# Patient Record
Sex: Female | Born: 1965 | Race: White | Hispanic: No | Marital: Married | Smoking: Never smoker
Health system: Southern US, Community
[De-identification: ages and names within clinical notes are randomized; demographics above are authoritative.]

## PROBLEM LIST (undated history)

## (undated) DIAGNOSIS — Z8632 Personal history of gestational diabetes: Secondary | ICD-10-CM

## (undated) DIAGNOSIS — R002 Palpitations: Secondary | ICD-10-CM

## (undated) DIAGNOSIS — K810 Acute cholecystitis: Secondary | ICD-10-CM

## (undated) DIAGNOSIS — Z87410 Personal history of cervical dysplasia: Secondary | ICD-10-CM

## (undated) DIAGNOSIS — R0602 Shortness of breath: Secondary | ICD-10-CM

## (undated) DIAGNOSIS — M94269 Chondromalacia, unspecified knee: Secondary | ICD-10-CM

## (undated) DIAGNOSIS — A071 Giardiasis [lambliasis]: Secondary | ICD-10-CM

## (undated) DIAGNOSIS — I10 Essential (primary) hypertension: Secondary | ICD-10-CM

## (undated) DIAGNOSIS — Z86018 Personal history of other benign neoplasm: Secondary | ICD-10-CM

## (undated) HISTORY — PX: GYNECOLOGIC CRYOSURGERY: SHX857

## (undated) HISTORY — DX: Acute cholecystitis: K81.0

## (undated) HISTORY — DX: Giardiasis (lambliasis): A07.1

## (undated) HISTORY — DX: Palpitations: R00.2

## (undated) HISTORY — PX: COLPOSCOPY: SHX161

## (undated) HISTORY — DX: Shortness of breath: R06.02

## (undated) HISTORY — DX: Essential (primary) hypertension: I10

---

## 2000-04-02 ENCOUNTER — Inpatient Hospital Stay (HOSPITAL_COMMUNITY): Admission: AD | Admit: 2000-04-02 | Discharge: 2000-04-02 | Payer: Self-pay | Admitting: Obstetrics & Gynecology

## 2000-04-02 ENCOUNTER — Encounter: Payer: Self-pay | Admitting: Obstetrics & Gynecology

## 2002-11-15 ENCOUNTER — Other Ambulatory Visit: Admission: RE | Admit: 2002-11-15 | Discharge: 2002-11-15 | Payer: Self-pay | Admitting: Obstetrics and Gynecology

## 2002-11-24 ENCOUNTER — Ambulatory Visit (HOSPITAL_COMMUNITY): Admission: RE | Admit: 2002-11-24 | Discharge: 2002-11-24 | Payer: Self-pay | Admitting: Obstetrics and Gynecology

## 2002-11-24 ENCOUNTER — Encounter: Payer: Self-pay | Admitting: Obstetrics and Gynecology

## 2003-11-17 ENCOUNTER — Other Ambulatory Visit: Admission: RE | Admit: 2003-11-17 | Discharge: 2003-11-17 | Payer: Self-pay | Admitting: Obstetrics and Gynecology

## 2003-12-29 ENCOUNTER — Observation Stay (HOSPITAL_COMMUNITY): Admission: RE | Admit: 2003-12-29 | Discharge: 2003-12-30 | Payer: Self-pay | Admitting: Obstetrics and Gynecology

## 2003-12-29 ENCOUNTER — Encounter (INDEPENDENT_AMBULATORY_CARE_PROVIDER_SITE_OTHER): Payer: Self-pay | Admitting: Specialist

## 2003-12-29 HISTORY — PX: MYOMECTOMY ABDOMINAL APPROACH: SUR870

## 2004-02-13 ENCOUNTER — Ambulatory Visit (HOSPITAL_COMMUNITY): Admission: RE | Admit: 2004-02-13 | Discharge: 2004-02-13 | Payer: Self-pay | Admitting: Obstetrics and Gynecology

## 2004-05-20 ENCOUNTER — Ambulatory Visit (HOSPITAL_COMMUNITY): Admission: RE | Admit: 2004-05-20 | Discharge: 2004-05-20 | Payer: Self-pay | Admitting: Neurology

## 2005-05-17 ENCOUNTER — Other Ambulatory Visit: Admission: RE | Admit: 2005-05-17 | Discharge: 2005-05-17 | Payer: Self-pay | Admitting: Gynecology

## 2005-05-22 ENCOUNTER — Inpatient Hospital Stay (HOSPITAL_COMMUNITY): Admission: AD | Admit: 2005-05-22 | Discharge: 2005-05-22 | Payer: Self-pay | Admitting: Gynecology

## 2005-09-13 ENCOUNTER — Encounter: Admission: RE | Admit: 2005-09-13 | Discharge: 2005-09-13 | Payer: Self-pay | Admitting: Gynecology

## 2005-11-12 ENCOUNTER — Inpatient Hospital Stay (HOSPITAL_COMMUNITY): Admission: AD | Admit: 2005-11-12 | Discharge: 2005-11-12 | Payer: Self-pay | Admitting: Gynecology

## 2005-11-16 ENCOUNTER — Inpatient Hospital Stay (HOSPITAL_COMMUNITY): Admission: RE | Admit: 2005-11-16 | Discharge: 2005-11-19 | Payer: Self-pay | Admitting: Gynecology

## 2005-11-16 ENCOUNTER — Encounter (INDEPENDENT_AMBULATORY_CARE_PROVIDER_SITE_OTHER): Payer: Self-pay | Admitting: Specialist

## 2005-11-20 ENCOUNTER — Encounter: Admission: RE | Admit: 2005-11-20 | Discharge: 2005-12-19 | Payer: Self-pay | Admitting: Gynecology

## 2005-12-20 ENCOUNTER — Encounter: Admission: RE | Admit: 2005-12-20 | Discharge: 2006-01-19 | Payer: Self-pay | Admitting: Gynecology

## 2005-12-28 ENCOUNTER — Other Ambulatory Visit: Admission: RE | Admit: 2005-12-28 | Discharge: 2005-12-28 | Payer: Self-pay | Admitting: Gynecology

## 2006-01-20 ENCOUNTER — Encounter: Admission: RE | Admit: 2006-01-20 | Discharge: 2006-02-14 | Payer: Self-pay | Admitting: Gynecology

## 2006-03-17 ENCOUNTER — Emergency Department (HOSPITAL_COMMUNITY): Admission: EM | Admit: 2006-03-17 | Discharge: 2006-03-17 | Payer: Self-pay | Admitting: Emergency Medicine

## 2006-05-17 ENCOUNTER — Ambulatory Visit (HOSPITAL_COMMUNITY): Admission: RE | Admit: 2006-05-17 | Discharge: 2006-05-17 | Payer: Self-pay | Admitting: Neurology

## 2007-02-06 ENCOUNTER — Other Ambulatory Visit: Admission: RE | Admit: 2007-02-06 | Discharge: 2007-02-06 | Payer: Self-pay | Admitting: Gynecology

## 2007-03-29 ENCOUNTER — Encounter: Admission: RE | Admit: 2007-03-29 | Discharge: 2007-03-29 | Payer: Self-pay | Admitting: Internal Medicine

## 2008-06-10 ENCOUNTER — Encounter: Admission: RE | Admit: 2008-06-10 | Discharge: 2008-06-10 | Payer: Self-pay | Admitting: Internal Medicine

## 2008-06-12 ENCOUNTER — Encounter: Payer: Self-pay | Admitting: Obstetrics and Gynecology

## 2008-06-12 ENCOUNTER — Ambulatory Visit: Payer: Self-pay | Admitting: Obstetrics and Gynecology

## 2008-06-12 ENCOUNTER — Other Ambulatory Visit: Admission: RE | Admit: 2008-06-12 | Discharge: 2008-06-12 | Payer: Self-pay | Admitting: Obstetrics and Gynecology

## 2008-08-12 ENCOUNTER — Ambulatory Visit: Payer: Self-pay | Admitting: Gynecology

## 2009-06-23 ENCOUNTER — Encounter: Admission: RE | Admit: 2009-06-23 | Discharge: 2009-06-23 | Payer: Self-pay | Admitting: Internal Medicine

## 2009-07-06 DIAGNOSIS — J309 Allergic rhinitis, unspecified: Secondary | ICD-10-CM | POA: Insufficient documentation

## 2009-07-06 DIAGNOSIS — I059 Rheumatic mitral valve disease, unspecified: Secondary | ICD-10-CM | POA: Insufficient documentation

## 2009-07-06 DIAGNOSIS — R002 Palpitations: Secondary | ICD-10-CM | POA: Insufficient documentation

## 2009-07-06 DIAGNOSIS — G2581 Restless legs syndrome: Secondary | ICD-10-CM | POA: Insufficient documentation

## 2009-07-06 DIAGNOSIS — G43909 Migraine, unspecified, not intractable, without status migrainosus: Secondary | ICD-10-CM | POA: Insufficient documentation

## 2009-07-06 DIAGNOSIS — R32 Unspecified urinary incontinence: Secondary | ICD-10-CM | POA: Insufficient documentation

## 2009-07-06 DIAGNOSIS — E559 Vitamin D deficiency, unspecified: Secondary | ICD-10-CM | POA: Insufficient documentation

## 2009-11-17 ENCOUNTER — Ambulatory Visit: Payer: Self-pay | Admitting: Obstetrics and Gynecology

## 2009-11-17 ENCOUNTER — Other Ambulatory Visit: Admission: RE | Admit: 2009-11-17 | Discharge: 2009-11-17 | Payer: Self-pay | Admitting: Obstetrics and Gynecology

## 2009-12-08 ENCOUNTER — Ambulatory Visit: Payer: Self-pay | Admitting: Obstetrics and Gynecology

## 2010-01-19 ENCOUNTER — Ambulatory Visit: Payer: Self-pay | Admitting: Obstetrics and Gynecology

## 2010-04-02 ENCOUNTER — Ambulatory Visit
Admission: RE | Admit: 2010-04-02 | Discharge: 2010-04-02 | Payer: Self-pay | Source: Home / Self Care | Attending: Gynecology | Admitting: Gynecology

## 2010-08-06 NOTE — H&P (Signed)
NAME:  Martha Vincent, Martha Vincent             ACCOUNT NO.:  [id number]   MEDICAL RECORD NO.:  [id number]          PATIENT TYPE:  INP   LOCATION:                                [facility]   PHYSICIAN:  Ivor Costa. Farrel Gobble, M.D.      DATE OF BIRTH:   DATE OF ADMISSION:  11/16/2005  DATE OF DISCHARGE:                                HISTORY & PHYSICAL   CHIEF COMPLAINT:  A 39-week pregnancy with a prior history of myomectomy in  2005, for elective cesarean section.   HISTORY OF PRESENT ILLNESS:  The patient is a 45 year old, G3 P0-0-2-0, with  a LMP of May 06, 2004, estimated date of confinement of November 25, 2005, estimated gestational age of [redacted] weeks, with a history of prior  myomectomy, for an elective primary cesarean section.  Her pregnancy was  complicated by advanced maternal age for which the patient had first-  trimester screening which was normal.  Also complicated by gestational  diabetes for which the patient is taking glyburide.  Her fingersticks have  been normal.  She has been followed with NSTs and AFIs, all of which have  been assuring, and presents now for an elective cesarean section.   PRENATAL LABORATORIES:  She is O positive.  Antibody negative.  RPR  nonreactive. Rubella immune.  Hepatitis B surface antigen nonreactive.  HIV  nonreactive.  GBS not available.  Refer to the [facility].   PHYSICAL EXAMINATION:  She is a well-appearing gravida in no acute distress.  HEART:  Regular rate.  LUNGS:  Clear to auscultation.  ABDOMEN:  Gravid, soft and nontender.  Fetal heart tones were auscultated.  VAGINAL EXAM:  Deferred.  EXTREMITIES:  Negative.   ASSESSMENT:  A 39-week pregnancy with history of prior myomectomy with a  septal resection in 2006 for a primary cesarean section.  Risks and benefits  of the procedure were reviewed.  The risks of bleeding, infection, damage to  underlying bowel, bladder or ureters were similarly reviewed.  The patient  was given a  prescription for Tylox 1-2 q.6 h p.r.n. pain for postoperative  pain management and will present in the morning of November 16, 2005 for  surgery.      Ivor Costa. Farrel Gobble, M.D.  Electronically Signed     THL/MEDQ  D:  11/15/2005  T:  11/15/2005  Job:  [id number]

## 2010-08-06 NOTE — Op Note (Signed)
NAME:  Martha Vincent, Martha Vincent          ACCOUNT NO.:  [id number]   MEDICAL RECORD NO.:  [id number]          PATIENT TYPE:  INP   LOCATION:  9148                          [facility]ILITY:  [facility]   PHYSICIAN:  Ivor Costa. Farrel Gobble, M.D. DATE OF BIRTH:  Nov 26, 1965   DATE OF PROCEDURE:  11/16/2005  DATE OF DISCHARGE:  11/12/2005                                 OPERATIVE REPORT   PREOPERATIVE DIAGNOSIS:  1. 39 weeks intrauterine pregnancy.  2. Prior myomectomy.  3. Gestational diabetes.   POSTOPERATIVE DIAGNOSIS:  1. 39 weeks intrauterine pregnancy.  2. Prior myomectomy.  3. Gestational diabetes.  4. Breech presentation.   PROCEDURE:  Primary cesarean section, low flap, transverse.   SURGEON:  Ivor Costa. Farrel Gobble, M.D.   ASSISTANTMarcial Pacas P. Fontaine, M.D.   ANESTHESIA:  Spinal.   IV FLUIDS:  3500 mL lactated Ringer's.   ESTIMATED BLOOD LOSS:  400 mL.   URINE OUTPUT:  100 mL of clear urine.   FINDINGS:  A viable female delivered double footling breech, clear amniotic  fluid, Apgars 9/9, birth weight 5 pounds 13 ounces, normal uterus, tubes and  ovaries.   PATHOLOGY:  Placenta.   COMPLICATIONS:  None.   PROCEDURE:  The patient was taken to the operating room.  Spinal anesthesia  was induced.  The patient was placed in the supine position with left  lateral displacement, prepped and draped in the usual sterile fashion.  A  Pfannenstiel skin incision was made with the scalpel and carried through the  underlying layer of fascia with the Bovie.  The fascia was scored in the  midline and extended laterally with the Bovie.  The inferior aspect of the  fascial incision was grasped with Kochers, the underlying rectus muscles  were dissected off by blunt and sharp dissection.  In a similar fashion, the  superior aspect of the incision was grasped with Kochers and the underlying  rectus muscles were dissected off. The rectus muscles were separated in the  midline.  The peritoneum was identified  and entered bluntly.  The peritoneal  incision was then extended superiorly and inferiorly with good visualization  of the underlying bowel and bladder.  Of note, the bladder was markedly  scarred to the peritoneum and we were able to tease it off bluntly and  sharply in order to extend the incision inferiorly.  The bladder blade was  then inserted.  The vesicouterine peritoneum was identified, tented up and  entered sharply with Metzenbaum scissors.  The bladder flap was created  digitally.  The bladder blade was then reinserted. The lower uterine segment  was incised in a transverse fashion with the scalpel.  The cavity was  entered bluntly and extended bluntly.  The infant was then delivered from  double footling breech presentation using the usual maneuvers.  The cord was  cut and clamped and the infant handed off to waiting pediatricians.  Cord  bloods were obtained.  The uterus was massaged, the placenta was allowed to  separate naturally. The uterus was then cleared of all clots and debris.  The uterine incision was repaired with a running locked layer  of 0 chromic  and a second suture was used for imbrication.  The pelvis was then irrigated  with copious amounts of warm saline.  The adnexa were inspected to be  unremarkable.  The incision was felt to be hemostatic as was the bladder,  peritoneum, and muscle.  The fascia was then closed with 0 Vicryl in a  running fashion.  The subcu was irrigated.  The skin was closed with 4-0  Vicryl on a Mellody Dance.  Because of the adhesive allergy, Dermabond was placed  for a dressing.  The patient tolerated the procedure well.  Sponge, lap, and  needle counts were correct x2.  She was transferred to the PACU in stable  condition.      Ivor Costa. Farrel Gobble, M.D.  Electronically Signed     THL/MEDQ  D:  11/16/2005  T:  11/16/2005  Job:  [id number]

## 2010-08-06 NOTE — Discharge Summary (Signed)
NAME:  Martha Vincent, Martha Vincent          ACCOUNT NO.:  [id number]   MEDICAL RECORD NO.:  [id number]          PATIENT TYPE:  INP   LOCATION:  9148                          [facility]   PHYSICIAN:  Ivor Costa. Farrel Gobble, M.D. DATE OF BIRTH:  April 14, 1965   DATE OF ADMISSION:  11/16/2005  DATE OF DISCHARGE:  11/19/2005                                 DISCHARGE SUMMARY   PRINCIPAL DIAGNOSIS:  Term pregnancy.   ADDITIONAL DIAGNOSES:  1. Prior myomectomy.  2. Gestational diabetes.   PRINCIPAL PROCEDURE:  Primary cesarean section low flap transverse.  Refer  to the dictated H&P.   The patient presented the morning of November 16, 2005 underwent a primary  cesarean section low flap transverse under spinal anesthesia for delivery of  a viable female delivered via the double footling breech presentation with  Apgar's 9 and 9, birth weight 513, normal uterus, tubes and ovaries.  The  patient was transferred from the OR to the recovery room in the postpartum  floor in due fashion.  Her postpartum course was unremarkable.  The patient  remained afebrile and vitals stable throughout.  She was breast-feeding.  Pain was well controlled and tolerating regular diet at the time of  discharge.  The patient was without any concerns.   On examination, she was well appearing.  Her abdomen was soft, nontender.  Incision was clean, dry, intact.  With suture, Dermabond was placed over  which because of the allergy to tape.  There was minimal erythema from the  OR drape.  Extremities were nontender.   POSTOPERATIVE LABS:  Her hemoglobin was 10.8.  Her hematocrit was 31.  Her  platelets were 258,000.  White count of 14.2.   The patient was discharged home in stable condition.  She had been given a  prescription for Tylox at her preop and was instructed that she may also use  over-the-counter Motrin.      Ivor Costa. Farrel Gobble, M.D.  Electronically Signed     THL/MEDQ  D:  11/19/2005  T:  11/21/2005  Job:   [id number]

## 2010-08-06 NOTE — Op Note (Signed)
NAME:  Martha Vincent, Martha Vincent             ACCOUNT NO.:  [id number]   MEDICAL RECORD NO.:  [id number]          PATIENT TYPE:  INP   LOCATION:  9399                          [facility]ILITY[facility]   PHYSICIAN:  Daniel L. Gottsegen, M.D.DATE OF BIRTH:  1965-08-09   DATE OF PROCEDURE:  12/29/2003  DATE OF DISCHARGE:                                 OPERATIVE REPORT   PREOPERATIVE DIAGNOSES:  Enlarging fibroid with menorrhagia.   POSTOPERATIVE DIAGNOSES:  Enlarging fibroid with menorrhagia.   OPERATION:  Multiple myomectomy.   SURGEON:  Daniel L. Eda Paschal, M.D.   FIRST ASSISTANT:  Timothy P. Fontaine, M.D.   ANESTHESIA:  General endotracheal.   FINDINGS:  At the time of surgery, the patient had a very large fibroid at  the top of the fundus of about 6 cm. It went into the myometrium although  nowhere near the endometrial cavity itself. Both fallopian tubes were normal  with luxuriant fimbria, both tubes were patent to dye after the myomectomy  had been performed. There was one small are of endometriosis near the  vesicouterine fold to peritoneum on the left of just several millimeters. It  was pigmented. This was the only area of endometriosis. There were no pelvic  adhesions. The ovaries appeared normal as did the cul-de-sac.   DESCRIPTION OF PROCEDURE:  After adequate general endotracheal anesthesia,  the patient was placed in the supine position, prepped and draped in the  usual sterile manner. A Foley catheter was inserted into the patient's  bladder, a pediatric Foley was inserted into the uterus. A Pfannenstiel  incision was made, the fascia was opened transversely, the peritoneum was  entered vertically.  Subcutaneous bleeders were clamped and bovied as  encountered. When the peritoneal cavity was opened, the above findings were  noted. A dilute solution of Pitressin was injected at the top of the fundus  and then a vertical incision was made full range of motion the top of the  fundus  towards the lower uterine segment and through this the myoma was  removed. Although it was not deep in the myometrium, it did enter the  myometrium and Dr. Audie Box who was the assistant felt that he would do a  cesarean section when she conceived. After the myoma had been removed, the  myometrium is brought together with a running locking #0 Vicryl, only one  layer was necessary to close this and then the serosa was closed with 2-0  Prolene bearing the knots. The second myoma was removed, it was on the  posterior serosal wall of the fundus, it was about 1 cm, it was easily  excised. It really did not enter the myometrium to any extent and the defect  was closed with two interrupted's of 2-0 Prolene once again bearing the  knots. Systematic evaluation of the pelvis was made to be sure that there  was no other disease other than then one area of endometriosis described  above. Indigo carmine was introduced through the pediatric Foley and spilled  freely and promptly from both fallopian tubes.  The one area of  endometriosis was cauterized until it was  completely gone. Copious  irrigation was done with sterile saline. All of this was removed. Two  sponge, needle and instrument counts were correct. The peritoneum was closed  with a  running #0 Vicryl. The fascia was closed with two running #0 Vicryl and the  skin was closed with a 3-0 plain subcuticular suture. Estimated blood loss  for the entire procedure was less than 100 mL with none replaced. The  patient tolerated the procedure well and left the operating room in  satisfactory condition.      DLG/MEDQ  D:  12/29/2003  T:  12/29/2003  Job:  [id number]

## 2010-08-06 NOTE — H&P (Signed)
NAME:  Martha Vincent, Martha Vincent             ACCOUNT NO.:  [id number]   MEDICAL RECORD NO.:  [id number]          PATIENT TYPE:  INP   LOCATION:  NA                            [facility]   PHYSICIAN:  Daniel L. Gottsegen, M.D.DATE OF BIRTH:  15-Oct-1965   DATE OF ADMISSION:  DATE OF DISCHARGE:                                HISTORY & PHYSICAL   DATE OF SCHEDULED SURGERY:  Monday, December 29, 2003 at 7:30 a.m.   CHIEF COMPLAINT:  Enlarging fibroid.   HISTORY OF PRESENT ILLNESS:  The patient is a 45 year old gravida 2 para 0  AB 2 who had presented to the office with severe menorrhagia associated with  anemia.  On examination, she had an abnormal pelvic examination and she  underwent an ultrasound.  She had had a previous fibroid and on ultrasound  on our office it had increased in size to 5.1 x 3.7 x 3.5 cm.  As a result  of this enlarged fibroid with menorrhagia, as well as her desire to  conceive, and the thought that with the fibroid being as large as it is now  this would be an issue with pregnancy, she enters the hospital for abdominal  myomectomy.  She is aware of the small but real risk of requiring abdominal  hysterectomy.   PAST MEDICAL HISTORY:  No serious illnesses.   MEDICATIONS:  Prozac on a p.r.n. basis.   ALLERGIES:  CODEINE which causes itching.   FAMILY HISTORY:  Basically negative.   REVIEW OF SYSTEMS:  HEENT:  Negative.  CARDIAC:  Negative.  RESPIRATORY:  Negative.  GI:  Negative.  MUSCULOSKELETAL:  Reveals a history of right heel  spur.  GU:  Negative.  NEUROLOGIC, PSYCHIATRIC: Negative.  ALLERGIC,  IMMUNOLOGIC, LYMPHATIC, AND ENDOCRINE:  Negative.   SOCIAL HISTORY:  The patient drinks alcohol.  She is not a smoker.  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   PHYSICAL EXAMINATION:  GENERAL:  The patient is a well-developed and well-  nourished female in no acute distress.  VITAL SIGNS:  Her blood pressure is 122/76, her pulse is 80 and regular,  respirations are 16 and nonlabored, she is afebrile.  HEENT:  All within normal limits.  NECK:  Supple, trachea in the midline.  Thyroid is not enlarged.  LUNGS:  Clear to P&A.  HEART:  No thrills, heaves, or murmurs.  BREASTS:  No masses.  ABDOMEN:  Soft without guarding, rebound, or masses.  PELVIC:  External and vaginal is normal, cervix is clean.  Pap smear shows  no atypia.  Uterus is enlarged by a small myoma that on ultrasound is about  5 cm.  Adnexa are palpably normal.  Rectal is negative.  EXTREMITIES:  Within normal limits.   ADMISSION IMPRESSION:  Leiomyoma, enlarging, with menorrhagia.   PLAN:  Myomectomy.      DLG/MEDQ  D:  12/25/2003  T:  12/25/2003  Job:  [id number]

## 2011-04-27 ENCOUNTER — Other Ambulatory Visit: Payer: Self-pay | Admitting: Obstetrics and Gynecology

## 2011-04-27 ENCOUNTER — Telehealth: Payer: Self-pay | Admitting: *Deleted

## 2011-04-27 DIAGNOSIS — N644 Mastodynia: Secondary | ICD-10-CM

## 2011-04-27 NOTE — Telephone Encounter (Signed)
Pt called stating she has some breast tenderness on rt side. Pt called [facility] to schedule diag. Mammogram and was told to call here and get order from you and place in epic system. Okay to place order? Please advise

## 2011-04-27 NOTE — Telephone Encounter (Signed)
Okay to place order? °

## 2011-04-28 NOTE — Telephone Encounter (Signed)
Pt order in computer, left message in pt vm with date Feb 14 th at 8:50 am.

## 2011-05-05 ENCOUNTER — Ambulatory Visit
Admission: RE | Admit: 2011-05-05 | Discharge: 2011-05-05 | Disposition: A | Discharge status: Home / Self Care | Payer: PRIVATE HEALTH INSURANCE | Source: Ambulatory Visit | Attending: Obstetrics and Gynecology | Admitting: Obstetrics and Gynecology

## 2011-05-05 DIAGNOSIS — N644 Mastodynia: Secondary | ICD-10-CM

## 2011-08-07 ENCOUNTER — Emergency Department (HOSPITAL_COMMUNITY): Payer: PRIVATE HEALTH INSURANCE

## 2011-08-07 ENCOUNTER — Emergency Department (HOSPITAL_COMMUNITY)
Admission: EM | Admit: 2011-08-07 | Discharge: 2011-08-07 | Disposition: A | Discharge status: Home / Self Care | Payer: PRIVATE HEALTH INSURANCE | Attending: Emergency Medicine | Admitting: Emergency Medicine

## 2011-08-07 ENCOUNTER — Encounter (HOSPITAL_COMMUNITY): Payer: Self-pay

## 2011-08-07 DIAGNOSIS — R42 Dizziness and giddiness: Secondary | ICD-10-CM | POA: Insufficient documentation

## 2011-08-07 DIAGNOSIS — N390 Urinary tract infection, site not specified: Secondary | ICD-10-CM | POA: Insufficient documentation

## 2011-08-07 DIAGNOSIS — R51 Headache: Secondary | ICD-10-CM

## 2011-08-07 DIAGNOSIS — J40 Bronchitis, not specified as acute or chronic: Secondary | ICD-10-CM

## 2011-08-07 LAB — BASIC METABOLIC PANEL
CO2: 23 mEq/L (ref 19–32)
Creatinine, Ser: 0.78 mg/dL (ref 0.50–1.10)
GFR calc Af Amer: >90 mL/min (ref >90)
Glucose, Bld: 121 mg/dL — ABNORMAL HIGH (ref 70–99)

## 2011-08-07 LAB — URINE MICROSCOPIC-ADD ON

## 2011-08-07 LAB — DIFFERENTIAL
Basophils Absolute: 0 10*3/uL (ref 0.0–0.1)
Basophils Relative: 0 % (ref 0–1)
Eosinophils Absolute: 0.1 10*3/uL (ref 0.0–0.7)
Lymphocytes Relative: 18 % (ref 12–46)
Monocytes Absolute: 0.5 10*3/uL (ref 0.1–1.0)
Monocytes Relative: 5 % (ref 3–12)
Neutrophils Relative %: 76 % (ref 43–77)

## 2011-08-07 LAB — URINALYSIS, ROUTINE W REFLEX MICROSCOPIC
Bilirubin Urine: NEGATIVE
Ketones, ur: NEGATIVE mg/dL
Nitrite: NEGATIVE
Protein, ur: 30 mg/dL — AB
Specific Gravity, Urine: 1.033 — ABNORMAL HIGH (ref 1.005–1.030)
Urobilinogen, UA: 0.2 mg/dL (ref 0.0–1.0)

## 2011-08-07 LAB — CBC
Hemoglobin: 13.9 g/dL (ref 12.0–15.0)
MCH: 31.1 pg (ref 26.0–34.0)
RBC: 4.47 MIL/uL (ref 3.87–5.11)
WBC: 10.3 10*3/uL (ref 4.0–10.5)

## 2011-08-07 MED ORDER — MORPHINE SULFATE 4 MG/ML IJ SOLN
4.0000 mg | Freq: Once | INTRAMUSCULAR | Status: AC
  Administered 2011-08-07: 4 mg via INTRAVENOUS
  Filled 2011-08-07: qty 1

## 2011-08-07 MED ORDER — HYOSCYAMINE SULFATE 0.125 MG PO TABS
0.2500 mg | ORAL_TABLET | Freq: Once | ORAL | Status: AC
  Administered 2011-08-07: 0.25 mg via ORAL
  Filled 2011-08-07: qty 2

## 2011-08-07 MED ORDER — ALBUTEROL SULFATE HFA 108 (90 BASE) MCG/ACT IN AERS
2.0000 | INHALATION_SPRAY | RESPIRATORY_TRACT | Status: DC | PRN
  Administered 2011-08-07: 2 via RESPIRATORY_TRACT
  Filled 2011-08-07: qty 6.7

## 2011-08-07 MED ORDER — SULFAMETHOXAZOLE-TRIMETHOPRIM 800-160 MG PO TABS: 1.0000 | ORAL_TABLET | Freq: Two times a day (BID) | ORAL | Status: AC

## 2011-08-07 MED ORDER — KETOROLAC TROMETHAMINE 30 MG/ML IJ SOLN
30.0000 mg | Freq: Once | INTRAMUSCULAR | Status: AC
  Administered 2011-08-07: 30 mg via INTRAVENOUS
  Filled 2011-08-07: qty 1

## 2011-08-07 MED ORDER — SODIUM CHLORIDE 0.9 % IV BOLUS (SEPSIS)
1000.0000 mL | Freq: Once | INTRAVENOUS | Status: AC
  Administered 2011-08-07: 1000 mL via INTRAVENOUS

## 2011-08-07 MED ORDER — SODIUM CHLORIDE 0.9 % IV SOLN
Freq: Once | INTRAVENOUS | Status: AC
  Administered 2011-08-07: 20 mL/h via INTRAVENOUS

## 2011-08-07 MED ORDER — ONDANSETRON HCL 4 MG PO TABS: 4.0000 mg | ORAL_TABLET | Freq: Four times a day (QID) | ORAL | Status: AC

## 2011-08-07 MED ORDER — DEXTROSE 5 % IV SOLN
1.0000 g | Freq: Once | INTRAVENOUS | Status: AC
  Administered 2011-08-07: 1 g via INTRAVENOUS
  Filled 2011-08-07: qty 10

## 2011-08-07 MED ORDER — ONDANSETRON HCL 4 MG/2ML IJ SOLN
4.0000 mg | Freq: Once | INTRAMUSCULAR | Status: AC
  Administered 2011-08-07: 4 mg via INTRAVENOUS
  Filled 2011-08-07: qty 2

## 2011-08-07 NOTE — ED Notes (Signed)
Discharge instructions reviewed w/ pt., verbalizes understanding. Two prescriptions provided at discharge. 

## 2011-08-07 NOTE — ED Notes (Signed)
Pt in from home with n/v/d states onset yesterday states worsening today pt states recent travel to [place] where she was treated with cipro for severe cough  Pt also c/o headache

## 2011-08-07 NOTE — ED Notes (Signed)
Pt also c/o headache and neck stiffness

## 2011-08-07 NOTE — ED Provider Notes (Signed)
History     CSN: [id number]  Arrival date & time 08/07/11  0747   First MD Initiated Contact with Patient 08/07/11 0801      Chief Complaint  Patient presents with  . Nausea  . Emesis  . Diarrhea    (Consider location/radiation/quality/duration/timing/severity/associated sxs/prior treatment) Patient is a 46 y.o. female presenting with vomiting and diarrhea. The history is provided by the patient and the spouse.  Emesis  Associated symptoms include abdominal pain, chills, cough, diarrhea and headaches. Pertinent negatives include no fever.  Diarrhea The primary symptoms include abdominal pain, nausea, vomiting and diarrhea. Primary symptoms do not include fever or rash.  The illness is also significant for chills.  Pt returned from [place] 4 d ago.  She was only in the city.  She developed bronchitis there from the air pollution.   She took cipro and robutussin.   After she returned, she developed n/v, liquid diarrhea.  She had chills but no fever.  She says there was blood streaks in her vomit.  She denies blood in her stool.  Her husband and daughter, both traveled as well, are not sick.    History reviewed. No pertinent past medical history.  History reviewed. No pertinent past surgical history.  No family history on file.  History  Substance Use Topics  . Smoking status: Never Smoker   . Smokeless tobacco: Not on file  . Alcohol Use: Yes    OB History    Grav Para Term Preterm Abortions TAB SAB Ect Mult Living                  Review of Systems  Constitutional: Positive for chills. Negative for fever.  Respiratory: Positive for cough. Negative for chest tightness and shortness of breath.   Cardiovascular: Negative for chest pain.  Gastrointestinal: Positive for nausea, vomiting, abdominal pain and diarrhea. Negative for blood in stool.  Skin: Negative for rash.  Neurological: Positive for dizziness, light-headedness and headaches.  Psychiatric/Behavioral: Negative  for confusion.  All other systems reviewed and are negative.    Allergies  Coconut flavor  Home Medications   Current Outpatient Rx  Name Route Sig Dispense Refill  . IBUPROFEN 200 MG PO TABS Oral Take 600 mg by mouth every 6 (six) hours as needed.      BP 187/94  Temp(Src) 98 F (36.7 C) (Oral)  Resp 18  SpO2 100%  Physical Exam  Nursing note and vitals reviewed. Constitutional: She is oriented to person, place, and time. She appears well-developed and well-nourished. No distress.  HENT:  Head: Normocephalic and atraumatic.  Eyes: Conjunctivae are normal.  Neck: Normal range of motion. Neck supple.  Cardiovascular: Normal rate.   No murmur heard. Pulmonary/Chest: Effort normal. No respiratory distress. She has no wheezes. She has no rales.  Abdominal: Soft. She exhibits no distension. There is tenderness. There is no rebound and no guarding.       Diffuse ttp  Musculoskeletal: Normal range of motion.  Neurological: She is alert and oriented to person, place, and time.  Skin: Skin is warm and dry.  Psychiatric: She has a normal mood and affect. Thought content normal.    ED Course  Procedures (including critical care time)  Labs Reviewed  DIFFERENTIAL - Abnormal; Notable for the following:    Neutro Abs 7.9 (*)    All other components within normal limits  BASIC METABOLIC PANEL - Abnormal; Notable for the following:    Glucose, Bld 121 (*)  All other components within normal limits  CBC  URINALYSIS, ROUTINE W REFLEX MICROSCOPIC   Dg Chest 2 View  08/07/2011  *RADIOLOGY REPORT*  Clinical Data: Cough and fever.  Recent history of a trip to [place].  CHEST - 2 VIEW  Comparison: No priors.  Findings: Lung volumes are normal.  No consolidative airspace disease.  No pleural effusions.  Pulmonary vasculature is normal. There is very mild thickening of the central bronchi throughout the lungs bilaterally.  Pulmonary vasculature and the cardiomediastinal silhouette are  within normal limits.  IMPRESSION: 1.  Mild thickening of the central bronchi throughout the lungs bilaterally.  This could suggest a mild bronchitis.  Original Report Authenticated By: Florencia Reasons, M.D.     No diagnosis found.  9:47 AM Says ha improved, but not gone.  Nausea resolved but itches now.   Explained findings.    10:54 AM sxs resolved  MDM  uti Headache bronchitis        Cheri Guppy, MD 08/07/11 1054

## 2011-08-07 NOTE — Discharge Instructions (Signed)
Chest x-ray does not show signs of pneumonia.  You have signs of urinary tract infection.  Use Bactrim for infection.  Use Zofran for nausea and vomiting.  Take Tylenol or Motrin for fever or pain.  Follow up with your Dr. if your symptoms.  Last more than 3-4 days.  Return for worse or uncontrolled symptoms

## 2011-09-19 ENCOUNTER — Telehealth: Payer: Self-pay

## 2011-09-19 NOTE — Telephone Encounter (Signed)
C-O SEVERE HOT FLASHES AND SWEATING AND NOT SLEEPING WELL DUE TO THESE SYMPTOMS. STATES SHE DID WELL ON HRT PATCHES IN THE PAST EXCEPT THEY CAUSED HER TO BLEED. CALL IN RX? OR DOES SHE NEED LABS OR AN APPT WITH YOU FIRST?

## 2011-09-21 NOTE — Telephone Encounter (Signed)
DR. Reece Agar HAD ME CALL HER OFFICE TO TRY TO TALK TO DR. Hosking DIRECTLY.

## 2011-09-22 ENCOUNTER — Telehealth: Payer: Self-pay | Admitting: Obstetrics and Gynecology

## 2011-09-22 ENCOUNTER — Other Ambulatory Visit: Payer: Self-pay | Admitting: Obstetrics and Gynecology

## 2011-09-22 DIAGNOSIS — Z780 Asymptomatic menopausal state: Secondary | ICD-10-CM

## 2011-09-22 MED ORDER — ESTRADIOL 0.025 MG/24HR TD PTTW: 1.0000 | MEDICATED_PATCH | TRANSDERMAL | Status: DC

## 2011-09-22 NOTE — Telephone Encounter (Signed)
Patient is having night sweats which wake her up nightly. We started her on vivelle  dot patch .025mg  two times a week. She has a mirena iud so she does not need  Progesterone. She will get [facility] drawn before she starts patches.

## 2011-09-23 ENCOUNTER — Other Ambulatory Visit: Payer: PRIVATE HEALTH INSURANCE

## 2011-09-23 DIAGNOSIS — Z780 Asymptomatic menopausal state: Secondary | ICD-10-CM

## 2011-12-01 ENCOUNTER — Telehealth: Payer: Self-pay | Admitting: *Deleted

## 2011-12-01 NOTE — Telephone Encounter (Signed)
rx called in. Left on voicemail to make annual appointment.

## 2011-12-01 NOTE — Telephone Encounter (Signed)
Testosterone cream 2%-15 g tube. Applied to the clitoris. She can get at either [facility] or [facility]. Remind her time for annual exam.

## 2011-12-01 NOTE — Telephone Encounter (Signed)
Pt calling requesting rx for testosterone cream 2% . Pt overdue to annual. Will remind her to schedule. Okay to fill?

## 2011-12-12 ENCOUNTER — Encounter: Payer: Self-pay | Admitting: Gynecology

## 2011-12-12 DIAGNOSIS — D219 Benign neoplasm of connective and other soft tissue, unspecified: Secondary | ICD-10-CM | POA: Insufficient documentation

## 2011-12-13 ENCOUNTER — Encounter: Payer: Self-pay | Admitting: Obstetrics and Gynecology

## 2011-12-13 ENCOUNTER — Ambulatory Visit (INDEPENDENT_AMBULATORY_CARE_PROVIDER_SITE_OTHER): Payer: PRIVATE HEALTH INSURANCE | Admitting: Obstetrics and Gynecology

## 2011-12-13 ENCOUNTER — Other Ambulatory Visit (HOSPITAL_COMMUNITY)
Admission: RE | Admit: 2011-12-13 | Discharge: 2011-12-13 | Disposition: A | Discharge status: Home / Self Care | Payer: PRIVATE HEALTH INSURANCE | Source: Ambulatory Visit | Attending: Obstetrics and Gynecology | Admitting: Obstetrics and Gynecology

## 2011-12-13 VITALS — BP 130/84 | Ht 69.75 in | Wt 236.0 lb

## 2011-12-13 DIAGNOSIS — Z01419 Encounter for gynecological examination (general) (routine) without abnormal findings: Secondary | ICD-10-CM

## 2011-12-13 DIAGNOSIS — N841 Polyp of cervix uteri: Secondary | ICD-10-CM

## 2011-12-13 DIAGNOSIS — N879 Dysplasia of cervix uteri, unspecified: Secondary | ICD-10-CM | POA: Insufficient documentation

## 2011-12-13 DIAGNOSIS — Z780 Asymptomatic menopausal state: Secondary | ICD-10-CM

## 2011-12-13 MED ORDER — ESTRADIOL 0.05 MG/24HR TD PTTW: 1.0000 | MEDICATED_PATCH | TRANSDERMAL | Status: DC

## 2011-12-13 NOTE — Addendum Note (Signed)
Addended by: Dayna Barker on: 12/13/2011 11:53 AM   Modules accepted: Orders

## 2011-12-13 NOTE — Patient Instructions (Signed)
Schedule  bone density. Continue yearly mammograms. 

## 2011-12-13 NOTE — Progress Notes (Signed)
Patient came to see me today for her annual GYN exam. We have had her on a Vivelle dot patch for menopausal symptoms. This summer her FSH was elevated at 30. She has done well with it but feels she is still symptomatic and would like [place] to increase the strength. She has a Mirena IUD which has been in for 2 years and therefore we do  not give her progesterone. She had a little bleeding in March but has been amenorrheic since then. She had her yearly mammogram this year. Her mother has a history of osteoporosis and she has not had a bone density. When she was in her 76s she was treated for cervical dysplasia with cryosurgery and has had normal Pap smears since then. Her last Pap smear was 2011. She uses testosterone cream with excellent results that we also give her.  Physical examination:Martha Vincent present. HEENT within normal limits. Neck: Thyroid not large. No masses. Supraclavicular nodes: not enlarged. Breasts: Examined in both sitting and lying  position. No skin changes and no masses. Abdomen: Soft no guarding rebound or masses or hernia. Pelvic: External: Within normal limits. BUS: Within normal limits. Vaginal:within normal limits. Good estrogen effect. No evidence of cystocele rectocele or enterocele. Cervix: IUD string visible. Polypoid growth coming out of cervix. Uterus: Normal size and shape. Adnexa: No masses. Rectovaginal exam: Confirmatory and negative. Extremities: Within normal limits.  Assessment: #1. Menopausal symptoms #2. Endocervical polyp  Plan: Continue yearly mammograms. Vivelle dot patch increased to 0.05 mg twice a week. Testosterone cream 2%-30 g tube with 8 refills given. Bone density ordered. Lab through PCP. The new Pap smear guidelines were discussed with the patient. Pap done.

## 2011-12-14 LAB — URINALYSIS W MICROSCOPIC + REFLEX CULTURE
Bacteria, UA: NONE SEEN
Bilirubin Urine: NEGATIVE
Casts: NONE SEEN
Crystals: NONE SEEN
Glucose, UA: NEGATIVE mg/dL
Ketones, ur: NEGATIVE mg/dL
Specific Gravity, Urine: 1.022 (ref 1.005–1.030)
pH: 5.5 (ref 5.0–8.0)

## 2011-12-16 DIAGNOSIS — R7301 Impaired fasting glucose: Secondary | ICD-10-CM | POA: Insufficient documentation

## 2011-12-16 DIAGNOSIS — E785 Hyperlipidemia, unspecified: Secondary | ICD-10-CM | POA: Insufficient documentation

## 2011-12-16 DIAGNOSIS — E669 Obesity, unspecified: Secondary | ICD-10-CM | POA: Insufficient documentation

## 2013-01-02 ENCOUNTER — Encounter (HOSPITAL_COMMUNITY): Payer: Self-pay | Admitting: Emergency Medicine

## 2013-01-02 ENCOUNTER — Emergency Department (HOSPITAL_COMMUNITY)
Admission: EM | Admit: 2013-01-02 | Discharge: 2013-01-02 | Disposition: A | Discharge status: Home / Self Care | Payer: PRIVATE HEALTH INSURANCE | Attending: Emergency Medicine | Admitting: Emergency Medicine

## 2013-01-02 ENCOUNTER — Ambulatory Visit (HOSPITAL_COMMUNITY): Payer: PRIVATE HEALTH INSURANCE | Attending: Emergency Medicine

## 2013-01-02 DIAGNOSIS — M7989 Other specified soft tissue disorders: Secondary | ICD-10-CM | POA: Insufficient documentation

## 2013-01-02 DIAGNOSIS — M25569 Pain in unspecified knee: Secondary | ICD-10-CM | POA: Insufficient documentation

## 2013-01-02 DIAGNOSIS — G8911 Acute pain due to trauma: Secondary | ICD-10-CM | POA: Insufficient documentation

## 2013-01-02 DIAGNOSIS — Z8742 Personal history of other diseases of the female genital tract: Secondary | ICD-10-CM | POA: Insufficient documentation

## 2013-01-02 DIAGNOSIS — Z79899 Other long term (current) drug therapy: Secondary | ICD-10-CM | POA: Insufficient documentation

## 2013-01-02 LAB — CBC WITH DIFFERENTIAL/PLATELET
Basophils Absolute: 0 10*3/uL (ref 0.0–0.1)
Basophils Relative: 0 % (ref 0–1)
Eosinophils Absolute: 0.2 10*3/uL (ref 0.0–0.7)
Hemoglobin: 13 g/dL (ref 12.0–15.0)
MCH: 31.6 pg (ref 26.0–34.0)
MCHC: 34.6 g/dL (ref 30.0–36.0)
Monocytes Relative: 7 % (ref 3–12)
Neutro Abs: 6.9 10*3/uL (ref 1.7–7.7)
Neutrophils Relative %: 60 % (ref 43–77)
RDW: 13.1 % (ref 11.5–15.5)

## 2013-01-02 LAB — BASIC METABOLIC PANEL
BUN: 18 mg/dL (ref 6–23)
Chloride: 98 mEq/L (ref 96–112)
Creatinine, Ser: 0.86 mg/dL (ref 0.50–1.10)
GFR calc Af Amer: >90 mL/min (ref >90)
GFR calc non Af Amer: 79 mL/min — ABNORMAL LOW (ref >90)
Potassium: 3.7 mEq/L (ref 3.5–5.1)

## 2013-01-02 LAB — PROTIME-INR: Prothrombin Time: 12.8 seconds (ref 11.6–15.2)

## 2013-01-02 MED ORDER — ENOXAPARIN SODIUM 100 MG/ML ~~LOC~~ SOLN: 100.0000 mg | Freq: Once | SUBCUTANEOUS | Status: DC

## 2013-01-02 MED ORDER — ENOXAPARIN SODIUM 100 MG/ML ~~LOC~~ SOLN
1.0000 mg/kg | Freq: Once | SUBCUTANEOUS | Status: AC
  Administered 2013-01-02: 100 mg via SUBCUTANEOUS
  Filled 2013-01-02: qty 1

## 2013-01-02 NOTE — ED Notes (Signed)
Pt had an mri on her right knee last week, this weekend she wore an ACE bandage and when she took it off she noticed her leg and foot more swollen warm and tight

## 2013-01-02 NOTE — ED Provider Notes (Signed)
CSN: [id number]     Arrival date & time 01/02/13  0006 History   First MD Initiated Contact with Patient 01/02/13 0022     Chief Complaint  Patient presents with  . Leg Pain   (Consider location/radiation/quality/duration/timing/severity/associated sxs/prior Treatment) HPI 47 year old female presents to emergency room with complaint of right lower leg pain and swelling starting today.  Pt had fall about 2 weeks ago, had MRI last week, showing medial meniscus tear.  She has been wearing a knee brace and today noted swelling and pain to foot, calf.  No fever, sob.  Some warmth to the leg.  Pt is local neurologist.  She was concerned about DVT.  No prior h/o same.  She has tried elevation without improvement in symptoms.  Past Medical History  Diagnosis Date  . Fibroid   . Cervical dysplasia    Past Surgical History  Procedure Laterality Date  . Myomectomy    . Cesarean section    . Gynecologic cryosurgery    . Colposcopy     Family History  Problem Relation Age of Onset  . Cleft lip Brother    History  Substance Use Topics  . Smoking status: Never Smoker   . Smokeless tobacco: Not on file  . Alcohol Use: 3.0 oz/week    6 drink(s) per week   OB History   Grav Para Term Preterm Abortions TAB SAB Ect Mult Living   4 1 1  3  3   1      Review of Systems  All other systems reviewed and are negative.    Allergies  Coconut flavor  Home Medications   Current Outpatient Rx  Name  Route  Sig  Dispense  Refill  . amphetamine-dextroamphetamine (ADDERALL XR) 30 MG 24 hr capsule   Oral   Take 30 mg by mouth daily.         Marland Kitchen aspirin EC 81 MG tablet   Oral   Take 324 mg by mouth once.          Marland Kitchen levonorgestrel (MIRENA) 20 MCG/24HR IUD   Intrauterine   1 each by Intrauterine route once.         . naproxen (NAPROSYN) 500 MG tablet   Oral   Take 500 mg by mouth 2 (two) times daily as needed.         . zaleplon (SONATA) 5 MG capsule   Oral   Take 5 mg by mouth  at bedtime as needed.         . enoxaparin (LOVENOX) 100 MG/ML injection   Subcutaneous   Inject 1 mL (100 mg total) into the skin once.   1 Syringe   5    BP 146/75  Pulse 85  Temp(Src) 98.4 F (36.9 C) (Oral)  Resp 20  Ht 5\' 10"  (1.778 m)  Wt 218 lb (98.884 kg)  BMI 31.28 kg/m2  SpO2 96% Physical Exam  Nursing note and vitals reviewed. Constitutional: She is oriented to person, place, and time. She appears well-developed and well-nourished.  HENT:  Head: Normocephalic and atraumatic.  Right Ear: External ear normal.  Left Ear: External ear normal.  Nose: Nose normal.  Mouth/Throat: Oropharynx is clear and moist.  Eyes: Conjunctivae and EOM are normal. Pupils are equal, round, and reactive to light.  Neck: Normal range of motion. Neck supple. No JVD present. No tracheal deviation present. No thyromegaly present.  Cardiovascular: Normal rate, regular rhythm, normal heart sounds and intact distal pulses.  Exam reveals  no gallop and no friction rub.   No murmur heard. Pulmonary/Chest: Effort normal and breath sounds normal. No stridor. No respiratory distress. She has no wheezes. She has no rales. She exhibits no tenderness.  Abdominal: Soft. Bowel sounds are normal. She exhibits no distension and no mass. There is no tenderness. There is no rebound and no guarding.  Musculoskeletal: Normal range of motion. She exhibits edema and tenderness.  TTP over right medial posterior calf with some nodularity.  No specific cord noted.  Swelling and redness to lower leg  Lymphadenopathy:    She has no cervical adenopathy.  Neurological: She is alert and oriented to person, place, and time. She exhibits normal muscle tone. Coordination normal.  Skin: Skin is warm and dry. No rash noted. No erythema. No pallor.  Psychiatric: She has a normal mood and affect. Her behavior is normal. Judgment and thought content normal.    ED Course  Procedures (including critical care time) Labs  Review Labs Reviewed  CBC WITH DIFFERENTIAL - Abnormal; Notable for the following:    WBC 11.4 (*)    All other components within normal limits  BASIC METABOLIC PANEL - Abnormal; Notable for the following:    Sodium 134 (*)    Glucose, Bld 103 (*)    GFR calc non Af Amer 79 (*)    All other components within normal limits  PROTIME-INR   Imaging Review No results found.  EKG Interpretation   None       MDM   1. Lower leg pain, right   2. Right leg swelling    47 yo female with possible DVT.  Labs unremarkable.  She has received lovenox.  She reprots she is unable to have duplex scan done Wednesday am due to a full clinic day, but can have it done on Thursday.  She has been given rx for second lovenox shot tomorrow.  Her husband is also a physician and can administer the shot.     Olivia Mackie, MD 01/02/13 [phone]

## 2013-01-03 ENCOUNTER — Ambulatory Visit (HOSPITAL_COMMUNITY)
Admission: RE | Admit: 2013-01-03 | Discharge: 2013-01-03 | Disposition: A | Discharge status: Home / Self Care | Payer: PRIVATE HEALTH INSURANCE | Source: Ambulatory Visit | Attending: Emergency Medicine | Admitting: Emergency Medicine

## 2013-01-03 DIAGNOSIS — M79609 Pain in unspecified limb: Secondary | ICD-10-CM

## 2013-01-03 DIAGNOSIS — R609 Edema, unspecified: Secondary | ICD-10-CM | POA: Insufficient documentation

## 2013-01-03 NOTE — Progress Notes (Signed)
VASCULAR LAB PRELIMINARY  PRELIMINARY  PRELIMINARY  PRELIMINARY  Right lower extremity venous duplex completed.    Preliminary report:  Right:  No evidence of DVT, superficial thrombosis, or Baker's cyst.  Nakkia Mackiewicz, RVS 01/03/2013, 12:03 PM

## 2013-02-28 ENCOUNTER — Ambulatory Visit (INDEPENDENT_AMBULATORY_CARE_PROVIDER_SITE_OTHER): Payer: PRIVATE HEALTH INSURANCE | Admitting: Gynecology

## 2013-02-28 ENCOUNTER — Encounter: Payer: Self-pay | Admitting: Gynecology

## 2013-02-28 VITALS — BP 140/90 | Ht 70.0 in | Wt 232.0 lb

## 2013-02-28 DIAGNOSIS — Z01419 Encounter for gynecological examination (general) (routine) without abnormal findings: Secondary | ICD-10-CM

## 2013-02-28 DIAGNOSIS — N949 Unspecified condition associated with female genital organs and menstrual cycle: Secondary | ICD-10-CM

## 2013-02-28 DIAGNOSIS — T8389XA Other specified complication of genitourinary prosthetic devices, implants and grafts, initial encounter: Secondary | ICD-10-CM

## 2013-02-28 DIAGNOSIS — N938 Other specified abnormal uterine and vaginal bleeding: Secondary | ICD-10-CM

## 2013-02-28 LAB — FOLLICLE STIMULATING HORMONE: FSH: 73.4 m[IU]/mL

## 2013-02-28 NOTE — Patient Instructions (Signed)
Follow up for ultrasound as scheduled 

## 2013-02-28 NOTE — Progress Notes (Signed)
Martha Vincent 02-09-1966 [id number]        47 y.o.  [id number] for annual exam.  Former patient of Dr. Eda Paschal. Several issues noted below.  Past medical history,surgical history, problem list, medications, allergies, family history and social history were all reviewed and documented in the EPIC chart.  ROS:  Performed and pertinent positives and negatives are included in the history, assessment and plan .  Exam: Kim assistant Filed Vitals:   02/28/13 1021  BP: 140/90  Height: 5\' 10"  (1.778 m)  Weight: 232 lb (105.235 kg)   General appearance  Normal Skin grossly normal Head/Neck normal with no cervical or supraclavicular adenopathy thyroid normal Lungs  clear Cardiac RR, without RMG Abdominal  soft, nontender, without masses, organomegaly or hernia Breasts  examined lying and sitting without masses, retractions, discharge or axillary adenopathy. Pelvic  Ext/BUS/vagina  Normal   Cervix  Normal IUD string not visualized  Uterus  axial to anteverted, normal size, shape and contour, midline and mobile nontender   Adnexa  Without masses or tenderness    Anus and perineum  Normal   Rectovaginal  Normal sphincter tone without palpated masses or tenderness.    Assessment/Plan:  47 y.o. [name] female for annual exam, amenorrheic, Mirena IUD 11/2009.   1. Irregular bleeding. Patient notes over the past month spotting on and off. Also over the past year feeling bloated in her abdomen/pelvis. Having regular bowel movements. Without urinary symptoms. Will start with ultrasound for pelvic surveillance and IUD location. Had marginal FSH in July at 30. Recheck FSH now. Discussed limitations of endometrial evaluation with IUD in place. Various scenarios and possibilities to include removing the IUD and keeping a menstrual calendar or removing the IUD and starting on low-dose oral contraceptives discussed. Will readdress after [facility] and ultrasound results. 2. Mirena IUD 11/2009. As per  above. 3. Abdominal bloating. Without significant other symptoms. As per above with ultrasound. If continues recommend GI evaluation. 4. Pap smear 2013. No Pap smear done today. History of cryosurgery in her early 43s. Normal Pap smears since then. Discussed current screening guidelines and will plan less frequent screening interval. 5. Mammography overdue and patient reminded to schedule. SBE monthly reviewed. 6. Health maintenance. No routine blood work done. Patient sees Dr. Waynard Edwards routinely. Blood pressure is mildly elevated at 140/90 and this was discussed with her. She will followup and monitor have remains elevated see Dr. Waynard Edwards. Followup for ultrasound and blood work.   Note: This document was prepared with digital dictation and possible smart phrase technology. Any transcriptional errors that result from this process are unintentional.   Dara Lords MD, 10:55 AM 02/28/2013

## 2013-03-01 LAB — URINALYSIS W MICROSCOPIC + REFLEX CULTURE
Casts: NONE SEEN
Glucose, UA: NEGATIVE mg/dL
Ketones, ur: NEGATIVE mg/dL
Protein, ur: NEGATIVE mg/dL
pH: 5.5 (ref 5.0–8.0)

## 2013-03-02 LAB — URINE CULTURE
Colony Count: NO GROWTH
Organism ID, Bacteria: NO GROWTH

## 2013-03-04 ENCOUNTER — Ambulatory Visit (INDEPENDENT_AMBULATORY_CARE_PROVIDER_SITE_OTHER): Payer: PRIVATE HEALTH INSURANCE

## 2013-03-04 ENCOUNTER — Ambulatory Visit (INDEPENDENT_AMBULATORY_CARE_PROVIDER_SITE_OTHER): Payer: PRIVATE HEALTH INSURANCE | Admitting: Gynecology

## 2013-03-04 ENCOUNTER — Encounter: Payer: Self-pay | Admitting: Gynecology

## 2013-03-04 ENCOUNTER — Telehealth: Payer: Self-pay | Admitting: *Deleted

## 2013-03-04 ENCOUNTER — Other Ambulatory Visit: Payer: Self-pay | Admitting: Gynecology

## 2013-03-04 DIAGNOSIS — N921 Excessive and frequent menstruation with irregular cycle: Secondary | ICD-10-CM

## 2013-03-04 DIAGNOSIS — N949 Unspecified condition associated with female genital organs and menstrual cycle: Secondary | ICD-10-CM

## 2013-03-04 DIAGNOSIS — N8300 Follicular cyst of ovary, unspecified side: Secondary | ICD-10-CM

## 2013-03-04 DIAGNOSIS — N938 Other specified abnormal uterine and vaginal bleeding: Secondary | ICD-10-CM

## 2013-03-04 DIAGNOSIS — N926 Irregular menstruation, unspecified: Secondary | ICD-10-CM

## 2013-03-04 DIAGNOSIS — T8332XA Displacement of intrauterine contraceptive device, initial encounter: Secondary | ICD-10-CM

## 2013-03-04 DIAGNOSIS — Z975 Presence of (intrauterine) contraceptive device: Secondary | ICD-10-CM

## 2013-03-04 MED ORDER — NONFORMULARY OR COMPOUNDED ITEM: Status: DC

## 2013-03-04 NOTE — Patient Instructions (Signed)
Reinitiate testosterone cream as we discussed. Call me if you have any issues with this.

## 2013-03-04 NOTE — Telephone Encounter (Signed)
rx called in

## 2013-03-04 NOTE — Telephone Encounter (Signed)
Message copied by Aura Camps on Mon Mar 04, 2013  4:24 PM ------      Message from: Dara Lords      Created: Mon Mar 04, 2013 12:30 PM       Call into [place] [facility] testosterone 2% cream 30 g/60 g at patient or pharmacy preference sig: fingertip application daily refill x6 ------

## 2013-03-04 NOTE — Progress Notes (Signed)
The patient presents for ultrasound. Her FSH returned 75 and her urinalysis did show microscopic hematuria with 3-6 RBCs per high-powered field. Patient notes that she's had microscopic hematuria in the past has been evaluated with a negative workup. She is not having significant menopausal symptoms such as hot flushes or night sweats. She is having decreased libido for which she used 2% testosterone cream in the past and it seemed to help and she would like to reinitiate this. She is applying it daily a fingertip application to the inner aspects of her thigh. I did review the issues with absorption to include adverse lipid profile, acne and hair growth. At the extreme liver disease.  Ultrasound shows uterus normal size and echotexture. Endometrial echo 2.8 mm. Right and left ovaries visualized and postmenopausal in appearance. Cul-de-sac negative. IUD noted in the appropriate location.  Assessment and plan: Breakthrough bleeding with Mirena IUD in perimenopausal patient. Not having significant menopausal symptoms to warrant addition of HRT at this time. Thin endometrial echo reassuring at this point we'll plan on observation she is in agreement with this. Will reinitiate testosterone cream 2% for libido she did go to sleep to help in the past.

## 2013-03-05 ENCOUNTER — Encounter: Payer: Self-pay | Admitting: Gynecology

## 2013-03-21 HISTORY — PX: OTHER SURGICAL HISTORY: SHX169

## 2013-09-23 DIAGNOSIS — J45909 Unspecified asthma, uncomplicated: Secondary | ICD-10-CM | POA: Insufficient documentation

## 2014-01-16 ENCOUNTER — Telehealth: Payer: Self-pay | Admitting: *Deleted

## 2014-01-16 MED ORDER — ESTRADIOL 0.025 MG/24HR TD PTTW: 1.0000 | MEDICATED_PATCH | TRANSDERMAL | Status: DC

## 2014-01-16 NOTE — Telephone Encounter (Signed)
Pt called requesting new Rx for vivelle dot patch 0.025mg  as it has expired. Menopausal symptoms (hot flashes)  have returned.  Okay to send Rx?

## 2014-01-16 NOTE — Telephone Encounter (Signed)
Rx sent, left on pt voicemail this has been done 

## 2014-01-16 NOTE — Telephone Encounter (Signed)
Okay for Vivelle 0.025 mg patch #8 refill 3. She is due for her annual in December

## 2014-01-20 ENCOUNTER — Encounter: Payer: Self-pay | Admitting: Gynecology

## 2014-02-18 ENCOUNTER — Other Ambulatory Visit: Payer: Self-pay

## 2014-02-18 DIAGNOSIS — Z1231 Encounter for screening mammogram for malignant neoplasm of breast: Secondary | ICD-10-CM

## 2014-03-11 ENCOUNTER — Ambulatory Visit
Admission: RE | Admit: 2014-03-11 | Discharge: 2014-03-11 | Disposition: A | Discharge status: Home / Self Care | Payer: PRIVATE HEALTH INSURANCE | Source: Ambulatory Visit

## 2014-03-11 DIAGNOSIS — Z1231 Encounter for screening mammogram for malignant neoplasm of breast: Secondary | ICD-10-CM

## 2014-12-03 ENCOUNTER — Encounter (HOSPITAL_COMMUNITY): Payer: Self-pay | Admitting: Emergency Medicine

## 2014-12-03 ENCOUNTER — Emergency Department (HOSPITAL_COMMUNITY): Payer: PRIVATE HEALTH INSURANCE

## 2014-12-03 ENCOUNTER — Observation Stay (HOSPITAL_COMMUNITY)
Admission: EM | Admit: 2014-12-03 | Discharge: 2014-12-05 | Disposition: A | Discharge status: Home / Self Care | Payer: PRIVATE HEALTH INSURANCE | Attending: General Surgery | Admitting: General Surgery

## 2014-12-03 DIAGNOSIS — K810 Acute cholecystitis: Secondary | ICD-10-CM | POA: Diagnosis present

## 2014-12-03 DIAGNOSIS — K8000 Calculus of gallbladder with acute cholecystitis without obstruction: Principal | ICD-10-CM | POA: Insufficient documentation

## 2014-12-03 DIAGNOSIS — R1011 Right upper quadrant pain: Secondary | ICD-10-CM

## 2014-12-03 LAB — COMPREHENSIVE METABOLIC PANEL
ALBUMIN: 4 g/dL (ref 3.5–5.0)
ALK PHOS: 71 U/L (ref 38–126)
ALT: 30 U/L (ref 14–54)
ANION GAP: 9 (ref 5–15)
AST: 35 U/L (ref 15–41)
BUN: 15 mg/dL (ref 6–20)
CALCIUM: 9.7 mg/dL (ref 8.9–10.3)
CO2: 25 mmol/L (ref 22–32)
CREATININE: 0.99 mg/dL (ref 0.44–1.00)
Chloride: 104 mmol/L (ref 101–111)
GFR calc Af Amer: >60 mL/min (ref >60)
GFR calc non Af Amer: >60 mL/min (ref >60)
GLUCOSE: 119 mg/dL — AB (ref 65–99)
Potassium: 3.9 mmol/L (ref 3.5–5.1)
SODIUM: 138 mmol/L (ref 135–145)
Total Bilirubin: 0.5 mg/dL (ref 0.3–1.2)
Total Protein: 7.3 g/dL (ref 6.5–8.1)

## 2014-12-03 LAB — CBC
HCT: 41.6 % (ref 36.0–46.0)
HEMOGLOBIN: 13.7 g/dL (ref 12.0–15.0)
MCH: 30.1 pg (ref 26.0–34.0)
MCHC: 32.9 g/dL (ref 30.0–36.0)
MCV: 91.4 fL (ref 78.0–100.0)
Platelets: 278 10*3/uL (ref 150–400)
RBC: 4.55 MIL/uL (ref 3.87–5.11)
RDW: 13.1 % (ref 11.5–15.5)
WBC: 8.6 10*3/uL (ref 4.0–10.5)

## 2014-12-03 LAB — LIPASE, BLOOD: Lipase: 22 U/L (ref 22–51)

## 2014-12-03 MED ORDER — IOHEXOL 300 MG/ML  SOLN
25.0000 mL | Freq: Once | INTRAMUSCULAR | Status: AC | PRN
  Administered 2014-12-03: 25 mL via ORAL

## 2014-12-03 MED ORDER — MORPHINE SULFATE (PF) 4 MG/ML IV SOLN
4.0000 mg | Freq: Once | INTRAVENOUS | Status: AC
  Administered 2014-12-03: 4 mg via INTRAVENOUS
  Filled 2014-12-03: qty 1

## 2014-12-03 MED ORDER — ONDANSETRON HCL 4 MG/2ML IJ SOLN
4.0000 mg | Freq: Once | INTRAMUSCULAR | Status: AC
  Administered 2014-12-03: 4 mg via INTRAVENOUS

## 2014-12-03 MED ORDER — ONDANSETRON HCL 4 MG/2ML IJ SOLN
4.0000 mg | Freq: Once | INTRAMUSCULAR | Status: AC | PRN
  Administered 2014-12-04: 4 mg via INTRAVENOUS
  Filled 2014-12-03 (×2): qty 2

## 2014-12-03 MED ORDER — HYDROMORPHONE HCL 1 MG/ML IJ SOLN
1.0000 mg | Freq: Once | INTRAMUSCULAR | Status: AC
  Administered 2014-12-03: 1 mg via INTRAVENOUS
  Filled 2014-12-03: qty 1

## 2014-12-03 MED ORDER — IOHEXOL 300 MG/ML  SOLN
100.0000 mL | Freq: Once | INTRAMUSCULAR | Status: AC | PRN
  Administered 2014-12-03: 100 mL via INTRAVENOUS

## 2014-12-03 MED ORDER — SODIUM CHLORIDE 0.9 % IV BOLUS (SEPSIS)
1000.0000 mL | Freq: Once | INTRAVENOUS | Status: AC
  Administered 2014-12-03: 1000 mL via INTRAVENOUS

## 2014-12-03 NOTE — ED Provider Notes (Signed)
CSN: [id number]     Arrival date & time 12/03/14  2122 History   First MD Initiated Contact with Patient 12/03/14 2134     Chief Complaint  Patient presents with  . Abdominal Pain     (Consider location/radiation/quality/duration/timing/severity/associated sxs/prior Treatment) HPI..... Right upper quadrant > right lower quadrant abdominal pain for 3 days intermittently with associated belching and bloating.  Ultrasound of her gallbladder this spring revealed "polyps", but no cholelithiasis. Chills today. No fever/sweats. Decreased appetite. Status post cesarean section and myomectomy. She still has her gallbladder.  Decreased appetite.  History reviewed. No pertinent past medical history. Past Surgical History  Procedure Laterality Date  . Myomectomy    . Cesarean section     No family history on file. Social History  Substance Use Topics  . Smoking status: Never Smoker   . Smokeless tobacco: None  . Alcohol Use: Yes   OB History    No data available     Review of Systems  All other systems reviewed and are negative.     Allergies  Bactrim and Sulfa antibiotics  Home Medications   Prior to Admission medications   Medication Sig Start Date End Date Taking? Authorizing Provider  acetaminophen (TYLENOL) 325 MG tablet Take 650 mg by mouth every 6 (six) hours as needed for mild pain or headache.   Yes Historical Provider, MD  ibuprofen (ADVIL,MOTRIN) 200 MG tablet Take 200 mg by mouth every 6 (six) hours as needed for headache or mild pain.   Yes Historical Provider, MD  Melatonin 10 MG TABS Take 10 mg by mouth as needed (for sleep).   Yes Historical Provider, MD   BP 149/77 mmHg  Pulse 64  Temp(Src) 97.2 F (36.2 C) (Oral)  Resp 18  SpO2 99% Physical Exam  Constitutional: She is oriented to person, place, and time. She appears well-developed and well-nourished.  HENT:  Head: Normocephalic and atraumatic.  Eyes: Conjunctivae and EOM are normal. Pupils are equal,  round, and reactive to light.  Neck: Normal range of motion. Neck supple.  Cardiovascular: Normal rate and regular rhythm.   Pulmonary/Chest: Effort normal and breath sounds normal.  Abdominal: Soft. Bowel sounds are normal.  Tender right upper quadrant greater than right lower quadrant.  Musculoskeletal: Normal range of motion.  Neurological: She is alert and oriented to person, place, and time.  Skin: Skin is warm and dry.  Psychiatric: She has a normal mood and affect. Her behavior is normal.  Nursing note and vitals reviewed.   ED Course  Procedures (including critical care time) Labs Review Labs Reviewed  COMPREHENSIVE METABOLIC PANEL - Abnormal; Notable for the following:    Glucose, Bld 119 (*)    All other components within normal limits  LIPASE, BLOOD  CBC  URINALYSIS, ROUTINE W REFLEX MICROSCOPIC (NOT AT [facility])  PREGNANCY, URINE    Imaging Review No results found. I have personally reviewed and evaluated these images and lab results as part of my medical decision-making.   EKG Interpretation None      MDM   Final diagnoses:  RUQ abdominal pain    CT abdomen/pelvis pending. White count, liver functions, lipase all reassuring. Discussed with Dr. Nance Pear, MD 12/03/14 2352

## 2014-12-03 NOTE — ED Notes (Addendum)
Pt. reports RUQ pain with nausea and vomitting onset 3 days ago  , belching/bloated , denies fever , chills today .

## 2014-12-03 NOTE — ED Provider Notes (Signed)
49 y/o healthy female with RUQ abd pain x 3 days. On exam there is mild RLQ tenderness. US abdomen was neg cholelithiasis/cystitis. CT scan is pending. Labs are reassuring currently.  If workup is neg, will reassess and decide on a specific plan.  Filed Vitals:   12/03/14 2300  BP: 149/77  Pulse: 64  Temp:   Resp:      Varney Biles, MD 12/03/14 2542

## 2014-12-03 NOTE — ED Notes (Signed)
Patient transported to CT scan . 

## 2014-12-04 ENCOUNTER — Encounter (HOSPITAL_COMMUNITY): Payer: Self-pay | Admitting: Anesthesiology

## 2014-12-04 ENCOUNTER — Observation Stay (HOSPITAL_COMMUNITY): Payer: PRIVATE HEALTH INSURANCE | Admitting: Anesthesiology

## 2014-12-04 ENCOUNTER — Encounter (HOSPITAL_COMMUNITY): Admission: EM | Disposition: A | Discharge status: Home / Self Care | Payer: Self-pay | Source: Home / Self Care

## 2014-12-04 DIAGNOSIS — K810 Acute cholecystitis: Secondary | ICD-10-CM

## 2014-12-04 HISTORY — PX: CHOLECYSTECTOMY: SHX55

## 2014-12-04 HISTORY — DX: Acute cholecystitis: K81.0

## 2014-12-04 LAB — URINALYSIS, ROUTINE W REFLEX MICROSCOPIC
Bilirubin Urine: NEGATIVE
GLUCOSE, UA: NEGATIVE mg/dL
HGB URINE DIPSTICK: NEGATIVE
Ketones, ur: NEGATIVE mg/dL
Nitrite: NEGATIVE
PROTEIN: NEGATIVE mg/dL
Specific Gravity, Urine: 1.04 — ABNORMAL HIGH (ref 1.005–1.030)
Urobilinogen, UA: 0.2 mg/dL (ref 0.0–1.0)
pH: 5.5 (ref 5.0–8.0)

## 2014-12-04 LAB — URINE MICROSCOPIC-ADD ON

## 2014-12-04 LAB — COMPREHENSIVE METABOLIC PANEL
ALBUMIN: 3.3 g/dL — AB (ref 3.5–5.0)
ALK PHOS: 57 U/L (ref 38–126)
ALT: 57 U/L — AB (ref 14–54)
AST: 63 U/L — AB (ref 15–41)
Anion gap: 5 (ref 5–15)
BILIRUBIN TOTAL: 0.4 mg/dL (ref 0.3–1.2)
BUN: 10 mg/dL (ref 6–20)
CALCIUM: 8.4 mg/dL — AB (ref 8.9–10.3)
CO2: 24 mmol/L (ref 22–32)
CREATININE: 0.78 mg/dL (ref 0.44–1.00)
Chloride: 109 mmol/L (ref 101–111)
GFR calc Af Amer: >60 mL/min (ref >60)
GFR calc non Af Amer: >60 mL/min (ref >60)
GLUCOSE: 107 mg/dL — AB (ref 65–99)
Potassium: 4.1 mmol/L (ref 3.5–5.1)
SODIUM: 138 mmol/L (ref 135–145)
Total Protein: 5.6 g/dL — ABNORMAL LOW (ref 6.5–8.1)

## 2014-12-04 LAB — PREGNANCY, URINE: Preg Test, Ur: NEGATIVE

## 2014-12-04 LAB — SURGICAL PCR SCREEN
MRSA, PCR: NEGATIVE
Staphylococcus aureus: NEGATIVE

## 2014-12-04 SURGERY — LAPAROSCOPIC CHOLECYSTECTOMY
Anesthesia: General

## 2014-12-04 MED ORDER — HYDROMORPHONE HCL 1 MG/ML IJ SOLN
INTRAMUSCULAR | Status: AC
  Administered 2014-12-04: 0.25 mg via INTRAVENOUS
  Filled 2014-12-04: qty 1

## 2014-12-04 MED ORDER — HYDROMORPHONE HCL 1 MG/ML IJ SOLN
1.0000 mg | Freq: Once | INTRAMUSCULAR | Status: AC
  Administered 2014-12-04: 1 mg via INTRAVENOUS

## 2014-12-04 MED ORDER — FENTANYL CITRATE (PF) 250 MCG/5ML IJ SOLN
INTRAMUSCULAR | Status: AC
  Filled 2014-12-04: qty 5

## 2014-12-04 MED ORDER — PROMETHAZINE HCL 25 MG/ML IJ SOLN: 6.2500 mg | INTRAMUSCULAR | Status: DC | PRN

## 2014-12-04 MED ORDER — HYDROMORPHONE HCL 1 MG/ML IJ SOLN
0.5000 mg | INTRAMUSCULAR | Status: DC | PRN
  Administered 2014-12-04 – 2014-12-05 (×3): 1 mg via INTRAVENOUS
  Filled 2014-12-04 (×3): qty 1

## 2014-12-04 MED ORDER — ACETAMINOPHEN 500 MG PO TABS: 1000.0000 mg | ORAL_TABLET | Freq: Four times a day (QID) | ORAL | Status: DC | PRN

## 2014-12-04 MED ORDER — BUPIVACAINE HCL 0.25 % IJ SOLN
INTRAMUSCULAR | Status: DC | PRN
  Administered 2014-12-04: 6 mL

## 2014-12-04 MED ORDER — DIPHENHYDRAMINE HCL 50 MG/ML IJ SOLN: 12.5000 mg | Freq: Four times a day (QID) | INTRAMUSCULAR | Status: DC | PRN

## 2014-12-04 MED ORDER — NEOSTIGMINE METHYLSULFATE 10 MG/10ML IV SOLN
INTRAVENOUS | Status: AC
  Filled 2014-12-04: qty 1

## 2014-12-04 MED ORDER — MIDAZOLAM HCL 2 MG/2ML IJ SOLN
INTRAMUSCULAR | Status: AC
  Filled 2014-12-04: qty 4

## 2014-12-04 MED ORDER — CEFTRIAXONE SODIUM 2 G IJ SOLR
2.0000 g | INTRAMUSCULAR | Status: DC
  Administered 2014-12-04: 2 g via INTRAVENOUS
  Filled 2014-12-04: qty 2

## 2014-12-04 MED ORDER — HYDROMORPHONE HCL 1 MG/ML IJ SOLN
1.0000 mg | INTRAMUSCULAR | Status: DC | PRN
  Filled 2014-12-04: qty 1

## 2014-12-04 MED ORDER — SODIUM CHLORIDE 0.9 % IR SOLN
Status: DC | PRN
  Administered 2014-12-04: 1

## 2014-12-04 MED ORDER — GLYCOPYRROLATE 0.2 MG/ML IJ SOLN
INTRAMUSCULAR | Status: DC | PRN
  Administered 2014-12-04: 0.4 mg via INTRAVENOUS

## 2014-12-04 MED ORDER — ROCURONIUM BROMIDE 100 MG/10ML IV SOLN
INTRAVENOUS | Status: DC | PRN
  Administered 2014-12-04: 25 mg via INTRAVENOUS

## 2014-12-04 MED ORDER — SUCCINYLCHOLINE CHLORIDE 20 MG/ML IJ SOLN
INTRAMUSCULAR | Status: AC
  Filled 2014-12-04: qty 1

## 2014-12-04 MED ORDER — BUPIVACAINE HCL (PF) 0.25 % IJ SOLN
INTRAMUSCULAR | Status: AC
  Filled 2014-12-04: qty 30

## 2014-12-04 MED ORDER — DEXTROSE-NACL 5-0.45 % IV SOLN: INTRAVENOUS | Status: DC

## 2014-12-04 MED ORDER — NEOSTIGMINE METHYLSULFATE 10 MG/10ML IV SOLN
INTRAVENOUS | Status: DC | PRN
  Administered 2014-12-04: 3 mg via INTRAVENOUS

## 2014-12-04 MED ORDER — ONDANSETRON 4 MG PO TBDP: 4.0000 mg | ORAL_TABLET | Freq: Four times a day (QID) | ORAL | Status: DC | PRN

## 2014-12-04 MED ORDER — PROMETHAZINE HCL 25 MG/ML IJ SOLN
6.2500 mg | INTRAMUSCULAR | Status: DC | PRN
Start: 2014-12-04 — End: 2014-12-04

## 2014-12-04 MED ORDER — LABETALOL HCL 5 MG/ML IV SOLN
INTRAVENOUS | Status: DC | PRN
  Administered 2014-12-04 (×2): 5 mg via INTRAVENOUS
  Administered 2014-12-04: 10 mg via INTRAVENOUS

## 2014-12-04 MED ORDER — HYDROMORPHONE HCL 1 MG/ML IJ SOLN: 0.2500 mg | INTRAMUSCULAR | Status: DC | PRN

## 2014-12-04 MED ORDER — ONDANSETRON HCL 4 MG/2ML IJ SOLN
INTRAMUSCULAR | Status: AC
  Filled 2014-12-04: qty 2

## 2014-12-04 MED ORDER — ONDANSETRON HCL 4 MG/2ML IJ SOLN
4.0000 mg | Freq: Four times a day (QID) | INTRAMUSCULAR | Status: DC | PRN
  Administered 2014-12-05: 4 mg via INTRAVENOUS

## 2014-12-04 MED ORDER — HYDROMORPHONE HCL 1 MG/ML IJ SOLN
0.2500 mg | INTRAMUSCULAR | Status: DC | PRN
  Administered 2014-12-04: 0.5 mg via INTRAVENOUS
  Administered 2014-12-04: 0.25 mg via INTRAVENOUS

## 2014-12-04 MED ORDER — DEXTROSE 5 % IV SOLN
1.0000 g | Freq: Once | INTRAVENOUS | Status: AC
  Administered 2014-12-04: 1 g via INTRAVENOUS
  Filled 2014-12-04: qty 10

## 2014-12-04 MED ORDER — LIDOCAINE HCL (CARDIAC) 20 MG/ML IV SOLN
INTRAVENOUS | Status: DC | PRN
  Administered 2014-12-04: 70 mg via INTRAVENOUS

## 2014-12-04 MED ORDER — PROMETHAZINE HCL 25 MG/ML IJ SOLN
12.5000 mg | Freq: Four times a day (QID) | INTRAMUSCULAR | Status: DC | PRN
  Administered 2014-12-04: 25 mg via INTRAVENOUS
  Filled 2014-12-04: qty 1

## 2014-12-04 MED ORDER — LABETALOL HCL 5 MG/ML IV SOLN
INTRAVENOUS | Status: AC
  Filled 2014-12-04: qty 4

## 2014-12-04 MED ORDER — GLYCOPYRROLATE 0.2 MG/ML IJ SOLN
INTRAMUSCULAR | Status: AC
  Filled 2014-12-04: qty 2

## 2014-12-04 MED ORDER — ZOLPIDEM TARTRATE 5 MG PO TABS: 5.0000 mg | ORAL_TABLET | Freq: Once | ORAL | Status: DC

## 2014-12-04 MED ORDER — ACETAMINOPHEN 325 MG PO TABS: 650.0000 mg | ORAL_TABLET | Freq: Four times a day (QID) | ORAL | Status: DC | PRN

## 2014-12-04 MED ORDER — SIMETHICONE 80 MG PO CHEW: 40.0000 mg | CHEWABLE_TABLET | Freq: Four times a day (QID) | ORAL | Status: DC | PRN

## 2014-12-04 MED ORDER — FENTANYL CITRATE (PF) 100 MCG/2ML IJ SOLN
INTRAMUSCULAR | Status: DC | PRN
  Administered 2014-12-04: 150 ug via INTRAVENOUS
  Administered 2014-12-04: 100 ug via INTRAVENOUS

## 2014-12-04 MED ORDER — DIPHENHYDRAMINE HCL 12.5 MG/5ML PO ELIX: 12.5000 mg | ORAL_SOLUTION | Freq: Four times a day (QID) | ORAL | Status: DC | PRN

## 2014-12-04 MED ORDER — PROPOFOL 10 MG/ML IV BOLUS
INTRAVENOUS | Status: AC
  Filled 2014-12-04: qty 20

## 2014-12-04 MED ORDER — ONDANSETRON HCL 4 MG/2ML IJ SOLN
4.0000 mg | Freq: Four times a day (QID) | INTRAMUSCULAR | Status: DC | PRN
  Administered 2014-12-04: 4 mg via INTRAVENOUS
  Filled 2014-12-04: qty 2

## 2014-12-04 MED ORDER — ROCURONIUM BROMIDE 50 MG/5ML IV SOLN
INTRAVENOUS | Status: AC
  Filled 2014-12-04: qty 1

## 2014-12-04 MED ORDER — HYDROMORPHONE HCL 1 MG/ML IJ SOLN
INTRAMUSCULAR | Status: AC
  Filled 2014-12-04: qty 1

## 2014-12-04 MED ORDER — SUCCINYLCHOLINE CHLORIDE 20 MG/ML IJ SOLN
INTRAMUSCULAR | Status: DC | PRN
  Administered 2014-12-04: 120 mg via INTRAVENOUS

## 2014-12-04 MED ORDER — 0.9 % SODIUM CHLORIDE (POUR BTL) OPTIME
TOPICAL | Status: DC | PRN
  Administered 2014-12-04: 1000 mL

## 2014-12-04 MED ORDER — KCL IN DEXTROSE-NACL 20-5-0.45 MEQ/L-%-% IV SOLN
INTRAVENOUS | Status: DC
  Administered 2014-12-04 – 2014-12-05 (×3): via INTRAVENOUS
  Filled 2014-12-04 (×3): qty 1000

## 2014-12-04 MED ORDER — OXYCODONE HCL 5 MG PO TABS
5.0000 mg | ORAL_TABLET | ORAL | Status: DC | PRN
  Administered 2014-12-04 – 2014-12-05 (×3): 10 mg via ORAL
  Filled 2014-12-04 (×3): qty 2

## 2014-12-04 MED ORDER — LIDOCAINE HCL (CARDIAC) 20 MG/ML IV SOLN
INTRAVENOUS | Status: AC
  Filled 2014-12-04: qty 5

## 2014-12-04 MED ORDER — SODIUM CHLORIDE 0.9 % IV SOLN
Freq: Once | INTRAVENOUS | Status: AC
  Administered 2014-12-04: 01:00:00 via INTRAVENOUS

## 2014-12-04 MED ORDER — HYDROMORPHONE HCL 1 MG/ML IJ SOLN
0.5000 mg | INTRAMUSCULAR | Status: DC | PRN
  Administered 2014-12-04: 1 mg via INTRAVENOUS

## 2014-12-04 MED ORDER — PROPOFOL 10 MG/ML IV BOLUS
INTRAVENOUS | Status: DC | PRN
  Administered 2014-12-04: 200 mg via INTRAVENOUS

## 2014-12-04 MED ORDER — LACTATED RINGERS IV BOLUS (SEPSIS)
1000.0000 mL | Freq: Once | INTRAVENOUS | Status: AC
  Administered 2014-12-04: 1000 mL via INTRAVENOUS

## 2014-12-04 MED ORDER — ZOLPIDEM TARTRATE 5 MG PO TABS: 10.0000 mg | ORAL_TABLET | Freq: Once | ORAL | Status: DC

## 2014-12-04 MED ORDER — LACTATED RINGERS IV SOLN
INTRAVENOUS | Status: DC
  Administered 2014-12-04 (×3): via INTRAVENOUS

## 2014-12-04 SURGICAL SUPPLY — 46 items
APL SKNCLS STERI-STRIP NONHPOA (GAUZE/BANDAGES/DRESSINGS) ×1
BAG SPEC RTRVL 10 TROC 200 (ENDOMECHANICALS) ×1
BENZOIN TINCTURE PRP APPL 2/3 (GAUZE/BANDAGES/DRESSINGS) ×2 IMPLANT
CANISTER SUCTION 2500CC (MISCELLANEOUS) ×2 IMPLANT
CHLORAPREP W/TINT 26ML (MISCELLANEOUS) ×2 IMPLANT
CLIP LIGATING HEMO O LOK GREEN (MISCELLANEOUS) ×2 IMPLANT
CLSR STERI-STRIP ANTIMIC 1/2X4 (GAUZE/BANDAGES/DRESSINGS) ×2 IMPLANT
COVER SURGICAL LIGHT HANDLE (MISCELLANEOUS) ×2 IMPLANT
COVER TRANSDUCER ULTRASND (DRAPES) ×2 IMPLANT
DEVICE TROCAR PUNCTURE CLOSURE (ENDOMECHANICALS) ×2 IMPLANT
ELECT REM PT RETURN 9FT ADLT (ELECTROSURGICAL) ×2
ELECTRODE REM PT RTRN 9FT ADLT (ELECTROSURGICAL) ×1 IMPLANT
GAUZE SPONGE 2X2 8PLY STRL LF (GAUZE/BANDAGES/DRESSINGS) ×1 IMPLANT
GLOVE BIO SURGEON STRL SZ 6.5 (GLOVE) ×1 IMPLANT
GLOVE BIO SURGEON STRL SZ7.5 (GLOVE) ×2 IMPLANT
GLOVE BIOGEL PI IND STRL 7.0 (GLOVE) IMPLANT
GLOVE BIOGEL PI IND STRL 7.5 (GLOVE) IMPLANT
GLOVE BIOGEL PI INDICATOR 7.0 (GLOVE) ×1
GLOVE BIOGEL PI INDICATOR 7.5 (GLOVE) ×2
GLOVE ECLIPSE 7.5 STRL STRAW (GLOVE) ×1 IMPLANT
GOWN STRL REUS W/ TWL LRG LVL3 (GOWN DISPOSABLE) ×2 IMPLANT
GOWN STRL REUS W/ TWL XL LVL3 (GOWN DISPOSABLE) ×1 IMPLANT
GOWN STRL REUS W/TWL LRG LVL3 (GOWN DISPOSABLE) ×4
GOWN STRL REUS W/TWL XL LVL3 (GOWN DISPOSABLE) ×2
KIT BASIN OR (CUSTOM PROCEDURE TRAY) ×2 IMPLANT
KIT ROOM TURNOVER OR (KITS) ×2 IMPLANT
NDL INSUFFLATION 14GA 120MM (NEEDLE) ×1 IMPLANT
NEEDLE INSUFFLATION 14GA 120MM (NEEDLE) ×2 IMPLANT
NS IRRIG 1000ML POUR BTL (IV SOLUTION) ×2 IMPLANT
PAD ARMBOARD 7.5X6 YLW CONV (MISCELLANEOUS) ×4 IMPLANT
POUCH RETRIEVAL ECOSAC 10 (ENDOMECHANICALS) IMPLANT
POUCH RETRIEVAL ECOSAC 10MM (ENDOMECHANICALS) ×1
SCISSORS LAP 5X35 DISP (ENDOMECHANICALS) ×2 IMPLANT
SET IRRIG TUBING LAPAROSCOPIC (IRRIGATION / IRRIGATOR) ×2 IMPLANT
SLEEVE ENDOPATH XCEL 5M (ENDOMECHANICALS) ×2 IMPLANT
SPECIMEN JAR SMALL (MISCELLANEOUS) ×2 IMPLANT
SPONGE GAUZE 2X2 STER 10/PKG (GAUZE/BANDAGES/DRESSINGS) ×1
STRIP CLOSURE SKIN 1/2X4 (GAUZE/BANDAGES/DRESSINGS) ×1 IMPLANT
SUT MNCRL AB 3-0 PS2 18 (SUTURE) ×2 IMPLANT
TAPE CLOTH SURG 4X10 WHT LF (GAUZE/BANDAGES/DRESSINGS) ×1 IMPLANT
TOWEL OR 17X24 6PK STRL BLUE (TOWEL DISPOSABLE) ×2 IMPLANT
TOWEL OR 17X26 10 PK STRL BLUE (TOWEL DISPOSABLE) ×2 IMPLANT
TRAY LAPAROSCOPIC MC (CUSTOM PROCEDURE TRAY) ×2 IMPLANT
TROCAR XCEL NON-BLD 11X100MML (ENDOMECHANICALS) ×3 IMPLANT
TROCAR XCEL NON-BLD 5MMX100MML (ENDOMECHANICALS) ×2 IMPLANT
TUBING INSUFFLATION (TUBING) ×2 IMPLANT

## 2014-12-04 NOTE — Anesthesia Preprocedure Evaluation (Signed)
Anesthesia Evaluation  Patient identified by MRN, date of birth, ID band Patient awake    Reviewed: Allergy & Precautions, NPO status , Patient's Chart, lab work & pertinent test results  Airway Mallampati: II  TM Distance: >3 FB Neck ROM: Full    Dental   Pulmonary neg pulmonary ROS,    breath sounds clear to auscultation       Cardiovascular negative cardio ROS   Rhythm:Regular Rate:Normal     Neuro/Psych    GI/Hepatic negative GI ROS, Neg liver ROS,   Endo/Other  negative endocrine ROS  Renal/GU negative Renal ROS     Musculoskeletal   Abdominal   Peds  Hematology negative hematology ROS (+)   Anesthesia Other Findings   Reproductive/Obstetrics                             Anesthesia Physical Anesthesia Plan  ASA: II  Anesthesia Plan: General   Post-op Pain Management:    Induction: Intravenous, Rapid sequence and Cricoid pressure planned  Airway Management Planned: Oral ETT  Additional Equipment:   Intra-op Plan:   Post-operative Plan: Extubation in OR  Informed Consent: I have reviewed the patients History and Physical, chart, labs and discussed the procedure including the risks, benefits and alternatives for the proposed anesthesia with the patient or authorized representative who has indicated his/her understanding and acceptance.   Dental advisory given  Plan Discussed with: CRNA and Anesthesiologist  Anesthesia Plan Comments:         Anesthesia Quick Evaluation

## 2014-12-04 NOTE — Progress Notes (Signed)
One silver ring on left hand third finger. It is silver, has one clear stone and 2 blue stones

## 2014-12-04 NOTE — ED Notes (Signed)
Report given to [employer] at [employer].

## 2014-12-04 NOTE — Progress Notes (Signed)
Patient ID: Martha Vincent, female   DOB: Mar 22, 1965, 49 y.o.   MRN: [id number]    Subjective: Pt c/o HA and nausea.  Abdominal pain is less and really only hurts when pressed  Objective: Vital signs in last 24 hours: Temp:  [97.2 F (36.2 C)-97.9 F (36.6 C)] 97.9 F (36.6 C) (09/15 0342) Pulse Rate:  [61-84] 63 (09/15 0342) Resp:  [16-18] 16 (09/15 0342) BP: (129-170)/(76-97) 147/78 mmHg (09/15 0342) SpO2:  [96 %-100 %] 100 % (09/15 0342) Last BM Date: 12/02/14  Intake/Output from previous day: 09/14 0701 - 09/15 0700 In: 1193.3 [I.V.:1193.3] Out: 700 [Urine:700] Intake/Output this shift:    PE: Abd: soft, tender in RUQ, +BS, ND, obese Heart: regular Lungs: CTAB  Lab Results:   Recent Labs  12/03/14 2206  WBC 8.6  HGB 13.7  HCT 41.6  PLT 278   BMET  Recent Labs  12/03/14 2206 12/04/14 0453  NA 138 138  K 3.9 4.1  CL 104 109  CO2 25 24  GLUCOSE 119* 107*  BUN 15 10  CREATININE 0.99 0.78  CALCIUM 9.7 8.4*   PT/INR No results for input(s): LABPROT, INR in the last 72 hours. CMP     Component Value Date/Time   NA 138 12/04/2014 0453   K 4.1 12/04/2014 0453   CL 109 12/04/2014 0453   CO2 24 12/04/2014 0453   GLUCOSE 107* 12/04/2014 0453   BUN 10 12/04/2014 0453   CREATININE 0.78 12/04/2014 0453   CALCIUM 8.4* 12/04/2014 0453   PROT 5.6* 12/04/2014 0453   ALBUMIN 3.3* 12/04/2014 0453   AST 63* 12/04/2014 0453   ALT 57* 12/04/2014 0453   ALKPHOS 57 12/04/2014 0453   BILITOT 0.4 12/04/2014 0453   GFRNONAA >60 12/04/2014 0453   GFRAA >60 12/04/2014 0453   Lipase     Component Value Date/Time   LIPASE 22 12/03/2014 2206       Studies/Results: Ct Abdomen Pelvis W Contrast  12/04/2014   CLINICAL DATA:  49 year old female with right-sided abdominal pain  EXAM: CT ABDOMEN AND PELVIS WITH CONTRAST  TECHNIQUE: Multidetector CT imaging of the abdomen and pelvis was performed using the standard protocol following bolus administration of  intravenous contrast.  CONTRAST:  124mL OMNIPAQUE IOHEXOL 300 MG/ML  SOLN  COMPARISON:  None.  FINDINGS: The visualized lung bases are clear. No intra-abdominal free air or free fluid.  There is mild distention of the gallbladder. There is apparent thickening of the gallbladder wall with mucosal enhancement. Mild pericholecystic stranding and small pericholecystic fluid noted. No calcified stone identified. Findings concerning for acute cholecystitis. Ultrasound is recommended for better evaluation of the gallbladder.  A 1.5 cm left hepatic hypodense lesion is not well characterized but may represent a cyst or hemangioma. MRI may provide better evaluation. The pancreas, spleen, adrenal glands appear unremarkable. Subcentimeter right renal upper pole hypodense lesion is not well characterized but likely represents a cyst. There is no hydronephrosis or nephrolithiasis on either side. The visualized ureters and urinary bladder appear unremarkable. An intrauterine device is noted.  Moderate stool throughout the colon. No evidence of bowel obstruction or inflammation. Normal appendix.  The abdominal aorta and IVC appear unremarkable. No portal venous gas identified. There is no lymphadenopathy. Small fat containing umbilical hernia. Mild degenerative changes of the spine. No acute fracture.  IMPRESSION: Findings most compatible with acute cholecystitis. Ultrasound recommended for better evaluation of the gallbladder. No other acute intra-abdominal or pelvic pathology identified.   Electronically Signed   By:  Anner Crete M.D.   On: 12/04/2014 00:11    Anti-infectives: Anti-infectives    Start     Dose/Rate Route Frequency Ordered Stop   12/04/14 0800  cefTRIAXone (ROCEPHIN) 2 g in dextrose 5 % 50 mL IVPB     2 g 100 mL/hr over 30 Minutes Intravenous Every 24 hours 12/04/14 0345     12/04/14 0100  cefTRIAXone (ROCEPHIN) 1 g in dextrose 5 % 50 mL IVPB     1 g 100 mL/hr over 30 Minutes Intravenous  Once  12/04/14 0057 12/04/14 0220       Assessment/Plan  1. RUQ abdominal pain, possible acalculous cholecystitis -Dr. Rosendo Gros and I have reviewed her CT scan and her history.  It is likely her pain is coming from her gallbladder.  We will plan on lap chole today for resection.   -cont NPO -cont Rocephin D1, this can likely be DC after surgery -discussed procedure with patient and she is agreeable to proceed.   LOS: 0 days    Aly Seidenberg E 12/04/2014, 7:41 AM Pager: [phone]

## 2014-12-04 NOTE — Progress Notes (Signed)
Paged Dr. Barry Dienes [phone]-patient requesting Ambien to help her migraine.

## 2014-12-04 NOTE — H&P (Signed)
Martha Vincent is an 49 y.o. female.   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 She had right upper quadrant ultrasound earlier this year for symptoms of nausea that was negative for stones, but suspicious for polyps according to the patient.  We do not have a record of this study.  Pain has occurred mostly at night, but has not gotten better.  No diarrhea or constipation.  She has had significant bloating and belching associated with pain.  She came to ED and CT was performed given previous negative ultrasound.  She has a migraine now.    PMH: Migraines  Past Surgical History  Procedure Laterality Date  . Myomectomy    . Cesarean section      No family history on file. Social History:  reports that she has never smoked. She does not have any smokeless tobacco history on file. She reports that she drinks alcohol. She reports that she does not use illicit drugs.  Allergies:  Allergies  Allergen Reactions  . Bactrim [Sulfamethoxazole-Trimethoprim] Other (See Comments)    "neck stiffness"  . Sulfa Antibiotics Other (See Comments)    "neck stiffness"    Medications Prior to Admission  Medication Sig Dispense Refill  . acetaminophen (TYLENOL) 325 MG tablet Take 650 mg by mouth every 6 (six) hours as needed for mild pain or headache.    . ibuprofen (ADVIL,MOTRIN) 200 MG tablet Take 200 mg by mouth every 6 (six) hours as needed for headache or mild pain.    . Melatonin 10 MG TABS Take 10 mg by mouth as needed (for sleep).      Results for orders placed or performed during the hospital encounter of 12/03/14 (from the past 48 hour(s))  Lipase, blood     Status: None   Collection Time: 12/03/14 10:06 PM  Result Value Ref Range   Lipase 22 22 - 51 U/L  Comprehensive metabolic panel     Status: Abnormal   Collection Time: 12/03/14 10:06 PM  Result Value Ref Range   Sodium 138 135 - 145 mmol/L   Potassium 3.9 3.5 - 5.1 mmol/L   Chloride 104 101 - 111 mmol/L   CO2 25 22 - 32 mmol/L   Glucose, Bld 119 (H) 65 - 99 mg/dL   BUN 15 6 - 20 mg/dL   Creatinine, Ser 0.99 0.44 - 1.00 mg/dL   Calcium 9.7 8.9 - 10.3 mg/dL   Total Protein 7.3 6.5 - 8.1 g/dL   Albumin 4.0 3.5 - 5.0 g/dL   AST 35 15 - 41 U/L   ALT 30 14 - 54 U/L   Alkaline Phosphatase 71 38 - 126 U/L   Total Bilirubin 0.5 0.3 - 1.2 mg/dL   GFR calc non Af Amer >60 >60 mL/min   GFR calc Af Amer >60 >60 mL/min    Comment: (NOTE) The eGFR has been calculated using the CKD EPI equation. This calculation has not been validated in all clinical situations. eGFR's persistently <60 mL/min signify possible Chronic Kidney Disease.    Anion gap 9 5 - 15  CBC     Status: None   Collection Time: 12/03/14 10:06 PM  Result Value Ref Range   WBC 8.6 4.0 - 10.5 K/uL   RBC 4.55 3.87 - 5.11 MIL/uL   Hemoglobin 13.7 12.0 - 15.0 g/dL   HCT 41.6 36.0 - 46.0 %   MCV 91.4 78.0 -  100.0 fL   MCH 30.1 26.0 - 34.0 pg   MCHC 32.9 30.0 - 36.0 g/dL   RDW 13.1 11.5 - 15.5 %   Platelets 278 150 - 400 K/uL  Urinalysis, Routine w reflex microscopic (not at [facility])     Status: Abnormal   Collection Time: 12/04/14  1:31 AM  Result Value Ref Range   Color, Urine YELLOW YELLOW   APPearance CLOUDY (A) CLEAR   Specific Gravity, Urine 1.040 (H) 1.005 - 1.030   pH 5.5 5.0 - 8.0   Glucose, UA NEGATIVE NEGATIVE mg/dL   Hgb urine dipstick NEGATIVE NEGATIVE   Bilirubin Urine NEGATIVE NEGATIVE   Ketones, ur NEGATIVE NEGATIVE mg/dL   Protein, ur NEGATIVE NEGATIVE mg/dL   Urobilinogen, UA 0.2 0.0 - 1.0 mg/dL   Nitrite NEGATIVE NEGATIVE   Leukocytes, UA SMALL (A) NEGATIVE  Urine microscopic-add on     Status: Abnormal   Collection Time: 12/04/14  1:31 AM  Result Value Ref Range   Squamous Epithelial / LPF FEW (A) RARE   WBC, UA 7-10 <3 WBC/hpf   RBC / HPF 0-2 <3 RBC/hpf   Bacteria, UA RARE RARE  Pregnancy, urine     Status: None   Collection Time:  12/04/14  1:32 AM  Result Value Ref Range   Preg Test, Ur NEGATIVE NEGATIVE    Comment:        THE SENSITIVITY OF THIS METHODOLOGY IS >20 mIU/mL.   Comprehensive metabolic panel     Status: Abnormal   Collection Time: 12/04/14  4:53 AM  Result Value Ref Range   Sodium 138 135 - 145 mmol/L   Potassium 4.1 3.5 - 5.1 mmol/L   Chloride 109 101 - 111 mmol/L   CO2 24 22 - 32 mmol/L   Glucose, Bld 107 (H) 65 - 99 mg/dL   BUN 10 6 - 20 mg/dL   Creatinine, Ser 0.78 0.44 - 1.00 mg/dL   Calcium 8.4 (L) 8.9 - 10.3 mg/dL   Total Protein 5.6 (L) 6.5 - 8.1 g/dL   Albumin 3.3 (L) 3.5 - 5.0 g/dL   AST 63 (H) 15 - 41 U/L   ALT 57 (H) 14 - 54 U/L   Alkaline Phosphatase 57 38 - 126 U/L   Total Bilirubin 0.4 0.3 - 1.2 mg/dL   GFR calc non Af Amer >60 >60 mL/min   GFR calc Af Amer >60 >60 mL/min    Comment: (NOTE) The eGFR has been calculated using the CKD EPI equation. This calculation has not been validated in all clinical situations. eGFR's persistently <60 mL/min signify possible Chronic Kidney Disease.    Anion gap 5 5 - 15   Ct Abdomen Pelvis W Contrast  12/04/2014   CLINICAL DATA:  49 year old female with right-sided abdominal pain  EXAM: CT ABDOMEN AND PELVIS WITH CONTRAST  TECHNIQUE: Multidetector CT imaging of the abdomen and pelvis was performed using the standard protocol following bolus administration of intravenous contrast.  CONTRAST:  170m OMNIPAQUE IOHEXOL 300 MG/ML  SOLN  COMPARISON:  None.  FINDINGS: The visualized lung bases are clear. No intra-abdominal free air or free fluid.  There is mild distention of the gallbladder. There is apparent thickening of the gallbladder wall with mucosal enhancement. Mild pericholecystic stranding and small pericholecystic fluid noted. No calcified stone identified. Findings concerning for acute cholecystitis. Ultrasound is recommended for better evaluation of the gallbladder.  A 1.5 cm left hepatic hypodense lesion is not well characterized but may  represent a cyst or  hemangioma. MRI may provide better evaluation. The pancreas, spleen, adrenal glands appear unremarkable. Subcentimeter right renal upper pole hypodense lesion is not well characterized but likely represents a cyst. There is no hydronephrosis or nephrolithiasis on either side. The visualized ureters and urinary bladder appear unremarkable. An intrauterine device is noted.  Moderate stool throughout the colon. No evidence of bowel obstruction or inflammation. Normal appendix.  The abdominal aorta and IVC appear unremarkable. No portal venous gas identified. There is no lymphadenopathy. Small fat containing umbilical hernia. Mild degenerative changes of the spine. No acute fracture.  IMPRESSION: Findings most compatible with acute cholecystitis. Ultrasound recommended for better evaluation of the gallbladder. No other acute intra-abdominal or pelvic pathology identified.   Electronically Signed   By: Anner Crete M.D.   On: 12/04/2014 00:11    Review of Systems  Constitutional: Negative.   HENT: Negative.   Eyes: Negative.   Respiratory: Negative.   Cardiovascular: Negative.   Gastrointestinal: Positive for nausea and abdominal pain.  Genitourinary: Negative.   Musculoskeletal: Negative.   Skin: Negative.   Neurological: Negative.   Endo/Heme/Allergies: Negative.   Psychiatric/Behavioral: Negative.     Blood pressure 147/78, pulse 63, temperature 97.9 F (36.6 C), temperature source Oral, resp. rate 16, height 5' 10.08" (1.78 m), SpO2 100 %. Physical Exam  Constitutional: She is oriented to person, place, and time. She appears well-developed and well-nourished. No distress.  HENT:  Head: Normocephalic and atraumatic.  Eyes: Conjunctivae are normal. Pupils are equal, round, and reactive to light. Right eye exhibits no discharge. Left eye exhibits no discharge. No scleral icterus.  Neck: Normal range of motion. Neck supple. No thyromegaly present.  Cardiovascular: Normal  rate and intact distal pulses.   Respiratory: Effort normal. No respiratory distress. She exhibits no tenderness.  GI: Soft. She exhibits distension (mildly). There is tenderness (RUQ). There is no rebound and no guarding.  Musculoskeletal: Normal range of motion. She exhibits no edema.  Neurological: She is alert and oriented to person, place, and time.  Skin: Skin is warm and dry. No rash noted. She is not diaphoretic. No erythema. No pallor.  Psychiatric: She has a normal mood and affect. Her behavior is normal. Judgment and thought content normal.     Assessment/Plan Acute acalculous cholecystitis Will discuss with Dr. Rosendo Gros.  He may want HIDA scan since CT demonstrates only mild signs of cholecystitis Will cover with rocephin empirically NPO   Martha Vincent 12/04/2014, 6:51 AM

## 2014-12-04 NOTE — Anesthesia Postprocedure Evaluation (Signed)
  Anesthesia Post-op Note  Patient: Martha Vincent  Procedure(s) Performed: Procedure(s): LAPAROSCOPIC CHOLECYSTECTOMY (N/A)  Patient Location: PACU  Anesthesia Type:General  Level of Consciousness: awake  Airway and Oxygen Therapy: Patient Spontanous Breathing  Post-op Pain: mild  Post-op Assessment: Post-op Vital signs reviewed              Post-op Vital Signs: Reviewed  Last Vitals:  Filed Vitals:   12/04/14 1330  BP:   Pulse: 65  Temp:   Resp: 10    Complications: No apparent anesthesia complications

## 2014-12-04 NOTE — Op Note (Signed)
12/04/2014  11:28 AM  PATIENT:  Martha Vincent  49 y.o. female  PRE-OPERATIVE DIAGNOSIS:  acute cholecystitis  POST-OPERATIVE DIAGNOSIS:  acute cholecystitis  PROCEDURE:  Procedure(s): LAPAROSCOPIC CHOLECYSTECTOMY (N/A)  SURGEON:  Surgeon(s) and Role:    * Ralene Ok, MD - Primary  ANESTHESIA:   local and general  EBL:   5cc  BLOOD ADMINISTERED:none  DRAINS: none   LOCAL MEDICATIONS USED:  BUPIVICAINE   SPECIMEN:  Source of Specimen:  gallbladder  DISPOSITION OF SPECIMEN:  PATHOLOGY  COUNTS:  YES  TOURNIQUET:  * No tourniquets in log *  DICTATION: .Dragon Dictation The patient was taken to the operating and placed in the supine position with bilateral SCDs in place. The patient was prepped and draped in the usual sterile fashion. A time out was called and all facts were verified. A pneumoperitoneum was obtained via A Veress needle technique to a pressure of 69mm of mercury.  A 61mm trochar was then placed in the right upper quadrant under visualization, and there were no injuries to any abdominal organs. A 11 mm port was then placed in the umbilical region after infiltrating with local anesthesia under direct visualization. A second and third epigastric port and right lower quadrant port placement under direct visualization, respectively.  The gallbladder was identified and retracted, the peritoneum was then sharply dissected from the gallbladder and this dissection was carried down to Calot's triangle. The gallbladder was identified and stripped away circumferentially and seen going into the gallbladder 360, the critical angle was obtained.  2 clips were placed proximally one distally and the cystic duct transected. The cystic artery was identified and 2 clips placed proximally and one distally and transected. We then proceeded to remove the gallbladder off the hepatic fossa with Bovie cautery. A retrieval bag was then placed in the abdomen and gallbladder placed in the  bag. The hepatic fossa was then reexamined and hemostasis was achieved with Bovie cautery and was excellent at the end of the case. The subhepatic fossa and perihepatic fossa was then irrigated until the effluent was clear. The specimen and the bag were removed from teh abdominal cavity.  The 11 mm trocar fascia was reapproximated with the Endo Close #1 Vicryl x2. The pneumoperitoneum was evacuated and all trochars removed under direct visulalization. The skin was then closed with 4-0 Monocryl and the skin dressed with Steri-Strips, gauze, and tape. The patient was awaken from general anesthesia and taken to the recovery room in stable condition.   PLAN OF CARE: Admit for overnight observation  PATIENT DISPOSITION:  PACU - hemodynamically stable.   Delay start of Pharmacological VTE agent (>24hrs) due to surgical blood loss or risk of bleeding: not applicable

## 2014-12-04 NOTE — Transfer of Care (Signed)
Immediate Anesthesia Transfer of Care Note  Patient: Martha Vincent  Procedure(s) Performed: Procedure(s): LAPAROSCOPIC CHOLECYSTECTOMY (N/A)  Patient Location: PACU  Anesthesia Type:General  Level of Consciousness: sedated, patient cooperative and responds to stimulation  Airway & Oxygen Therapy: Patient Spontanous Breathing  Post-op Assessment: Report given to RN, Post -op Vital signs reviewed and stable and Patient moving all extremities  Post vital signs: Reviewed and stable  Last Vitals:  Filed Vitals:   12/04/14 0851  BP: 170/83  Pulse: 68  Temp: 36.5 C  Resp: 16    Complications: No apparent anesthesia complications

## 2014-12-04 NOTE — Anesthesia Procedure Notes (Signed)
Procedure Name: Intubation Date/Time: 12/04/2014 10:30 AM Performed by: Rebekah Chesterfield L Pre-anesthesia Checklist: Patient identified, Emergency Drugs available, Suction available, Patient being monitored and Timeout performed Patient Re-evaluated:Patient Re-evaluated prior to inductionOxygen Delivery Method: Circle system utilized Preoxygenation: Pre-oxygenation with 100% oxygen Intubation Type: Cricoid Pressure applied, Rapid sequence and IV induction Laryngoscope Size: Mac and 3 Grade View: Grade I Tube type: Oral Tube size: 7.5 mm Number of attempts: 1 Airway Equipment and Method: Stylet Placement Confirmation: ETT inserted through vocal cords under direct vision,  breath sounds checked- equal and bilateral and positive ETCO2 Secured at: 21 cm Tube secured with: Tape Dental Injury: Teeth and Oropharynx as per pre-operative assessment

## 2014-12-05 ENCOUNTER — Encounter (HOSPITAL_COMMUNITY): Payer: Self-pay | Admitting: General Surgery

## 2014-12-05 MED ORDER — OXYCODONE HCL 5 MG PO TABS: 5.0000 mg | ORAL_TABLET | ORAL | Status: DC | PRN

## 2014-12-05 NOTE — Discharge Summary (Signed)
Patient ID: Martha Vincent MRN: [id number] DOB/AGE: 10-18-1965 49 y.o.  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 She had right upper quadrant ultrasound earlier this year for symptoms of nausea that was negative for stones, but suspicious for polyps according to the patient. We do not have a record of this study. Pain has occurred mostly at night, but has not gotten better. No diarrhea or constipation. She has had significant bloating and belching associated with pain. She came to ED and CT was performed given previous negative ultrasound. She has a migraine now.   Admission Diagnoses:  1. Acalculous cholecysitis  Hospital Course: the patient was admitted and started on rocephin.  She was taken to the OR and underwent the above procedure.  She was found to have acute acalculous cholecystitis.  She tolerated her procedure well.  On POD 1, she was tolerating a regular diet and her pain was well controlled with oral pain medications.  She was stable for dc home.  Her pre-op symptoms had resolved.  PE: Abd: soft, appropriately tender, +BS, ND, incisions c/d/i Heart: regular Lungs: CTAB  Discharge Diagnoses:  Active Problems:   Acute cholecystitis   Acute acalculous cholecystitis s/p lap chole  Discharge Medications:   Medication List    TAKE these medications        acetaminophen 325 MG tablet  Commonly known as:  TYLENOL  Take 650 mg by mouth every 6 (six) hours as needed for mild pain or headache.     ibuprofen 200 MG tablet  Commonly known as:  ADVIL,MOTRIN  Take 200 mg by mouth every 6 (six) hours as needed for headache or mild pain.     Melatonin 10 MG Tabs  Take 10 mg by mouth as needed (for sleep).     oxyCODONE 5 MG immediate release tablet  Commonly known as:  Oxy IR/ROXICODONE  Take 1-2 tablets  (5-10 mg total) by mouth every 4 (four) hours as needed for moderate pain.        Discharge Instructions:     Follow-up Information    Follow up with [facility] On 12/23/2014.   Specialty:  General Surgery   Why:  Doc of the [facility], 2:30pm, arrive no later than 2:00pm for paperwork   Contact information:   [place] [address] [phone]       Signed: Henreitta Cea 12/05/2014, 8:57 AM

## 2014-12-05 NOTE — Progress Notes (Signed)
Discharge home. Home discharge instruction regarding wound care and med given, no question verbalized.

## 2014-12-05 NOTE — Discharge Instructions (Signed)
[facility] ______[facility] °LAPAROSCOPIC SURGERY: POST OP INSTRUCTIONS °Always review your discharge instruction sheet given to you by the facility where your surgery was performed. °IF YOU HAVE DISABILITY OR FAMILY LEAVE FORMS, YOU MUST BRING THEM TO THE OFFICE FOR PROCESSING.   °DO NOT GIVE THEM TO YOUR DOCTOR. ° °1. A prescription for pain medication may be given to you upon discharge.  Take your pain medication as prescribed, if needed.  If narcotic pain medicine is not needed, then you may take acetaminophen (Tylenol) or ibuprofen (Advil) as needed. °2. Take your usually prescribed medications unless otherwise directed. °3. If you need a refill on your pain medication, please contact your pharmacy.  They will contact our office to request authorization. Prescriptions will not be filled after 5pm or on week-ends. °4. You should follow a light diet the first few days after arrival home, such as soup and crackers, etc.  Be sure to include lots of fluids daily. °5. Most patients will experience some swelling and bruising in the area of the incisions.  Ice packs will help.  Swelling and bruising can take several days to resolve.  °6. It is common to experience some constipation if taking pain medication after surgery.  Increasing fluid intake and taking a stool softener (such as Colace) will usually help or prevent this problem from occurring.  A mild laxative (Milk of Magnesia or Miralax) should be taken according to package instructions if there are no bowel movements after 48 hours. °7. Unless discharge instructions indicate otherwise, you may remove your bandages 24-48 hours after surgery, and you may shower at that time.  You may have steri-strips (small skin tapes) in place directly over the incision.  These strips should be left on the skin for 7-10 days.  If your surgeon used skin glue on the incision, you may shower in 24 hours.  The glue will flake off over the next 2-3 weeks.  Any sutures or  staples will be removed at the office during your follow-up visit. °8. ACTIVITIES:  You may resume regular (light) daily activities beginning the next day--such as daily self-care, walking, climbing stairs--gradually increasing activities as tolerated.  You may have sexual intercourse when it is comfortable.  Refrain from any heavy lifting or straining until approved by your doctor. °a. You may drive when you are no longer taking prescription pain medication, you can comfortably wear a seatbelt, and you can safely maneuver your car and apply brakes. °b. RETURN TO WORK:  __________________________________________________________ °9. You should see your doctor in the office for a follow-up appointment approximately 2-3 weeks after your surgery.  Make sure that you call for this appointment within a day or two after you arrive home to insure a convenient appointment time. °10. OTHER INSTRUCTIONS: __________________________________________________________________________________________________________________________ __________________________________________________________________________________________________________________________ °WHEN TO CALL YOUR DOCTOR: °1. Fever over 101.0 °2. Inability to urinate °3. Continued bleeding from incision. °4. Increased pain, redness, or drainage from the incision. °5. Increasing abdominal pain ° °The clinic staff is available to answer your questions during regular business hours.  Please don’t hesitate to call and ask to speak to one of the nurses for clinical concerns.  If you have a medical emergency, go to the nearest emergency room or call 911.  A surgeon from [facility] is always on call at the hospital. °[address], [address], [place], [place]  [address] ? [address] [address], [place], [place]   [address] °[phone] ? [phone] ? FAX [phone] °Web site:   [email or web address] °

## 2014-12-08 ENCOUNTER — Encounter: Payer: Self-pay | Admitting: Gynecology

## 2015-05-01 DIAGNOSIS — M25562 Pain in left knee: Secondary | ICD-10-CM | POA: Diagnosis not present

## 2015-05-01 DIAGNOSIS — M25561 Pain in right knee: Secondary | ICD-10-CM | POA: Diagnosis not present

## 2015-05-07 ENCOUNTER — Ambulatory Visit
Admission: RE | Admit: 2015-05-07 | Discharge: 2015-05-07 | Disposition: A | Discharge status: Home / Self Care | Payer: 59 | Source: Ambulatory Visit | Attending: Orthopedic Surgery | Admitting: Orthopedic Surgery

## 2015-05-07 ENCOUNTER — Other Ambulatory Visit: Payer: Self-pay

## 2015-05-07 ENCOUNTER — Other Ambulatory Visit: Payer: Self-pay | Admitting: Orthopedic Surgery

## 2015-05-07 DIAGNOSIS — G8929 Other chronic pain: Secondary | ICD-10-CM

## 2015-05-07 DIAGNOSIS — Z1231 Encounter for screening mammogram for malignant neoplasm of breast: Secondary | ICD-10-CM

## 2015-05-07 DIAGNOSIS — M25561 Pain in right knee: Principal | ICD-10-CM

## 2015-05-07 DIAGNOSIS — S83241A Other tear of medial meniscus, current injury, right knee, initial encounter: Secondary | ICD-10-CM | POA: Diagnosis not present

## 2015-05-08 DIAGNOSIS — M7651 Patellar tendinitis, right knee: Secondary | ICD-10-CM | POA: Diagnosis not present

## 2015-05-08 DIAGNOSIS — M94261 Chondromalacia, right knee: Secondary | ICD-10-CM | POA: Diagnosis not present

## 2015-05-08 DIAGNOSIS — S83241D Other tear of medial meniscus, current injury, right knee, subsequent encounter: Secondary | ICD-10-CM | POA: Diagnosis not present

## 2015-05-08 DIAGNOSIS — M25561 Pain in right knee: Secondary | ICD-10-CM | POA: Diagnosis not present

## 2015-05-09 ENCOUNTER — Other Ambulatory Visit: Payer: Self-pay

## 2015-05-13 ENCOUNTER — Ambulatory Visit
Admission: RE | Admit: 2015-05-13 | Discharge: 2015-05-13 | Disposition: A | Discharge status: Home / Self Care | Payer: 59 | Source: Ambulatory Visit

## 2015-05-13 DIAGNOSIS — Z1231 Encounter for screening mammogram for malignant neoplasm of breast: Secondary | ICD-10-CM | POA: Diagnosis not present

## 2015-05-18 ENCOUNTER — Encounter (HOSPITAL_BASED_OUTPATIENT_CLINIC_OR_DEPARTMENT_OTHER): Payer: Self-pay | Admitting: *Deleted

## 2015-05-20 ENCOUNTER — Encounter (HOSPITAL_BASED_OUTPATIENT_CLINIC_OR_DEPARTMENT_OTHER): Payer: Self-pay | Admitting: *Deleted

## 2015-05-20 NOTE — Progress Notes (Signed)
NPO AFTER MN.  ARRIVE AT 0720.  NEEDS HG.

## 2015-05-21 ENCOUNTER — Encounter: Payer: Self-pay | Admitting: Sports Medicine

## 2015-05-21 ENCOUNTER — Ambulatory Visit (INDEPENDENT_AMBULATORY_CARE_PROVIDER_SITE_OTHER): Payer: 59 | Admitting: Sports Medicine

## 2015-05-21 VITALS — BP 138/80 | Ht 70.0 in | Wt 235.0 lb

## 2015-05-21 DIAGNOSIS — M23306 Other meniscus derangements, unspecified meniscus, right knee: Secondary | ICD-10-CM | POA: Insufficient documentation

## 2015-05-21 DIAGNOSIS — M25461 Effusion, right knee: Secondary | ICD-10-CM | POA: Diagnosis not present

## 2015-05-21 DIAGNOSIS — M25561 Pain in right knee: Secondary | ICD-10-CM | POA: Insufficient documentation

## 2015-05-21 DIAGNOSIS — M25469 Effusion, unspecified knee: Secondary | ICD-10-CM | POA: Insufficient documentation

## 2015-05-21 DIAGNOSIS — S83241D Other tear of medial meniscus, current injury, right knee, subsequent encounter: Secondary | ICD-10-CM | POA: Diagnosis not present

## 2015-05-21 NOTE — Assessment & Plan Note (Signed)
Can wear brace if comfortable and more stable  I think she would benefit from PT and possibly some compression over knee

## 2015-05-21 NOTE — Progress Notes (Signed)
[name], Dr., female   DOB: Aug 23, 1965, 50 y.o.   MRN: [id number]  f CC: knee pain and locking RT  HPI 2 years ago patient slipped on wet bath mats and did a split Pain and effusion in both knees This gradually resolved with no surgery  2 mos ago going up ramp escalator at airport [place] forward onto hands and knees Since then RT medial knee pain She periodically gets some locking - briefly Has not given way again  However, is she walks up steps she feels that it could give way She saw swelling at first and some has persisted  Xray done at [facility] - ? 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 Probable effusion in SPP Palpation shows mild med. joint line tenderness  There is TTP over proximal gastroc on medial post knee No patellar tenderness or condyle tenderness. Some bilat crepitation under patella  ROM normal in flexion and extension on left/ on RT knee she has 2 to 3 degrees less than full extension/ pain at 135 deg of flexion vs 150 on lt.   Ligaments with solid consistent endpoints including ACL, PCL, LCL, MCL. Negative Mcmurray's and provocative meniscal tests but does feel more pressure on med. Joint  Non painful patellar compression. Patellar and  quadriceps tendons unremarkable. Hamstring and quadriceps strength is normal.  [place] of knee There is SPP effusion but this is mild to moderate and smaller than observed on MRI QT and PT intact and normal Lat. Meniscus is normal Medial meniscus is normal ant and post horns/ at midline there is degenerative change with a small fragment extruded There is a small perimeniscal cyst at this point as well  Gastroc normal/ no bakers cyst  MRI reviewed Mod to large effusion Ligaments intact degnerative meniscal splits medially Lateral has some signal change but no real splits

## 2015-05-21 NOTE — Assessment & Plan Note (Signed)
If mechanical sxs are not too severe she could start with good PT program to see if this will respond  Arthroscopy is not unreasonable if it keeps locking  MRI and [place] both show only small tears so reasonable prospect of healing with conservative care

## 2015-05-25 ENCOUNTER — Encounter (HOSPITAL_BASED_OUTPATIENT_CLINIC_OR_DEPARTMENT_OTHER): Payer: Self-pay | Admitting: Anesthesiology

## 2015-05-25 ENCOUNTER — Ambulatory Visit (HOSPITAL_BASED_OUTPATIENT_CLINIC_OR_DEPARTMENT_OTHER)
Admission: RE | Admit: 2015-05-25 | Discharge: 2015-05-25 | Disposition: A | Discharge status: Home / Self Care | Payer: 59 | Source: Ambulatory Visit | Attending: Orthopedic Surgery | Admitting: Orthopedic Surgery

## 2015-05-25 ENCOUNTER — Ambulatory Visit (HOSPITAL_BASED_OUTPATIENT_CLINIC_OR_DEPARTMENT_OTHER): Payer: 59 | Admitting: Anesthesiology

## 2015-05-25 ENCOUNTER — Encounter (HOSPITAL_BASED_OUTPATIENT_CLINIC_OR_DEPARTMENT_OTHER)
Admission: RE | Disposition: A | Discharge status: Home / Self Care | Payer: Self-pay | Source: Ambulatory Visit | Attending: Orthopedic Surgery

## 2015-05-25 DIAGNOSIS — M23321 Other meniscus derangements, posterior horn of medial meniscus, right knee: Secondary | ICD-10-CM | POA: Diagnosis not present

## 2015-05-25 DIAGNOSIS — Z79899 Other long term (current) drug therapy: Secondary | ICD-10-CM | POA: Diagnosis not present

## 2015-05-25 DIAGNOSIS — R269 Unspecified abnormalities of gait and mobility: Secondary | ICD-10-CM | POA: Diagnosis not present

## 2015-05-25 DIAGNOSIS — M9420 Chondromalacia, unspecified site: Secondary | ICD-10-CM | POA: Insufficient documentation

## 2015-05-25 DIAGNOSIS — S83241A Other tear of medial meniscus, current injury, right knee, initial encounter: Secondary | ICD-10-CM | POA: Insufficient documentation

## 2015-05-25 DIAGNOSIS — M2241 Chondromalacia patellae, right knee: Secondary | ICD-10-CM | POA: Diagnosis not present

## 2015-05-25 HISTORY — DX: Chondromalacia, unspecified knee: M94.269

## 2015-05-25 HISTORY — DX: Personal history of cervical dysplasia: Z87.410

## 2015-05-25 HISTORY — PX: KNEE ARTHROSCOPY: SHX127

## 2015-05-25 HISTORY — DX: Personal history of gestational diabetes: Z86.32

## 2015-05-25 HISTORY — DX: Personal history of other benign neoplasm: Z86.018

## 2015-05-25 LAB — POCT HEMOGLOBIN-HEMACUE: Hemoglobin: 13.6 g/dL (ref 12.0–15.0)

## 2015-05-25 SURGERY — ARTHROSCOPY, KNEE
Anesthesia: General | Site: Knee | Laterality: Right

## 2015-05-25 MED ORDER — MEPERIDINE HCL 25 MG/ML IJ SOLN
6.2500 mg | INTRAMUSCULAR | Status: DC | PRN
  Filled 2015-05-25: qty 1

## 2015-05-25 MED ORDER — CHLORHEXIDINE GLUCONATE 4 % EX LIQD
60.0000 mL | Freq: Once | CUTANEOUS | Status: DC
  Filled 2015-05-25: qty 60

## 2015-05-25 MED ORDER — LIDOCAINE HCL (CARDIAC) 20 MG/ML IV SOLN
INTRAVENOUS | Status: DC | PRN
  Administered 2015-05-25: 100 mg via INTRAVENOUS

## 2015-05-25 MED ORDER — HYDROCODONE-ACETAMINOPHEN 5-325 MG PO TABS: 1.0000 | ORAL_TABLET | Freq: Four times a day (QID) | ORAL | Status: DC | PRN

## 2015-05-25 MED ORDER — PROPOFOL 10 MG/ML IV BOLUS
INTRAVENOUS | Status: DC | PRN
  Administered 2015-05-25: 50 mg via INTRAVENOUS
  Administered 2015-05-25: 250 mg via INTRAVENOUS

## 2015-05-25 MED ORDER — FENTANYL CITRATE (PF) 100 MCG/2ML IJ SOLN
INTRAMUSCULAR | Status: AC
  Filled 2015-05-25: qty 2

## 2015-05-25 MED ORDER — LACTATED RINGERS IV SOLN
INTRAVENOUS | Status: DC
  Administered 2015-05-25: 08:00:00 via INTRAVENOUS
  Filled 2015-05-25: qty 1000

## 2015-05-25 MED ORDER — CEFAZOLIN SODIUM-DEXTROSE 2-3 GM-% IV SOLR
2.0000 g | INTRAVENOUS | Status: AC
  Administered 2015-05-25: 2 g via INTRAVENOUS
  Filled 2015-05-25: qty 50

## 2015-05-25 MED ORDER — LIDOCAINE-EPINEPHRINE (PF) 1 %-1:200000 IJ SOLN
INTRAMUSCULAR | Status: DC | PRN
  Administered 2015-05-25: 30 mL

## 2015-05-25 MED ORDER — LACTATED RINGERS IR SOLN
Status: DC | PRN
  Administered 2015-05-25: 6000 mL

## 2015-05-25 MED ORDER — ONDANSETRON HCL 4 MG/2ML IJ SOLN
INTRAMUSCULAR | Status: AC
  Filled 2015-05-25: qty 2

## 2015-05-25 MED ORDER — LIDOCAINE HCL (CARDIAC) 20 MG/ML IV SOLN
INTRAVENOUS | Status: AC
  Filled 2015-05-25: qty 5

## 2015-05-25 MED ORDER — MIDAZOLAM HCL 5 MG/5ML IJ SOLN
INTRAMUSCULAR | Status: DC | PRN
  Administered 2015-05-25: 2 mg via INTRAVENOUS

## 2015-05-25 MED ORDER — PROMETHAZINE HCL 25 MG/ML IJ SOLN
6.2500 mg | INTRAMUSCULAR | Status: DC | PRN
  Filled 2015-05-25: qty 1

## 2015-05-25 MED ORDER — HYDROCODONE-ACETAMINOPHEN 5-325 MG PO TABS
1.0000 | ORAL_TABLET | Freq: Once | ORAL | Status: AC
  Administered 2015-05-25: 1 via ORAL
  Filled 2015-05-25: qty 1

## 2015-05-25 MED ORDER — LACTATED RINGERS IV SOLN
INTRAVENOUS | Status: DC
  Filled 2015-05-25: qty 1000

## 2015-05-25 MED ORDER — FENTANYL CITRATE (PF) 100 MCG/2ML IJ SOLN
INTRAMUSCULAR | Status: DC | PRN
  Administered 2015-05-25: 50 ug via INTRAVENOUS
  Administered 2015-05-25: 100 ug via INTRAVENOUS
  Administered 2015-05-25: 50 ug via INTRAVENOUS

## 2015-05-25 MED ORDER — KETOROLAC TROMETHAMINE 30 MG/ML IJ SOLN
INTRAMUSCULAR | Status: AC
  Filled 2015-05-25: qty 1

## 2015-05-25 MED ORDER — STERILE WATER FOR IRRIGATION IR SOLN
Status: DC | PRN
  Administered 2015-05-25: 500 mL

## 2015-05-25 MED ORDER — HYDROCODONE-ACETAMINOPHEN 5-325 MG PO TABS
ORAL_TABLET | ORAL | Status: AC
  Filled 2015-05-25: qty 1

## 2015-05-25 MED ORDER — MIDAZOLAM HCL 2 MG/2ML IJ SOLN
INTRAMUSCULAR | Status: AC
  Filled 2015-05-25: qty 2

## 2015-05-25 MED ORDER — PROPOFOL 10 MG/ML IV BOLUS
INTRAVENOUS | Status: AC
  Filled 2015-05-25: qty 40

## 2015-05-25 MED ORDER — KETOROLAC TROMETHAMINE 30 MG/ML IJ SOLN
INTRAMUSCULAR | Status: DC | PRN
  Administered 2015-05-25: 30 mg via INTRAVENOUS

## 2015-05-25 MED ORDER — DEXAMETHASONE SODIUM PHOSPHATE 4 MG/ML IJ SOLN
INTRAMUSCULAR | Status: DC | PRN
  Administered 2015-05-25: 10 mg via INTRAVENOUS

## 2015-05-25 MED ORDER — CEFAZOLIN SODIUM-DEXTROSE 2-3 GM-% IV SOLR
INTRAVENOUS | Status: AC
  Filled 2015-05-25: qty 50

## 2015-05-25 MED ORDER — FENTANYL CITRATE (PF) 100 MCG/2ML IJ SOLN
25.0000 ug | INTRAMUSCULAR | Status: DC | PRN
  Filled 2015-05-25: qty 1

## 2015-05-25 MED ORDER — MELOXICAM 7.5 MG PO TABS: 15.0000 mg | ORAL_TABLET | Freq: Every day | ORAL | Status: DC

## 2015-05-25 MED ORDER — DEXAMETHASONE SODIUM PHOSPHATE 10 MG/ML IJ SOLN
INTRAMUSCULAR | Status: AC
  Filled 2015-05-25: qty 1

## 2015-05-25 MED ORDER — ONDANSETRON HCL 4 MG/2ML IJ SOLN
INTRAMUSCULAR | Status: DC | PRN
  Administered 2015-05-25: 4 mg via INTRAVENOUS

## 2015-05-25 MED ORDER — BUPIVACAINE-EPINEPHRINE 0.25% -1:200000 IJ SOLN
INTRAMUSCULAR | Status: DC | PRN
  Administered 2015-05-25: 30 mL

## 2015-05-25 MED ORDER — WHITE PETROLATUM GEL
Status: AC
  Filled 2015-05-25: qty 5

## 2015-05-25 MED ORDER — ASPIRIN EC 325 MG PO TBEC: 325.0000 mg | DELAYED_RELEASE_TABLET | Freq: Every day | ORAL | Status: DC

## 2015-05-25 MED FILL — HYDROCODON-APAP 5-325: 5-325 | 7 days supply | Qty: 60 | Fill #0

## 2015-05-25 MED FILL — MELOXICAM 7.5 MG TABLET: 7.5 | 45 days supply | Qty: 90 | Fill #0

## 2015-05-25 SURGICAL SUPPLY — 31 items
BANDAGE ELASTIC 6 VELCRO ST LF (GAUZE/BANDAGES/DRESSINGS) ×2 IMPLANT
BLADE 4.2CUDA (BLADE) IMPLANT
BLADE CUDA SHAVER 3.5 (BLADE) ×2 IMPLANT
DRAPE ARTHROSCOPY W/POUCH 114 (DRAPES) ×2 IMPLANT
DRSG EMULSION OIL 3X3 NADH (GAUZE/BANDAGES/DRESSINGS) ×2 IMPLANT
DRSG PAD ABDOMINAL 8X10 ST (GAUZE/BANDAGES/DRESSINGS) ×1 IMPLANT
DURAPREP 26ML APPLICATOR (WOUND CARE) ×2 IMPLANT
ELECT MENISCUS 165MM 90D (ELECTRODE) IMPLANT
ELECT REM PT RETURN 9FT ADLT (ELECTROSURGICAL)
ELECTRODE REM PT RTRN 9FT ADLT (ELECTROSURGICAL) IMPLANT
GLOVE BIO SURGEON STRL SZ7.5 (GLOVE) ×2 IMPLANT
GLOVE SURG SS PI 6.5 STRL IVOR (GLOVE) ×1 IMPLANT
GLOVE SURG SS PI 7.5 STRL IVOR (GLOVE) ×1 IMPLANT
GOWN STRL REUS W/ TWL LRG LVL3 (GOWN DISPOSABLE) ×1 IMPLANT
GOWN STRL REUS W/TWL LRG LVL3 (GOWN DISPOSABLE) ×2
KIT ROOM TURNOVER WOR (KITS) ×2 IMPLANT
KNEE WRAP E Z 3 GEL PACK (MISCELLANEOUS) ×2 IMPLANT
MANIFOLD NEPTUNE II (INSTRUMENTS) ×2 IMPLANT
PACK ARTHROSCOPY DSU (CUSTOM PROCEDURE TRAY) ×2 IMPLANT
PACK BASIN DAY SURGERY FS (CUSTOM PROCEDURE TRAY) ×2 IMPLANT
PADDING CAST COTTON 6X4 STRL (CAST SUPPLIES) ×1 IMPLANT
PENCIL BUTTON HOLSTER BLD 10FT (ELECTRODE) IMPLANT
SET ARTHROSCOPY TUBING (MISCELLANEOUS) ×2
SET ARTHROSCOPY TUBING LN (MISCELLANEOUS) ×1 IMPLANT
SPONGE GAUZE 4X4 12PLY STER LF (GAUZE/BANDAGES/DRESSINGS) ×2 IMPLANT
SUT ETHILON 4 0 PS 2 18 (SUTURE) ×2 IMPLANT
SYR 30ML LL (SYRINGE) ×2 IMPLANT
SYR CONTROL 10ML LL (SYRINGE) ×2 IMPLANT
TOWEL OR 17X24 6PK STRL BLUE (TOWEL DISPOSABLE) ×2 IMPLANT
TUBE CONNECTING 12X1/4 (SUCTIONS) ×2 IMPLANT
WATER STERILE IRR 500ML POUR (IV SOLUTION) ×2 IMPLANT

## 2015-05-25 NOTE — Transfer of Care (Signed)
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

## 2015-05-25 NOTE — Discharge Instructions (Signed)
ARTHROSCOPIC KNEE SURGERY HOME CARE INSTRUCTIONS   PAIN You will be expected to have a moderate amount of pain in the affected knee for approximately two weeks.  However, the first two to four days will be the most severe in terms of the pain you will experience.  Prescriptions have been provided for you to take as needed for the pain.  The pain can be markedly reduced by using the ice/compressive bandage given.  Exchange the ice packs whenever they thaw.  During the night, keep the bandage on because it will still provide some compression for the swelling.  Also, keep the leg elevated on pillows above your heart, and this will help alleviate the pain and swelling.  MEDICATION Prescriptions have been provided to take as needed for pain. To prevent blood clots, take Aspirin 325mg  daily with a meal if not on a blood thinner and if no history of stomach ulcers.  ACTIVITY It is preferred that you stay on bedrest for approximately 24 hours.  However, you may go to the bathroom with help.  After this, you can start to be up and about progressively more.  Remember that the swelling may still increase after three to four days if you are up and doing too much.  You may put as much weight on the affected leg as pain will allow.  Use your crutches for comfort and safety.  However, as soon as you are able, you may discard the crutches and go without them.   DRESSING Keep the current dressing as dry as possible.  Two days after your surgery, you may remove the ice/compressive wrap, and surgical dressing.  You may now take a shower, but do not scrub the sounds directly with soap.  Let water rinse over these and gently wipe with your hand.  Reapply band-aids over the puncture wounds and more gauze if needed.  A slight amount of thin drainage can be normal at this time, and do not let it frighten you.  Reapply the ice/compressive wrap.  You may now repeat this every day each time you shower.  SYMPTOMS TO REPORT TO  YOUR DOCTOR  -Extreme pain.  -Extreme swelling.  -Temperature above 101 degrees that does not come down with acetaminophen     (Tylenol).  -Any changes in the feeling, color or movement of your toes.  -Extreme redness, heat, swelling or drainage at your incision  EXERCISE It is preferred that you begin to exercise on the day of your surgery.  Straight leg raises and short arc quads should be begun the afternoon or evening of surgery and continued until you come back for your follow-up appointment.   Attached is an instruction sheet on how to perform these two simple exercises.  Do these at least three times per day if not more.  You may bend your knee as much as is comfortable.  The puncture wounds may occasionally be slightly uncomfortable with bending of the knee.  Do not let this frighten you.  It is important to keep your knee motion, but do not overdo it.  If you have significant pain, simply do not bend the knee as far.   You will be given more exercises to perform at your first return visit.    RETURN APPOINTMENT Please make an appointment to be seen by your doctor in       days from your surgery.  Patient Signature:  ________________________________________________________  Nurse's Signature:  ________________________________________________________   Post Anesthesia Home Care Instructions  Activity: Get plenty of rest for the remainder of the day. A responsible adult should stay with you for 24 hours following the procedure.  For the next 24 hours, DO NOT: -Drive a car -Paediatric nurse -Drink alcoholic beverages -Take any medication unless instructed by your physician -Make any legal decisions or sign important papers.  Meals: Start with liquid foods such as gelatin or soup. Progress to regular foods as tolerated. Avoid greasy, spicy, heavy foods. If nausea and/or vomiting occur, drink only clear liquids until the nausea and/or vomiting subsides. Call your physician if  vomiting continues.  Special Instructions/Symptoms: Your throat may feel dry or sore from the anesthesia or the breathing tube placed in your throat during surgery. If this causes discomfort, gargle with warm salt water. The discomfort should disappear within 24 hours.  If you had a scopolamine patch placed behind your ear for the management of post- operative nausea and/or vomiting:  1. The medication in the patch is effective for 72 hours, after which it should be removed.  Wrap patch in a tissue and discard in the trash. Wash hands thoroughly with soap and water. 2. You may remove the patch earlier than 72 hours if you experience unpleasant side effects which may include dry mouth, dizziness or visual disturbances. 3. Avoid touching the patch. Wash your hands with soap and water after contact with the patch.

## 2015-05-25 NOTE — Anesthesia Procedure Notes (Signed)
Procedure Name: LMA Insertion Date/Time: 05/25/2015 9:24 AM Performed by: Bethena Roys T Pre-anesthesia Checklist: Patient identified, Emergency Drugs available, Suction available and Patient being monitored Patient Re-evaluated:Patient Re-evaluated prior to inductionOxygen Delivery Method: Circle System Utilized Preoxygenation: Pre-oxygenation with 100% oxygen Intubation Type: IV induction Ventilation: Mask ventilation without difficulty LMA: LMA inserted LMA Size: 5.0 Number of attempts: 1 Airway Equipment and Method: Bite block Placement Confirmation: positive ETCO2 Dental Injury: Teeth and Oropharynx as per pre-operative assessment

## 2015-05-25 NOTE — Brief Op Note (Signed)
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  RIGHT KNEE degenerative MEDIAL MENISCAL tearing, 2. Grade III-IV chondromalacia MEDIAL COMPARTMENT, 3.  Grade III chondromalacia patellofemoral compartment  PROCEDURE:  Procedure(s): RIGHT KNEE ARTHROSCOPY (Right)  1. Medial partial meniscectomy 2. Medial and patellofemoral chondroplasty  SURGEON:  Surgeon(s) and Role:    * Paralee Cancel, MD - Primary  PHYSICIAN ASSISTANT: None  ANESTHESIA:   local and general  EBL:  Total I/O In: 700 [I.V.:700] Out: 0   BLOOD ADMINISTERED:none  DRAINS: none   LOCAL MEDICATIONS USED:  MARCAINE     SPECIMEN:  No Specimen  DISPOSITION OF SPECIMEN:  N/A  COUNTS:  YES  TOURNIQUET:  * No tourniquets in log *  DICTATION: .Other Dictation: Dictation Number [id number]  PLAN OF CARE: Discharge to home after PACU  PATIENT DISPOSITION:  PACU - hemodynamically stable.   Delay start of Pharmacological VTE agent (>24hrs) due to surgical blood loss or risk of bleeding: no

## 2015-05-25 NOTE — H&P (Signed)
  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  HPI- . Knee Pain: Patient presents with knee pain involving the  right knee. Onset of the symptoms was several months ago. Inciting event: She recognized increasing mechanical associated pains in her right knee after routine activity. No specific injury could be recalled. Current symptoms include foreign body sensation, giving out, pain located medially along right knee and swelling. Pain is aggravated by any weight bearing, lateral movements, pivoting and walking.  Patient has had no prior knee problems. Evaluation to date: plain films: normal other than maybe a mild narrowing of the medial joint space and MRI: abnormal with medial meniscus tear (radial type) with chondromalacia appreciated in the medial and patellofremoral compartments. Treatment to date: avoidance of offending activity, corticosteroid injection which was not very effective, OTC analgesics which are not very effective and prescription NSAIDS which are not very effective.  Past Medical History  Diagnosis Date  . History of uterine fibroid   . History of cervical dysplasia   . History of gestational diabetes   . Acute meniscal tear of left knee   . Chondromalacia of knee     LEFT COMPARTMENT    Past Surgical History  Procedure Laterality Date  . Cesarean section  X2  11-16-2005  . Colposcopy    . Gynecologic cryosurgery  age 28  . Cholecystectomy N/A 12/04/2014    Procedure: LAPAROSCOPIC CHOLECYSTECTOMY;  Surgeon: Ralene Ok, MD;  Location: [facility];  Service: General;  Laterality: N/A;  . Myomectomy abdominal approach  12-29-2003  . Negative sleep study  2015    Prior to Admission medications   Medication Sig Start Date End Date Taking? Authorizing Provider  acetaminophen (TYLENOL) 325 MG tablet Take 650 mg by mouth every 6 (six) hours as needed for mild pain or headache.   Yes Historical Provider, MD  amphetamine-dextroamphetamine  (ADDERALL XR) 30 MG 24 hr capsule Take 30 mg by mouth as needed.    Yes Historical Provider, MD  ibuprofen (ADVIL,MOTRIN) 200 MG tablet Take 200 mg by mouth every 6 (six) hours as needed for headache or mild pain.   Yes Historical Provider, MD  Melatonin 10 MG TABS Take 10 mg by mouth as needed (for sleep).   Yes Historical Provider, MD  naproxen (NAPROSYN) 500 MG tablet Take 500 mg by mouth 2 (two) times daily as needed.   Yes Historical Provider, MD  VYVANSE 30 MG capsule  02/25/15   Historical Provider, MD    soft tissue tenderness over medial joint line, effusion, reduced range of motion, collateral ligaments intact, normal ipsilateral hip exam, normal contralateral knee exam  Physical Examination: General appearance - alert, well appearing, and in no distress Mental status - alert, oriented to person, place, and time Chest - no tachypnea, retractions or cyanosis Heart - normal rate and regular rhythm Abdomen - soft, nontender, nondistended, no masses or organomegaly Musculoskeletal - see above Extremities - peripheral pulses normal, no pedal edema, no clubbing or cyanosis Skin - normal coloration and turgor, no rashes, no suspicious skin lesions noted   Asessment/Plan--- Right knee medial meniscal tear and degenerative joint changes medially and patellofemoral compartments   Plan left knee arthroscopy with medial meniscal debridement and chondroplasty. Procedure risks and potential comps discussed with patient who elects to proceed. Goals are decreased pain and increased function with a high likelihood of achieving both

## 2015-05-25 NOTE — Anesthesia Preprocedure Evaluation (Signed)
Anesthesia Evaluation  Patient identified by MRN, date of birth, ID band Patient awake    Reviewed: Allergy & Precautions, NPO status , Patient's Chart, lab work & pertinent test results  Airway Mallampati: II  TM Distance: >3 FB Neck ROM: Full    Dental   Pulmonary neg pulmonary ROS,    breath sounds clear to auscultation       Cardiovascular negative cardio ROS   Rhythm:Regular Rate:Normal     Neuro/Psych    GI/Hepatic negative GI ROS, Neg liver ROS,   Endo/Other  negative endocrine ROS  Renal/GU negative Renal ROS     Musculoskeletal   Abdominal   Peds  Hematology negative hematology ROS (+)   Anesthesia Other Findings   Reproductive/Obstetrics                             Anesthesia Physical  Anesthesia Plan  ASA: I  Anesthesia Plan: General   Post-op Pain Management:    Induction: Intravenous  Airway Management Planned: LMA  Additional Equipment:   Intra-op Plan:   Post-operative Plan:   Informed Consent: I have reviewed the patients History and Physical, chart, labs and discussed the procedure including the risks, benefits and alternatives for the proposed anesthesia with the patient or authorized representative who has indicated his/her understanding and acceptance.   Dental advisory given  Plan Discussed with: CRNA and Anesthesiologist  Anesthesia Plan Comments: (Pt is a Neurologist MD)        Anesthesia Quick Evaluation

## 2015-05-25 NOTE — Anesthesia Postprocedure Evaluation (Signed)
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) Performed: Procedure(s) (LRB): RIGHT KNEE ARTHROSCOPY (Right)  Patient location during evaluation: PACU Anesthesia Type: General Level of consciousness: awake and alert Pain management: pain level controlled Vital Signs Assessment: post-procedure vital signs reviewed and stable Respiratory status: spontaneous breathing, nonlabored ventilation, respiratory function stable and patient connected to nasal cannula oxygen Cardiovascular status: blood pressure returned to baseline and stable Postop Assessment: no signs of nausea or vomiting Anesthetic complications: no    Last Vitals:  Filed Vitals:   05/25/15 1100 05/25/15 1210  BP: 156/71 166/78  Pulse: 75 59  Temp:  36.7 C  Resp: 13 14    Last Pain:  Filed Vitals:   05/25/15 1224  PainSc: 4                  [name]

## 2015-05-26 ENCOUNTER — Encounter (HOSPITAL_BASED_OUTPATIENT_CLINIC_OR_DEPARTMENT_OTHER): Payer: Self-pay | Admitting: Orthopedic Surgery

## 2015-05-26 NOTE — Op Note (Signed)
NAME:  Martha Vincent, Martha Vincent             ACCOUNT NO.:  [id number]  MEDICAL RECORD NO.:  [id number]  LOCATION:                                 FACILITY:  PHYSICIAN:  Pietro Cassis. Alvan Dame, M.D.  DATE OF BIRTH:  January 09, 1966  DATE OF PROCEDURE:  05/25/2015 DATE OF DISCHARGE:                              OPERATIVE REPORT   PREOPERATIVE DIAGNOSIS:  Right knee medial meniscal tear associated with degenerative joint changes.  POSTOPERATIVE DIAGNOSES/FINDINGS: 1. Degenerative complex tearing to the medial meniscus involving the     posterior horn and around to the midbody-anterior horn junction. 2. Grade 3-4 chondromalacia of the medial compartment of the knee with     exposed bone on the medial aspect of the medial tibial plateau and     medial femoral condyle. 3. Grade 2-3 chondromalacia noted of the patellofemoral compartment,     both on the patellar apex as well as on the trochlear surface.  PROCEDURES: 1. Right knee diagnostic and operative arthroscopy with medial partial     meniscectomy. 2. Patellofemoral chondroplasty, debridement of chondral flaps. 3. Abrasion chondroplasty on the right tibial plateau surface and not     on the femoral surface with debridement of chondral flaps.  SURGEON:  Pietro Cassis. Alvan Dame, M.D.  ASSISTANT:  Surgical team.  ANESTHESIA:  General plus a local portal site block and a postoperative Marcaine block.  COMPLICATIONS:  None.  DRAINS:  None.  BLOOD LOSS:  None.  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  She had medial-based mechanical symptoms that were worse with certain activities.  Despite conservative measures to include an attempted cortisone injection, medications and activity modification, she was having recurrent effusions and catching sensation in the knee.  MRI was subsequently ordered revealing meniscal pathology as well as degenerative changes of the  medial and patellofemoral.  Based on the persistence of her mechanical symptoms and the influence on her overall quality of life and function, she wishes at this point to proceed with more of a surgical intervention.  We discussed risks, benefits, limitations of arthroscopic surgery, risks of infection and DVT reviewed, limitations of management, degenerative joint changes, potential progressive arthritis and recurrent meniscal pathology reviewed.  Consent was obtained for benefit of pain relief.  PROCEDURE IN DETAIL:  The patient was brought to the operative theater. Once adequate anesthesia, preoperative antibiotics, Ancef administered, she was positioned supine with the right leg and leg holder.  The right lower extremity was then prepped and draped in sterile fashion.  Time- out was performed identifying the patient, planned procedure, and extremity.  Standard inferomedial, superomedial and inferolateral portals were utilized.  Initially, the diagnostic evaluation of this was carried out through the inferolateral portal with the scope.  We identified the grade 2-3 chondromalacia in the patellofemoral and trochlear regions of the knee.  Lateral gutter was clear of any debris.  The lateral compartment was completely normal with intact meniscus and chondral surfaces.  Her ACL was intact.  The medial compartment was addressed through the inferior medial portal. Following probe, examination of the chondral surfaces and meniscus, I initially used the straight-biting  basket to work on the posterior horn- midbody junction and then debrided this back with a 3.5 Cuda shaver. Based on the involvement of the midbody and anterior horn region, I did switch portals placing the scope into the inferomedial portal and allow me to better address the lateral and anterior aspect of the medial meniscus.  I used a 3.5 Cuda shaver and the straight-biting basket again through this lateral portal in  order to better debride and stabilize.  Following the meniscal procedure, I used a 3.5 Cuda shaver in the medial compartment of the joint, debriding chondral flaps, but also performed an abrasion chondroplasty on the tibial plateau back to stimulated bone to try to create some fiber cartilage overgrowth of this area.  Once this was completed, we addressed anteriorly in the patellofemoral compartment through the inferomedial portal utilizing a 3.5 Cuda shaver. I removed the chondral flaps in the patella apex and trochlear region.  In addition, a significant synovectomy was carried out with plica excision medially to try to eliminate further symptoms along the border of the medial femoral condyle.  Once I completed the above-stated procedures, I re-examined the knee to make certain that there were no loose meniscal fragments.  The procedure was provided stable cartilage surfaces, both articular and meniscal.  Once this was done, the instrumentation was removed from the knee, portal sites were reapproximated using 4-0 nylon.  The knee was injected at the end of the case with 0.25% Marcaine with epinephrine and 1 mL of Toradol.  The knee was then cleaned, dried and dressed sterilely using bulky sterile wrap. She was brought to the recovery room in stable condition tolerating the procedure well.  We will review the findings once she was in the office. After her first postop visit, she may wish to proceed with physical therapy.  I have discussed with her further efforts at weight loss and strengthening of her lower extremities particularly given these intraoperative findings, this will be stress for long-term management of her knee condition.     Pietro Cassis Alvan Dame, M.D.     MDO/MEDQ  D:  05/25/2015  T:  05/25/2015  Job:  [id number]

## 2015-06-12 ENCOUNTER — Ambulatory Visit: Payer: 59 | Attending: Orthopedic Surgery | Admitting: Rehabilitative and Restorative Service Providers"

## 2015-06-12 DIAGNOSIS — M6281 Muscle weakness (generalized): Secondary | ICD-10-CM | POA: Diagnosis not present

## 2015-06-12 DIAGNOSIS — M25561 Pain in right knee: Secondary | ICD-10-CM | POA: Diagnosis not present

## 2015-06-12 NOTE — Patient Instructions (Signed)
Quad Set    With other leg bent, foot flat, slowly tighten muscles on thigh of straight leg while counting out loud to __5 seconds__.  Repeat _10-15___ times. Do __2__ sessions per day.  http://gt2.exer.us/276   Copyright  VHI. All rights reserved.  Hip Flexion / Knee Extension: Straight-Leg Raise (Eccentric)    Lie on back. (contract you quadricep muscle before lifting) Lift leg with knee straight and hold for 5 seconds. Slowly lower leg for 3-5 seconds. _10-15_ reps per set, _2__ sets per day, _7__ days per week.   Copyright  VHI. All rights reserved.   Long [employer]    Straighten operated leg and try to hold it _5___ seconds. No weights. Repeat __10-15__ times. Do _2___ sessions a day.  http://gt2.exer.us/311   Copyright  VHI. All rights reserved.   Hamstring Stretch, Seated (Strap, Two Chairs)    Sit with one leg extended onto facing chair. No loop. Lengthen spine. Hold for __20 seconds.Repeat _2 times/day.  Copyright  VHI. All rights reserved.

## 2015-06-12 NOTE — Therapy (Signed)
[facility] [address] 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 History of uterine fibroid   . History of cervical dysplasia   . History of gestational diabetes   . Acute meniscal tear of left knee   . Chondromalacia of knee     LEFT COMPARTMENT    Past Surgical History  Procedure Laterality Date  . Cesarean section  X2  11-16-2005  . Colposcopy    . Gynecologic cryosurgery  age 50  . Cholecystectomy N/A 12/04/2014    Procedure: LAPAROSCOPIC CHOLECYSTECTOMY;  Surgeon: Ralene Ok, MD;  Location: [facility];  Service: General;  Laterality: N/A;  . Myomectomy abdominal approach  12-29-2003  . Negative sleep study  2015  . Knee arthroscopy Right 05/25/2015    Procedure: RIGHT KNEE ARTHROSCOPY;  Surgeon: Paralee Cancel, MD;  Location: [facility];  Service: Orthopedics;  Laterality: Right;    There were no vitals filed for this visit.  Visit Diagnosis:  Pain in right knee  Generalized muscle weakness      Subjective Assessment - 06/12/15 1111    Subjective The patient is s/p R knee arthroscopic surgery 05/25/2015.  The patient had an injury 2.5 years ago at a pool (had a valgus knee injury with joint effusions +meniscus tear +ACL ligamentous injury).  Patient had a "clicking" sensation after the injury.     At this time, the patient is having pain at night when fatigued with stinging sensation in the knee.     Pertinent History  Steroid injection 04/2015 and then had a knee effusion in mid February s/p a fall.   Limitations Standing   Patient Stated Goals Return to neightborhood walking and hiking-increase activity.   Currently in Pain? Yes   Pain Score 2    Pain Location Knee   Pain Orientation Right   Pain Descriptors / Indicators Burning   Pain Type Chronic pain   Pain Onset More than a month ago   Pain Frequency Intermittent   Aggravating Factors  when fatigued, terminal knee extension   Pain Relieving Factors rest, cabbage leaves (for cool/decreasing inflammation)            [facility] PT Assessment - 06/12/15 1552    Assessment   Medical Diagnosis R knee arthroscopy   Referring Provider Marissa Nestle, MD   Onset Date/Surgical Date 05/25/15   Prior Therapy n/a   Precautions   Precautions None   Restrictions   Weight Bearing Restrictions No   [place] residence   Living Arrangements Spouse/significant other;Children   Type of [place]   Prior Function   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   AROM Assessment Site Knee   Right/Left Knee Right   Right Knee Extension --  6 degree extension lag during long arc quad   Right Knee Flexion --  WFLs   Strength   Overall Strength Deficits   Overall Strength Comments pain with resisted extension.  L LE 5/5, R hip abduction 5/5.    Strength Assessment  Site Knee   Right/Left Knee Right   Right Knee Flexion 4/5   Right Knee Extension 3+/5   Flexibility   Soft Tissue Assessment /Muscle Length yes   Hamstrings tightness noted during hamstring stretching with knee maintained in flexion at initial heel strike phase of gait   Palpation   Patella mobility limited with pain noted   Palpation comment painful to palpation inferior/medial to incision with edema noted.   Ambulation/Gait   Ambulation/Gait Yes   Ambulation/Gait Assistance 7: Independent   Ambulation Distance (Feet) 150 Feet   Assistive device None   Gait Pattern Decreased stance time - right;Decreased step length - right;Decreased weight shift to right;Right flexed knee in stance;Lateral trunk lean to right   Ambulation Surface Level   Gait Comments Patient is lackig terminal knee extension at initial stance phase of gait and maintains knee fleixon during loading through mid stance.  Hamstring shortening, as well as apprehension due to "clicking" sensation limit this motion.              PT Education - 06/12/15 1710    Education provided Yes   Education Details HEP: quad set, SLR, LAQ, hamstring stretch   Person(s) Educated Patient   Methods Explanation;Demonstration;Handout   Comprehension Verbalized understanding;Returned demonstration          PT Short Term Goals - 06/12/15 1712    PT SHORT TERM GOAL #1   Title The patient will be independent with HEP for LE strengthening, stretching, and quad re-education.   Baseline Target date 08/11/2015   Time 4   Period Weeks   PT SHORT TERM GOAL #2   Title The patient will imrpove long arc quad to demonstrate 3 degrees or less of extension lag.   Baseline Target date 08/11/2015   Time 4   Period Weeks   PT SHORT TERM GOAL #3   Title The patient will reduce pain to 0/10 at rest in right knee.   Baseline Target date 08/11/2015   Time 4   Period Weeks   PT SHORT TERM GOAL #4   Title The patient will initiate community  exercise program with guidance for progression of LE strengthening.   Baseline Target date 08/11/2015   Time 4   Period Weeks           PT Long Term Goals - 06/12/15 1714    PT LONG TERM GOAL #1   Title The patient will return to community walking without knee pain x 1 mile per subjective reports.   Baseline Target date 08/10/2015   Time 8   Period Weeks   PT LONG TERM GOAL #2   Title The patient will maintain single limb stance R LE x 8 seconds to demo improved tolerance to weight  bearing through R LE.   Baseline Target date 08/10/2015   Time 8   Period Weeks   PT LONG TERM GOAL #3   Title The patient will demonstrate improved R heel strike during gait activities at initial stance phase for improved gait efficiency.   Baseline Target date 08/10/2015   Time 8   Period Weeks   PT LONG TERM GOAL #4   Title The patient will improve R LE quad strength to 5/5 for improved functional knee control.   Baseline Target date 08/10/2015   Time 8   Period Weeks   PT LONG TERM GOAL #5   Title The patient will demo independence with HEP progression.   Baseline Target date 08/10/2015   Time 8   Period Weeks               Plan - 06/12/15 1716    Clinical Impression Statement The patient is a 50 year old female with h/o R knee injury and recent surgery 05/25/2015.  She presents to therapy with edema, tenderness to palpation medially, decreased patellar tracking, weakness in quads +hamstrings, tightness in hamstrings and declined activity level.  PT to address deficits to optimize return to walking, and general work/recreational tasks without knee pain.   Pt will benefit from skilled therapeutic intervention in order to improve on the following deficits Abnormal gait;Decreased range of motion;Difficulty walking;Pain;Decreased activity tolerance;Decreased strength;Increased edema;Impaired flexibility   Rehab Potential Good   PT Frequency 2x / week   PT Duration 4 weeks  followed by 1x/week 4  weeks   PT Treatment/Interventions Cryotherapy;Electrical Stimulation;Moist Heat;Therapeutic exercise;Therapeutic activities;Functional mobility training;Gait training;Stair training;Neuromuscular re-education;Patient/family education;Manual techniques;Passive range of motion   PT Next Visit Plan Check initial HEP, prone knee flexion/extension, quad control during terminal extension, calf stretches, walking mechanics, single limb stance activities.  Modalities prn   Consulted and Agree with Plan of Care Patient         Problem List Patient Active Problem List   Diagnosis Date Noted  . Effusion of right knee 05/21/2015  . 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 Acute cholecystitis 12/04/2014  . Acute acalculous cholecystitis 12/04/2014  . Fibroid     Markan Cazarez, PT 06/12/2015, 5:19 PM  [facility] [address] [place], [place], [address] Phone: [phone]   Fax:  [phone]  Name: Luciana Illes, Dr. MRN: [id number] Date of Birth: May 01, 1965

## 2015-06-15 ENCOUNTER — Ambulatory Visit: Payer: 59 | Admitting: Rehabilitative and Restorative Service Providers"

## 2015-06-15 DIAGNOSIS — M25561 Pain in right knee: Secondary | ICD-10-CM

## 2015-06-15 DIAGNOSIS — M6281 Muscle weakness (generalized): Secondary | ICD-10-CM | POA: Diagnosis not present

## 2015-06-15 NOTE — Therapy (Signed)
[facility] [address] 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 History of uterine fibroid   . History of cervical dysplasia   . History of gestational diabetes   . Acute meniscal tear of left knee   . Chondromalacia of knee     LEFT COMPARTMENT    Past Surgical History  Procedure Laterality Date  . Cesarean section  X2  11-16-2005  . Colposcopy    . Gynecologic cryosurgery  age 52  . Cholecystectomy N/A 12/04/2014    Procedure: LAPAROSCOPIC CHOLECYSTECTOMY;  Surgeon: Ralene Ok, MD;  Location: [facility];  Service: General;  Laterality: N/A;  . Myomectomy abdominal approach  12-29-2003  . Negative sleep study  2015  . Knee arthroscopy Right 05/25/2015    Procedure: RIGHT KNEE ARTHROSCOPY;  Surgeon: Paralee Cancel, MD;  Location: [facility];  Service: Orthopedics;  Laterality: Right;    There were no vitals filed for this visit.  Visit Diagnosis:  Generalized muscle weakness  Pain in right knee      Subjective Assessment - 06/15/15 1346    Subjective The patient reports doing HEP over the weekend.  She is moving better and feels that she is not having to favor her knee as much during gait.  The ice also seemed to help with swelling around R knee.   Patient Stated Goals Return to neightborhood  walking and hiking-increase activity.   Currently in Pain? --  none reported              [facility] Adult PT Treatment/Exercise - 06/15/15 1348    Ambulation/Gait   Ambulation/Gait Yes   Ambulation/Gait Assistance 7: Independent   Ambulation Distance (Feet) 500 Feet   Gait Comments Patient is hitting R heel strike today at initial stance phase and demonstrating improved knee extension during loading onto R leg from initial contact to mid stance.     Exercises   Exercises Knee/Hip   Knee/Hip Exercises: Stretches   Gastroc Stretch Right;Left;3 reps;20 seconds  with cues to sustain stretch x 20 seconds   Knee/Hip Exercises: Standing   Hip Abduction AROM;Right;Left;10 reps  with cues to slow movement and reduce ROM for control   Knee/Hip Exercises: Supine   Quad Sets 5 reps;AROM;Right   Terminal Knee Extension AROM;5 reps   Straight Leg Raise with External Rotation AROM;Strengthening;Right;10 reps   Knee Extension AROM;10 reps   Knee/Hip Exercises: Sidelying   Hip ABduction AROM;10 reps;Right   Knee/Hip Exercises: Prone   Hamstring Curl 2 sets;10 reps  added 2 lb weight to 2nd set with cues                PT Education - 06/15/15 1355    Education provided Yes   Education Details HEP: VMO SLR with ER, prone  knee flexion/extension, hip abduction standing, heel cord stretch   Person(s) Educated Patient   Methods Explanation;Demonstration;Handout   Comprehension Verbalized understanding;Returned demonstration          PT Short Term Goals - 06/12/15 1712    PT SHORT TERM GOAL #1   Title The patient will be independent with HEP for LE strengthening, stretching, and quad re-education.   Baseline Target date 08/11/2015   Time 4   Period Weeks   PT SHORT TERM GOAL #2   Title The patient will imrpove long arc quad to demonstrate 3 degrees or less of extension lag.   Baseline Target date 08/11/2015   Time 4   Period Weeks   PT SHORT TERM GOAL #3   Title The patient  will reduce pain to 0/10 at rest in right knee.   Baseline Target date 08/11/2015   Time 4   Period Weeks   PT SHORT TERM GOAL #4   Title The patient will initiate community exercise program with guidance for progression of LE strengthening.   Baseline Target date 08/11/2015   Time 4   Period Weeks           PT Long Term Goals - 06/12/15 1714    PT LONG TERM GOAL #1   Title The patient will return to community walking without knee pain x 1 mile per subjective reports.   Baseline Target date 08/10/2015   Time 8   Period Weeks   PT LONG TERM GOAL #2   Title The patient will maintain single limb stance R LE x 8 seconds to demo improved tolerance to weight bearing through R LE.   Baseline Target date 08/10/2015   Time 8   Period Weeks   PT LONG TERM GOAL #3   Title The patient will demonstrate improved R heel strike during gait activities at initial stance phase for improved gait efficiency.   Baseline Target date 08/10/2015   Time 8   Period Weeks   PT LONG TERM GOAL #4   Title The patient will improve R LE quad strength to 5/5 for improved functional knee control.   Baseline Target date 08/10/2015   Time 8   Period Weeks   PT LONG TERM GOAL #5   Title The patient will demo independence with HEP progression.   Baseline Target date 08/10/2015   Time 8   Period Weeks               Plan - 06/15/15 1356    Clinical Impression Statement The patient is responding well to initial HEP.  PT progressing to patient tolerance.  Her gait pattern is improved since eval 3 days ago with more awareness of knee position.   PT Next Visit Plan Check HEP, quad control, right leg stance activities, calf/hamstring stretches, modalities prn.   Consulted and Agree with Plan of Care Patient        Problem List Patient Active Problem List   Diagnosis Date Noted  . Effusion of right knee 05/21/2015  . 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 Acute cholecystitis 12/04/2014  .  Acute acalculous cholecystitis 12/04/2014  . Fibroid     Jmya Uliano, PT 06/15/2015, 1:57 PM  [facility] [address] 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

## 2015-06-15 NOTE — Patient Instructions (Signed)
Straight Leg Raise: With External Leg Rotation    Lie on back with right leg straight, opposite leg bent. Rotate straight leg out and lift _8___ inches, hold 3-5 seconds and then lower slowly. Repeat __10__ times per set. Do __2__ sets per session. Do _2___ sessions per day.  http://orth.exer.us/729   Copyright  VHI. All rights reserved.  KNEE: Flexion - Prone    Bend knee. Raise heel toward buttocks. Do not raise hips. _10-15__ reps per set, _2__ sets per day, _2__ days per week   Copyright  VHI. All rights reserved.  ABDUCTION: Standing (Active)    Stand, feet flat. Lift right leg out to side. NO WEIGHT TO BEGIN.  Perform on both legs. Complete _1__ sets of _10-15__ repetitions. Perform _2__ sessions per day.  http://gtsc.exer.us/111   Copyright  VHI. All rights reserved.  Heel Cord Stretch    Place one leg forward, bent, other leg behind and straight. Lean forward keeping back heel flat. Hold __20-30__ seconds while counting out loud. Repeat with other leg. Repeat _3___ times. Do _2___ sessions per day.  http://gt2.exer.us/512   Copyright  VHI. All rights reserved.    Walking Program:  Begin walking on level, outdoor surfaces (paved, no hills to start) for exercise for 5 minutes, 1 times/day, 5 days/week.    Progress your walking program by adding 3-4 minutes to your routine each week, as tolerated. Be sure to wear good walking shoes, walk in a safe environment and only progress to your tolerance.

## 2015-06-18 ENCOUNTER — Ambulatory Visit: Payer: 59 | Admitting: Rehabilitative and Restorative Service Providers"

## 2015-06-19 ENCOUNTER — Ambulatory Visit: Payer: 59 | Admitting: Physical Therapy

## 2015-06-19 DIAGNOSIS — M25561 Pain in right knee: Secondary | ICD-10-CM | POA: Diagnosis not present

## 2015-06-19 DIAGNOSIS — M6281 Muscle weakness (generalized): Secondary | ICD-10-CM | POA: Diagnosis not present

## 2015-06-20 NOTE — Therapy (Signed)
[facility] 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 History of uterine fibroid   . History of cervical dysplasia   . History of gestational diabetes   . Acute meniscal tear of left knee   . Chondromalacia of knee     LEFT COMPARTMENT    Past Surgical History  Procedure Laterality Date  . Cesarean section  X2  11-16-2005  . Colposcopy    . Gynecologic cryosurgery  age 69  . Cholecystectomy N/A 12/04/2014    Procedure: LAPAROSCOPIC CHOLECYSTECTOMY;  Surgeon: Ralene Ok, MD;  Location: [facility];  Service: General;  Laterality: N/A;  . Myomectomy abdominal approach  12-29-2003  . Negative sleep study  2015  . Knee arthroscopy Right 05/25/2015    Procedure: RIGHT KNEE ARTHROSCOPY;  Surgeon: Paralee Cancel, MD;  Location: [facility];  Service: Orthopedics;  Laterality: Right;    There were no vitals filed for this visit.  Visit Diagnosis:  Generalized muscle weakness  Pain in right knee      Subjective Assessment - 06/20/15 1952    Subjective Pt reports R knee is "already better". Has been able to apply ice at least twice per day since last session, and feels as though this is helping. Pt continues to experience "clicking" during terminal extension of knee.   Pertinent History  Steroid  injection 04/2015 and then had a knee effusion in mid February s/p a fall.   Limitations Standing   Patient Stated Goals Return to neightborhood walking and hiking-increase activity.   Currently in Pain? No/denies                         [facility] Adult PT Treatment/Exercise - 06/20/15 0001    Self-Care   Self-Care Heat/Ice Application   Heat/Ice Application The following education was provided during ice and compression on R knee (pt supine with RLE elevated) x6 minutes: role of VMO in patellar tracking; explained use of NMES at VMO during functional movement to improve VMO activation and motor control, balance muscle activation and R knee, and improve patellofemoral alignment.  Pt verbalized understanding.   Neuro Re-ed    Neuro Re-ed Details  Performed the following with NMES at R VMO for improved motor control during R terminal knee extension: supine R quad set (with NMES) 5 reps x5-sec holds; R SAQ x5 reps; supine SLR 2 x15 reps with no noted extension lag.   Exercises   Exercises Knee/Hip   Knee/Hip Exercises: Stretches   Gastroc Stretch Right;Left;20 seconds;Other (comment)  Cueing for technique   Knee/Hip Exercises: Standing   Hip Abduction AROM;Stengthening;Right;1 set;10 reps  Cueing to decrease compensation with R TFL   Hip Extension AROM;Stengthening;Right;10 reps  prone   Knee/Hip Exercises: Supine  Quad Sets AROM;Strengthening;Right;1 set;5 reps   Terminal Knee Extension AROM;Strengthening;Right;1 set;5 reps   Straight Leg Raises AROM;Strengthening;Right;2 sets;10 reps;15 reps  x10 reps w/o NMES w/ mild ext. lag; x15 reps w/ NMES at VMO   Straight Leg Raise with External Rotation AROM;Strengthening;10 reps                  PT Short Term Goals - 06/12/15 1712    PT SHORT TERM GOAL #1   Title The patient will be independent with HEP for LE strengthening, stretching, and quad re-education.   Baseline Target date 08/11/2015   Time 4   Period Weeks    PT SHORT TERM GOAL #2   Title The patient will imrpove long arc quad to demonstrate 3 degrees or less of extension lag.   Baseline Target date 08/11/2015   Time 4   Period Weeks   PT SHORT TERM GOAL #3   Title The patient will reduce pain to 0/10 at rest in right knee.   Baseline Target date 08/11/2015   Time 4   Period Weeks   PT SHORT TERM GOAL #4   Title The patient will initiate community exercise program with guidance for progression of LE strengthening.   Baseline Target date 08/11/2015   Time 4   Period Weeks           PT Long Term Goals - 06/12/15 1714    PT LONG TERM GOAL #1   Title The patient will return to community walking without knee pain x 1 mile per subjective reports.   Baseline Target date 08/10/2015   Time 8   Period Weeks   PT LONG TERM GOAL #2   Title The patient will maintain single limb stance R LE x 8 seconds to demo improved tolerance to weight bearing through R LE.   Baseline Target date 08/10/2015   Time 8   Period Weeks   PT LONG TERM GOAL #3   Title The patient will demonstrate improved R heel strike during gait activities at initial stance phase for improved gait efficiency.   Baseline Target date 08/10/2015   Time 8   Period Weeks   PT LONG TERM GOAL #4   Title The patient will improve R LE quad strength to 5/5 for improved functional knee control.   Baseline Target date 08/10/2015   Time 8   Period Weeks   PT LONG TERM GOAL #5   Title The patient will demo independence with HEP progression.   Baseline Target date 08/10/2015   Time 8   Period Weeks               Plan - 06/20/15 2056    Clinical Impression Statement Session focused in improving R terminal knee extension and improving muscle balance at R knee to improve alignment and control ST swelling/pain. Pt reporting no pain and perceives improvement in R knee "clicking" during mobility.    Pt will benefit from skilled therapeutic intervention in order to improve on the  following deficits Abnormal gait;Decreased range of motion;Difficulty walking;Pain;Decreased activity tolerance;Decreased strength;Increased edema;Impaired flexibility   Rehab Potential Good   PT Frequency 2x / week   PT Duration 4 weeks  followed by 1x/week for 4 weeks   PT Treatment/Interventions Cryotherapy;Electrical Stimulation;Moist Heat;Therapeutic exercise;Therapeutic activities;Functional mobility training;Gait training;Stair training;Neuromuscular re-education;Patient/family education;Manual techniques;Passive range of motion   PT Next Visit Plan Check HEP, quad control, right leg stance activities, calf/hamstring stretches, modalities prn.    PT Home Exercise  Plan Christina, I accidentally cued the pt to perform R glute strengthening rather than hamstring curls on HEP (sorry about that). Please clarify this with patient.    Consulted and Agree with Plan of Care Patient        Problem List Patient Active Problem List   Diagnosis Date Noted  . Effusion of right knee 05/21/2015  . 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 Acute cholecystitis 12/04/2014  . Acute acalculous cholecystitis 12/04/2014  . Fibroid     Billie Ruddy, PT, DPT [facility] [address] [place], [place], [address] Phone: [phone]   Fax:  [phone] 06/20/2015, 9:01 PM  Name: Moonyeen Langell, Dr. MRN: [id number] Date of Birth: 23-Mar-1965

## 2015-06-24 ENCOUNTER — Ambulatory Visit: Payer: 59 | Attending: Orthopedic Surgery | Admitting: Rehabilitative and Restorative Service Providers"

## 2015-06-24 DIAGNOSIS — M6281 Muscle weakness (generalized): Secondary | ICD-10-CM | POA: Insufficient documentation

## 2015-06-24 DIAGNOSIS — M25561 Pain in right knee: Secondary | ICD-10-CM | POA: Diagnosis not present

## 2015-06-24 NOTE — Therapy (Signed)
[facility] [address] 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 History of uterine fibroid   . History of cervical dysplasia   . History of gestational diabetes   . Acute meniscal tear of left knee   . Chondromalacia of knee     LEFT COMPARTMENT    Past Surgical History  Procedure Laterality Date  . Cesarean section  X2  11-16-2005  . Colposcopy    . Gynecologic cryosurgery  age 25  . Cholecystectomy N/A 12/04/2014    Procedure: LAPAROSCOPIC CHOLECYSTECTOMY;  Surgeon: Ralene Ok, MD;  Location: [facility];  Service: General;  Laterality: N/A;  . Myomectomy abdominal approach  12-29-2003  . Negative sleep study  2015  . Knee arthroscopy Right 05/25/2015    Procedure: RIGHT KNEE ARTHROSCOPY;  Surgeon: Paralee Cancel, MD;  Location: [facility];  Service: Orthopedics;  Laterality: Right;    There were no vitals filed for this visit.  Visit Diagnosis:  Generalized muscle weakness  Pain in right knee      Subjective Assessment - 06/24/15 1402    Subjective The patient is no longer using a stool to get around in the office at work.  She is walking more during the day.  She does not increased swelling after being up on her feet for hours.  She still notes some clicking sensation at full extension.   Pertinent History  Steroid injection 04/2015 and then had a knee effusion in mid February s/p a fall.   Limitations Standing   Patient Stated Goals Return to neightborhood walking and hiking-increase activity.   Currently in Pain? No/denies            [facility] Adult PT Treatment/Exercise - 06/24/15 1408    Ambulation/Gait   Ambulation/Gait Yes   Ambulation/Gait Assistance 7: Independent   Ambulation Distance (Feet) 500 Feet   Assistive device None   Ambulation Surface Level   Gait Comments Patient is able to elicit quad contraction during gait, which improves her ability to hit R heel strike.  During walk into clinic today, she has antalgic pattern, but with cues, she can demonstrate more normalized pattern.     Exercises   Exercises Knee/Hip   Knee/Hip Exercises: Standing   Hip Flexion AROM;Stengthening;Right;Left;10 reps   Hip ADduction AROM;Strengthening;Right;Left;10 reps  red theraband   Hip Abduction AROM;Stengthening;Right;Left;10 reps  red theraband standing SLR   Hip Extension AROM;Stengthening;Right;Left;10 reps  red theraband, standing SLR   Knee/Hip Exercises: Seated   Long Arc Quad AROM;Strengthening;Right;10 reps   Long Arc Quad Limitations Used e-stim for VMO contraction strength and re-ed   Knee/Hip Exercises: Supine   Quad Sets AROM;Right;Strengthening;5 reps  with e-stim for quad re-education   Straight Leg Raises AROM;Strengthening;Right;2 sets;10 reps;15 reps  x10 reps w/o NMES w/  mild ext. lag; x15 reps w/ NMES at VMO   Knee Extension AROM;10 reps   Modalities   Modalities Electrical Stimulation   Electrical Stimulation   Electrical Stimulation Location R VMO   Electrical Stimulation Action quad control for terminal knee extension   Electrical Stimulation Parameters 15 seconds on/30 seconds off; intensity of 10 x 10 minutes   Electrical Stimulation Goals Neuromuscular facilitation   Manual Therapy   Manual Therapy Other (comment)   Manual therapy  comments Patellar mobilizations   Other Manual Therapy superior direction grade II-III                PT Education - 06/24/15 1404    Education provided Yes   Education Details HEP: modified HEP: SLR with red theraband in all directions, heel cord stretch, prone hamstring strength, hamstring stretch   Person(s) Educated Patient   Methods Explanation;Demonstration;Handout   Comprehension Verbalized understanding;Returned demonstration          PT Short Term Goals - 06/24/15 1405    PT SHORT TERM GOAL #1   Title The patient will be independent with HEP for LE strengthening, stretching, and quad re-education.   Baseline Target date 07/12/2015   Time 4   Period Weeks   PT SHORT TERM GOAL #2   Title The patient will imrpove long arc quad to demonstrate 3 degrees or less of extension lag.   Baseline Target date 07/12/2015   Time 4   Period Weeks   PT SHORT TERM GOAL #3   Title The patient will reduce pain to 0/10 at rest in right knee.   Baseline Target date 07/12/2015   Time 4   Period Weeks   PT SHORT TERM GOAL #4   Title The patient will initiate community exercise program with guidance for progression of LE strengthening.   Baseline Target date 07/12/2015   Time 4   Period Weeks           PT Long Term Goals - 06/24/15 1405    PT LONG TERM GOAL #1   Title The patient will return to community walking without knee pain x 1 mile per subjective reports.   Baseline Target date 08/10/2015   Time 8   Period Weeks   PT LONG TERM GOAL #2   Title The patient will maintain single limb stance R LE x 8 seconds to demo improved tolerance to weight bearing through R LE.   Baseline Target date 08/10/2015   Time 8   Period Weeks   PT LONG TERM GOAL #3   Title The patient will demonstrate improved R heel strike during gait activities at initial stance phase for improved gait efficiency.   Baseline Target date 08/10/2015   Time 8   Period Weeks   PT LONG TERM GOAL #4   Title  The patient will improve R LE quad strength to 5/5 for improved functional knee control.   Baseline Target date 08/10/2015   Time 8   Period Weeks   PT LONG TERM GOAL #5   Title The patient will demo independence with HEP progression.   Baseline Target date 08/10/2015   Time 8   Period Weeks               Plan - 06/24/15 1406    Clinical Impression Statement The patient is making great progress towards STGs with improved full knee extension (per observation) and hitting R heel strike during gait.  Patient is able to activate quads during  long arc quads and gait activities.  Transitioned to standing straight leg raise activities with red theraband for resistance.    Pt will benefit from skilled therapeutic intervention in order to improve on the following deficits Abnormal gait;Decreased range of motion;Difficulty walking;Pain;Decreased activity tolerance;Decreased strength;Increased edema;Impaired flexibility   Rehab Potential Good   PT Next Visit Plan Check HEP, gait activities, hamstring strengthening (stool knee flexion), stretching   Consulted and Agree with Plan of Care Patient        Problem List Patient Active Problem List   Diagnosis Date Noted  . Effusion of right knee 05/21/2015  . 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 Acute cholecystitis 12/04/2014  . Acute acalculous cholecystitis 12/04/2014  . 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

## 2015-06-24 NOTE — Patient Instructions (Addendum)
Hip Flexors / Quadriceps    Stand on floor and place red theraband on right ankle.  MAKE A TIGHT QUAD BEFORE KICKING FORWARD. Pull leg forward with straight knee. Keep head and back straight. Do not allow pelvis to tilt or rotate. Hold __3__ seconds. Repeat _5___ times (WORK UP TO 10 TIMES AS ABLE). Do _1-2___ sessions per day. CAUTION: Move slowly. May lightly hold chair for stability.  Copyright  VHI. All rights reserved.  ABDUCTION: Standing - Resistance Band (Active)    Stand, feet flat. Against RED resistance band, lift right leg out to side. Complete _1__ sets of _5-10__ repetitions. Perform _1-2__ sessions per day.  http://gtsc.exer.us/117   Copyright  VHI. All rights reserved.  ADDUCTION: Standing - Stable: Resistance Band (Active)    Stand, right leg out to side as far as possible. Against red resistance band, draw leg in across midline. Complete _1__ sets of _5-10__ repetitions. Perform _1-2__ sessions per day.  http://gtsc.exer.us/151   Copyright  VHI. All rights reserved.  HIP / KNEE: Extension - Standing (Band)    Place band around leg. Squeeze glutes. Raise and lift leg backward. Keep knee straight. Hold __3_ seconds. Use __red______ band. _5-10__ reps per set, __1-2_ sets per day. Hold onto a support.  Copyright  VHI. All rights reserved.  Hamstring Stretch, Seated (Strap, Two Chairs)    Sit with one leg extended onto facing chair. No loop. Lengthen spine. Hold for __20 seconds.Repeat _2 times/day.  Copyright  VHI. All rights reserved.   KNEE: Flexion - Prone    Bend knee. Raise heel toward buttocks. Do not raise hips. _10-15__ reps per set, _2__ sets per day, _2__ days per week   Copyright  VHI. All rights reserved.  Heel Cord Stretch    Place one leg forward, bent, other leg behind and straight. Lean forward keeping back heel flat. Hold __20-30__ seconds while counting out loud. Repeat with other leg. Repeat _3___ times. Do _2___  sessions per day.  http://gt2.exer.us/512   Copyright  VHI. All rights reserved.    Walking Program:  Begin walking on level, outdoor surfaces (paved, no hills to start) for exercise for 5 minutes, 1 times/day, 5 days/week.   Progress your walking program by adding 3-4 minutes to your routine each week, as tolerated. Be sure to wear good walking shoes, walk in a safe environment and only progress to your tolerance.     Martha Vincent, [facility] [address] [place], [place], [address] Phone: [phone] Fax: [phone]

## 2015-06-26 ENCOUNTER — Ambulatory Visit: Payer: 59 | Admitting: Rehabilitative and Restorative Service Providers"

## 2015-06-26 DIAGNOSIS — M6281 Muscle weakness (generalized): Secondary | ICD-10-CM

## 2015-06-26 DIAGNOSIS — M25561 Pain in right knee: Secondary | ICD-10-CM

## 2015-06-26 NOTE — Therapy (Signed)
[facility] [address] 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 for tasks assessed/performed      Past Medical History  Diagnosis Date  . History of uterine fibroid   . History of cervical dysplasia   . History of gestational diabetes   . Acute meniscal tear of left knee   . Chondromalacia of knee     LEFT COMPARTMENT    Past Surgical History  Procedure Laterality Date  . Cesarean section  X2  11-16-2005  . Colposcopy    . Gynecologic cryosurgery  age 59  . Cholecystectomy N/A 12/04/2014    Procedure: LAPAROSCOPIC CHOLECYSTECTOMY;  Surgeon: Ralene Ok, MD;  Location: [facility];  Service: General;  Laterality: N/A;  . Myomectomy abdominal approach  12-29-2003  . Negative sleep study  2015  . Knee arthroscopy Right 05/25/2015    Procedure: RIGHT KNEE ARTHROSCOPY;  Surgeon: Paralee Cancel, MD;  Location: [facility];  Service: Orthopedics;  Laterality: Right;    There were no vitals filed for this visit.      Subjective Assessment - 06/26/15 1501    Subjective The patient did not take anti-inflammatory medications and notes increased pain and swelling today.   Pertinent History  Steroid injection 04/2015 and then had a knee effusion in mid February s/p a fall.   Patient Stated Goals Return to neightborhood walking and hiking-increase activity.   Currently in Pain? Yes   Pain  Score 3    Pain Location Knee   Pain Orientation Right   Pain Descriptors / Indicators Aching;Sore   Pain Type Chronic pain   Pain Onset More than a month ago   Pain Frequency Intermittent   Aggravating Factors  on feet for longer hours   Pain Relieving Factors ibuprofen, ice            [facility] Adult PT Treatment/Exercise - 06/26/15 1327    Exercises   Exercises Knee/Hip   Knee/Hip Exercises: Stretches   Active Hamstring Stretch 2 reps;30 seconds;Right   Passive Hamstring Stretch Right;1 rep;30 seconds   Knee/Hip Exercises: Supine   Quad Sets AROM;Right;10 reps   Quad Sets Limitations pain at end range   [employer] AROM;Right;5 reps   Terminal Knee Extension AROM;Right   Bridges Both;Strengthening;10 reps   Bridges with [employer] 10 reps   Straight Leg Raises Strengthening;Right;5 reps  with 2 lbs   Straight Leg Raise with External Rotation Strengthening;Right;5 reps  for VMO   Knee/Hip Exercises: Prone   Hamstring Curl 5 reps  then added 2 lb weight x 5 more reps   Modalities   Modalities Cryotherapy   Cryotherapy   Number Minutes Cryotherapy 10 Minutes   Cryotherapy Location Knee  anterior aspect   Type of Cryotherapy Ice pack  over anterior knee while performing ice massage post knee   Manual Therapy   Manual  Therapy Soft tissue mobilization   Manual therapy comments R lateral hamstring insertion   Soft tissue mobilization soft tissue mob to patient tolerance   Other Manual Therapy ice massage x 6 minutes                PT Education - 06/26/15 1359    Education provided Yes   Education Details edema mgmt and ice massage benefits vs cold pack   Person(s) Educated Patient   Methods Explanation   Comprehension Verbalized understanding          PT Short Term Goals - 06/24/15 1405    PT SHORT TERM GOAL #1   Title The patient will be independent with HEP for LE strengthening, stretching, and quad re-education.   Baseline Target date  07/12/2015   Time 4   Period Weeks   PT SHORT TERM GOAL #2   Title The patient will imrpove long arc quad to demonstrate 3 degrees or less of extension lag.   Baseline Target date 07/12/2015   Time 4   Period Weeks   PT SHORT TERM GOAL #3   Title The patient will reduce pain to 0/10 at rest in right knee.   Baseline Target date 07/12/2015   Time 4   Period Weeks   PT SHORT TERM GOAL #4   Title The patient will initiate community exercise program with guidance for progression of LE strengthening.   Baseline Target date 07/12/2015   Time 4   Period Weeks           PT Long Term Goals - 06/24/15 1405    PT LONG TERM GOAL #1   Title The patient will return to community walking without knee pain x 1 mile per subjective reports.   Baseline Target date 08/10/2015   Time 8   Period Weeks   PT LONG TERM GOAL #2   Title The patient will maintain single limb stance R LE x 8 seconds to demo improved tolerance to weight bearing through R LE.   Baseline Target date 08/10/2015   Time 8   Period Weeks   PT LONG TERM GOAL #3   Title The patient will demonstrate improved R heel strike during gait activities at initial stance phase for improved gait efficiency.   Baseline Target date 08/10/2015   Time 8   Period Weeks   PT LONG TERM GOAL #4   Title The patient will improve R LE quad strength to 5/5 for improved functional knee control.   Baseline Target date 08/10/2015   Time 8   Period Weeks   PT LONG TERM GOAL #5   Title The patient will demo independence with HEP progression.   Baseline Target date 08/10/2015   Time 8   Period Weeks               Plan - 06/26/15 1507    Clinical Impression Statement The patient with increased posterior knee swelling and tenderness today feeling as if reducing anti-inflammatories may contribute to presentation today.  PT did exercises in NWB position today due to increased inflammation and added ice.     Rehab Potential Good   PT Frequency 2x /  week   PT Duration 4 weeks  followed by 1x/week for 4 weeks   PT Treatment/Interventions Cryotherapy;Electrical Stimulation;Moist Heat;Therapeutic exercise;Therapeutic activities;Functional mobility training;Gait training;Stair training;Neuromuscular re-education;Patient/family education;Manual techniques;Passive range of motion   PT Next Visit Plan Check HEP, gait activities, hamstring strengthening (stool knee flexion), stretching  * check  on ice massage and posterior knee tenderness   Consulted and Agree with Plan of Care Patient      Patient will benefit from skilled therapeutic intervention in order to improve the following deficits and impairments:  Abnormal gait, Decreased range of motion, Difficulty walking, Pain, Decreased activity tolerance, Decreased strength, Increased edema, Impaired flexibility  Visit Diagnosis: Pain in right knee  Muscle weakness (generalized)     Problem List Patient Active Problem List   Diagnosis Date Noted  . Effusion of right knee 05/21/2015  . 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 Acute cholecystitis 12/04/2014  . Acute acalculous cholecystitis 12/04/2014  . Fibroid     Dimonique Bourdeau, PT 06/26/2015, 3:09 PM  [facility] [address] 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

## 2015-06-26 NOTE — Patient Instructions (Addendum)
Edema Reduction (Ice Massage)    Fill paper cups 1/2 full with water and freeze.  Rub ice cube on involved area for _6 minutes. Use paper or washcloth to protect other hand while holding ice. Do _2___ sessions per day, as needed.  Copyright  VHI. All rights reserved.   Cryotherapy Cryotherapy means treatment with cold. Ice or gel packs can be used to reduce both pain and swelling. Ice is the most helpful within the first 24 to 48 hours after an injury or flare-up from overusing a muscle or joint. Sprains, strains, spasms, burning pain, shooting pain, and aches can all be eased with ice. Ice can also be used when recovering from surgery. Ice is effective, has very few side effects, and is safe for most people to use. PRECAUTIONS  Ice is not a safe treatment option for people with:  Raynaud phenomenon. This is a condition affecting small blood vessels in the extremities. Exposure to cold may cause your problems to return.  Cold hypersensitivity. There are many forms of cold hypersensitivity, including:  Cold urticaria. Red, itchy hives appear on the skin when the tissues begin to warm after being iced.  Cold erythema. This is a red, itchy rash caused by exposure to cold.  Cold hemoglobinuria. Red blood cells break down when the tissues begin to warm after being iced. The hemoglobin that carry oxygen are passed into the urine because they cannot combine with blood proteins fast enough.  Numbness or altered sensitivity in the area being iced. If you have any of the following conditions, do not use ice until you have discussed cryotherapy with your caregiver:  Heart conditions, such as arrhythmia, angina, or chronic heart disease.  High blood pressure.  Healing wounds or open skin in the area being iced.  Current infections.  Rheumatoid arthritis.  Poor circulation.  Diabetes. Ice slows the blood flow in the region it is applied. This is beneficial when trying to stop inflamed  tissues from spreading irritating chemicals to surrounding tissues. However, if you expose your skin to cold temperatures for too long or without the proper protection, you can damage your skin or nerves. Watch for signs of skin damage due to cold. HOME CARE INSTRUCTIONS Follow these tips to use ice and cold packs safely.  Place a dry or damp towel between the ice and skin. A damp towel will cool the skin more quickly, so you may need to shorten the time that the ice is used.  For a more rapid response, add gentle compression to the ice.  Ice for no more than 10 to 20 minutes at a time. The bonier the area you are icing, the less time it will take to get the benefits of ice.  Check your skin after 5 minutes to make sure there are no signs of a poor response to cold or skin damage.  Rest 20 minutes or more between uses.  Once your skin is numb, you can end your treatment. You can test numbness by very lightly touching your skin. The touch should be so light that you do not see the skin dimple from the pressure of your fingertip. When using ice, most people will feel these normal sensations in this order: cold, burning, aching, and numbness.  Do not use ice on someone who cannot communicate their responses to pain, such as small children or people with dementia. HOW TO MAKE AN ICE PACK Ice packs are the most common way to use ice therapy. Other methods  include ice massage, ice baths, and cryosprays. Muscle creams that cause a cold, tingly feeling do not offer the same benefits that ice offers and should not be used as a substitute unless recommended by your caregiver. To make an ice pack, do one of the following:  Place crushed ice or a bag of frozen vegetables in a sealable plastic bag. Squeeze out the excess air. Place this bag inside another plastic bag. Slide the bag into a pillowcase or place a damp towel between your skin and the bag.  Mix 3 parts water with 1 part rubbing alcohol. Freeze  the mixture in a sealable plastic bag. When you remove the mixture from the freezer, it will be slushy. Squeeze out the excess air. Place this bag inside another plastic bag. Slide the bag into a pillowcase or place a damp towel between your skin and the bag. SEEK MEDICAL CARE IF:  You develop white spots on your skin. This may give the skin a blotchy (mottled) appearance.  Your skin turns blue or pale.  Your skin becomes waxy or hard.  Your swelling gets worse. MAKE SURE YOU:   Understand these instructions.  Will watch your condition.  Will get help right away if you are not doing well or get worse.   This information is not intended to replace advice given to you by your health care provider. Make sure you discuss any questions you have with your health care provider.   Document Released: 11/01/2010 Document Revised: 03/28/2014 Document Reviewed: 11/01/2010 Elsevier Interactive Patient Education [employer].

## 2015-07-02 ENCOUNTER — Ambulatory Visit (INDEPENDENT_AMBULATORY_CARE_PROVIDER_SITE_OTHER): Payer: 59 | Admitting: Gynecology

## 2015-07-02 ENCOUNTER — Encounter: Payer: Self-pay | Admitting: Gynecology

## 2015-07-02 VITALS — BP 122/84 | Ht 70.0 in | Wt 248.0 lb

## 2015-07-02 DIAGNOSIS — Z01419 Encounter for gynecological examination (general) (routine) without abnormal findings: Secondary | ICD-10-CM

## 2015-07-02 DIAGNOSIS — N393 Stress incontinence (female) (male): Secondary | ICD-10-CM | POA: Diagnosis not present

## 2015-07-02 DIAGNOSIS — Z30432 Encounter for removal of intrauterine contraceptive device: Secondary | ICD-10-CM | POA: Diagnosis not present

## 2015-07-02 DIAGNOSIS — N882 Stricture and stenosis of cervix uteri: Secondary | ICD-10-CM

## 2015-07-02 NOTE — Patient Instructions (Signed)

## 2015-07-02 NOTE — Addendum Note (Signed)
Addended by: Nelva Nay on: 07/02/2015 12:53 PM   Modules accepted: Orders, SmartSet

## 2015-07-02 NOTE — Progress Notes (Signed)
   Martha Vincent, Dr. [birth year] [id number]        50 y.o.  [id number]  for annual exam.  Doing well. Several issues noted below.  Past medical history,surgical history, problem list, medications, allergies, family history and social history were all reviewed and documented as reviewed in the EPIC chart.  ROS:  Performed with pertinent positives and negatives included in the history, assessment and plan.   Additional significant findings :  none   Exam: Caryn Bee assistant Filed Vitals:   07/02/15 1153  BP: 122/84  Height: 5\' 10"  (1.778 m)  Weight: 248 lb (112.492 kg)   General appearance:  Normal affect, orientation and appearance. Skin: Grossly normal HEENT: Without gross lesions.  No cervical or supraclavicular adenopathy. Thyroid normal.  Lungs:  Clear without wheezing, rales or rhonchi Cardiac: RR, without RMG Abdominal:  Soft, nontender, without masses, guarding, rebound, organomegaly or hernia Breasts:  Examined lying and sitting without masses, retractions, discharge or axillary adenopathy. Pelvic:  Ext/BUS/vagina normal  Cervix with cervical stenosis. IUD string not visualized.  Pap smear done  Uterus anteverted, normal size, shape and contour, midline and mobile nontender   Adnexa without masses or tenderness    Anus and perineum normal   Rectovaginal normal sphincter tone without palpated masses or tenderness.   Procedure: Cervix and upper vagina was cleansed with Betadine and a paracervical block using 1% lidocaine was placed, 8 cc total. Single-tooth tenaculum anterior lip stabilization. Cervix was dilated with a disposable dilator. Using a [place] forcep opening and closing within the endocervical canal the IUD string was grasped and her old Mirena IUD was removed, shown to the patient and discarded.   Assessment/Plan:  50 y.o. [id number] female for annual exam, without menses, Mirena IUD.   1. Mirena IUD.  Placed 11/2009. Overdue to have removed. Patient desires to  have it removed and due to cervical stenosis a paracervical block with cervical dilatation was performed with successful removal of her Mirena IUD. Recommended backup contraception over the next 6 months or so until her menstrual history declares. Will call me if she has any issues far as bleeding. 2. Pap smear 11/2011. Pap smear done today. No history of significant abnormal Pap smears. 3. Mammography 04/2015. Continue with annual mammography when due. SBE monthly reviewed. 4. Colonoscopy 2016. Repeat at their recommended interval. 5. Urinary incontinence. Patient notes some mild stress incontinence but also some urgency. Notices this at night adding up 3-4 times and voiding small amounts. Has eliminated caffeine from her diet. Has had negative urinalyses at her primary physician's office. Check urinalysis today. Options for management reviewed discussion for referral to urology for evaluation and treatment options. Patient asked about being referred to [facility] and we will go ahead and help her make those arrangements. 6. Health maintenance. No routine blood work done as patient reports this done elsewhere.  Follow up in one year, sooner as needed.   Anastasio Auerbach MD, 12:35 PM 07/02/2015

## 2015-07-03 LAB — URINALYSIS W MICROSCOPIC + REFLEX CULTURE
BILIRUBIN URINE: NEGATIVE
Bacteria, UA: NONE SEEN [HPF]
CASTS: NONE SEEN [LPF]
Crystals: NONE SEEN [HPF]
Glucose, UA: NEGATIVE
Hgb urine dipstick: NEGATIVE
KETONES UR: NEGATIVE
Nitrite: NEGATIVE
PH: 5.5 (ref 5.0–8.0)
Protein, ur: NEGATIVE
Specific Gravity, Urine: 1.025 (ref 1.001–1.035)
Yeast: NONE SEEN [HPF]

## 2015-07-03 LAB — PAP IG W/ RFLX HPV ASCU

## 2015-07-04 LAB — URINE CULTURE: Colony Count: 2000

## 2015-07-06 ENCOUNTER — Telehealth: Payer: Self-pay | Admitting: *Deleted

## 2015-07-06 NOTE — Telephone Encounter (Signed)
Appointment on 08/12/15 @ 10:00am with Dr.Matthews notes faxed to [phone] address is [address]  Left message for pt to call on home #

## 2015-07-06 NOTE — Telephone Encounter (Signed)
-----   Message from Anastasio Auerbach, MD sent at 07/02/2015 12:41 PM EDT ----- Patient interested in being referred to Dr. [place] Pink Urogynecologist from [place]. She apparently has a clinic here in [place] and patient would like a referral there. The patient is a physician also.

## 2015-07-07 ENCOUNTER — Ambulatory Visit: Payer: 59 | Admitting: Rehabilitative and Restorative Service Providers"

## 2015-07-07 DIAGNOSIS — M25561 Pain in right knee: Secondary | ICD-10-CM | POA: Diagnosis not present

## 2015-07-07 DIAGNOSIS — M6281 Muscle weakness (generalized): Secondary | ICD-10-CM

## 2015-07-07 NOTE — Therapy (Signed)
[facility] [address] [place], [place], [address] Phone: [phone]   Fax:  [phone]  Physical Therapy Treatment  Patient Details  Name: Martha Vincent, Dr. MRN: [id number] Date of Birth: 1965/09/01 Referring Provider: Marissa Nestle, MD  Encounter Date: 07/07/2015      PT End of Session - 07/07/15 1344    Visit Number 6   Number of Visits 12   Date for PT Re-Evaluation 08/11/15   Authorization Type private insurance   PT Start Time 4742   PT Stop Time 1315   PT Time Calculation (min) 40 min   Activity Tolerance Patient tolerated treatment well   Behavior During Therapy [facility] for tasks assessed/performed      Past Medical History  Diagnosis Date  . History of uterine fibroid   . History of cervical dysplasia   . History of gestational diabetes   . Chondromalacia of knee     LEFT COMPARTMENT    Past Surgical History  Procedure Laterality Date  . Cesarean section  X2  11-16-2005  . Colposcopy    . Gynecologic cryosurgery  age 50  . Cholecystectomy N/A 12/04/2014    Procedure: LAPAROSCOPIC CHOLECYSTECTOMY;  Surgeon: Ralene Ok, MD;  Location: [facility];  Service: General;  Laterality: N/A;  . Myomectomy abdominal approach  12-29-2003  . Negative sleep study  2015  . Knee arthroscopy Right 05/25/2015    Procedure: RIGHT KNEE ARTHROSCOPY;  Surgeon: Paralee Cancel, MD;  Location: [facility];  Service: Orthopedics;  Laterality: Right;    There were no vitals filed for this visit.      Subjective Assessment - 07/07/15 1238    Subjective The patient is moving better.  She is taking anti-inflammatory medications as needed.  She no longer has a pulling sensation with HEP.  She notes mornings are okay and swelling and discomfort increase in afternoon.  Has to stretch hamstring before moving.   Pertinent History  Steroid injection 04/2015 and then had a knee effusion in mid February s/p a fall.    Limitations Standing   Patient Stated Goals Return to neightborhood walking and hiking-increase activity.   Currently in Pain? No/denies           [facility] Adult PT Treatment/Exercise - 07/07/15 1337    Ambulation/Gait   Ambulation/Gait Yes   Ambulation/Gait Assistance 7: Independent   Ambulation Distance (Feet) 500 Feet   Assistive device None   Gait Comments The patient is demonstrating more consistent R heel strike during gait activities without cues.  She lacks terminal knee extension at times, but can correct with effort/concentration on mechanics.    Self-Care   Self-Care Other Self-Care Comments   Other Self-Care Comments  Discussed aquatic exercises and how to progress at community pool to improve strength.   Exercises   Exercises Knee/Hip   Knee/Hip Exercises: Stretches   Active Hamstring Stretch Right;2 reps;30 seconds   Knee/Hip Exercises: Standing   Heel Raises Both;15 reps   Heel Raises Limitations Attempted single limb on right, patient has to use UEs to perform.     Lateral Step Up Right;10 reps;Hand Hold: 2   Forward Step Up Right;10 reps;Hand Hold: 2   Functional Squat 5 reps   Functional Squat Limitations Performed with side stepping R and L sides.  Patient c/o discomfort anterior right knee and stopped exercise   Stairs 4 steps with reciprocal pattern ascending and step to pattern descending.   SLS in parallel bars decreasing UE  support   Knee/Hip Exercises: Seated   Stool Scoot - Round Trips 50 feet x 4 repetitions.   Knee/Hip Exercises: Supine   Straight Leg Raise with External Rotation Right;10 reps  2 lb weights   Knee/Hip Exercises: Sidelying   Hip ABduction Right;10 reps  2 lb weights   Knee/Hip Exercises: Prone   Hip Extension 10 reps   Hip Extension Limitations added 2lb weight for 5/10 reps for resistance.  Patient needed cues to avoid trunk rotation.           PT Education - 07/07/15 1344    Education provided Yes   Education Details  aquatic exercises to try when returning to [facility]/ pool   Person(s) Educated Patient   Methods Explanation;Handout   Comprehension Verbalized understanding          PT Short Term Goals - 07/07/15 1345    PT SHORT TERM GOAL #1   Title The patient will be independent with HEP for LE strengthening, stretching, and quad re-education.   Baseline Met on 07/07/2015   Time 4   Period Weeks   Status Achieved   PT SHORT TERM GOAL #2   Title The patient will imrpove long arc quad to demonstrate 3 degrees or less of extension lag.   Baseline Target date 07/12/2015   Time 4   Period Weeks   Status On-going   PT SHORT TERM GOAL #3   Title The patient will reduce pain to 0/10 at rest in right knee.   Baseline Met on 07/07/2015   Time 4   Period Weeks   Status Achieved   PT SHORT TERM GOAL #4   Title The patient will initiate community exercise program with guidance for progression of LE strengthening.   Baseline Target date 07/12/2015   Time 4   Period Weeks   Status On-going           PT Long Term Goals - 06/24/15 1405    PT LONG TERM GOAL #1   Title The patient will return to community walking without knee pain x 1 mile per subjective reports.   Baseline Target date 08/10/2015   Time 8   Period Weeks   PT LONG TERM GOAL #2   Title The patient will maintain single limb stance R LE x 8 seconds to demo improved tolerance to weight bearing through R LE.   Baseline Target date 08/10/2015   Time 8   Period Weeks   PT LONG TERM GOAL #3   Title The patient will demonstrate improved R heel strike during gait activities at initial stance phase for improved gait efficiency.   Baseline Target date 08/10/2015   Time 8   Period Weeks   PT LONG TERM GOAL #4   Title The patient will improve R LE quad strength to 5/5 for improved functional knee control.   Baseline Target date 08/10/2015   Time 8   Period Weeks   PT LONG TERM GOAL #5   Title The patient will demo independence with HEP  progression.   Baseline Target date 08/10/2015   Time 8   Period Weeks           Plan - 07/07/15 1345    Clinical Impression Statement The patient has met 2 STGs.  PT to continue progressing HEP towards community wellness program as tolerated.  Continue quad, hamstring strengthening, hp strengthening and overall flexibility.     Rehab Potential Good   PT Treatment/Interventions Cryotherapy;Electrical Stimulation;Moist Heat;Therapeutic  exercise;Therapeutic activities;Functional mobility training;Gait training;Stair training;Neuromuscular re-education;Patient/family education;Manual techniques;Passive range of motion   PT Next Visit Plan Check HEP, gait activities, hamstring strengthening (stool knee flexion), stretching  * check on ice massage and posterior knee tenderness   Consulted and Agree with Plan of Care Patient      Patient will benefit from skilled therapeutic intervention in order to improve the following deficits and impairments:  Abnormal gait, Decreased range of motion, Difficulty walking, Pain, Decreased activity tolerance, Decreased strength, Increased edema, Impaired flexibility  Visit Diagnosis: Pain in right knee  Muscle weakness (generalized)  Generalized muscle weakness     Problem List Patient Active Problem List   Diagnosis Date Noted  . Effusion of right knee 05/21/2015  . 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 Acute cholecystitis 12/04/2014  . Acute acalculous cholecystitis 12/04/2014  . Fibroid     Jazyiah Yiu, PT 07/07/2015, 1:47 PM  [facility] [address] [place], [place], [address] Phone: [phone]   Fax:  [phone]  Name: Almeter Westhoff, Dr. MRN: [id number] Date of Birth: [birth year]

## 2015-07-07 NOTE — Patient Instructions (Signed)
Buoyant Kick Board Use       Copyright  VHI. All rights reserved.  Hip Abduction / Adduction    Swing right leg out to the right and then to the left across the body. Swing right leg out to the right and then to the left behind the body. Repeat sequence _10-15__ times. Repeat with other leg.   Copyright  VHI. All rights reserved.  Bicycle With Noodle    Move legs as if riding a bicycle for _3__ minutes. *Can prop on steps or in corner of pool to begin.  Copyright  VHI. All rights reserved.  Hip Flexion / Extension: Flutter    Move from hips, one leg forward, the other backward in short "flutter" kicks. Deeper water.  Flutter _2___ minutes per session.  Copyright  VHI. All rights reserved.

## 2015-07-08 NOTE — Telephone Encounter (Signed)
Patient called and said we do have her permission to leave her a message on home recorder regarding referral. I called and left the details on her ans machine at home.

## 2015-07-10 ENCOUNTER — Ambulatory Visit: Payer: 59 | Admitting: Rehabilitative and Restorative Service Providers"

## 2015-07-10 DIAGNOSIS — M25561 Pain in right knee: Secondary | ICD-10-CM | POA: Diagnosis not present

## 2015-07-10 DIAGNOSIS — M6281 Muscle weakness (generalized): Secondary | ICD-10-CM

## 2015-07-10 NOTE — Patient Instructions (Signed)
Strengthening: Terminal Knee Extension (Supine)    With right knee over pillow, straighten knee by tightening muscles on top of thigh. Keep bottom of knee on bolster. Repeat __10__ times per set. Do _2___ sets per session. Do _2___ sessions per day.  http://orth.exer.us/627   Copyright  VHI. All rights reserved.  KNEE: Extension, Long Arc Quad (Band)    Place band around leg and under other foot. Pull band forward until knee is straight. Hold _10__ seconds. Use ____red _ band. _10__ reps per set, _2__ sets per day, __6_ days per week  Copyright  VHI. All rights reserved.

## 2015-07-12 NOTE — Therapy (Signed)
[facility] [address] [place], [place], [address] Phone: [phone]   Fax:  [phone]  Physical Therapy Treatment  Patient Details  Name: Martha Vincent, Dr. MRN: [id number] Date of Birth: Sep 05, 1965 Referring Provider: Marissa Nestle, MD  Encounter Date: 07/10/2015      PT End of Session - 07/11/15 2156    Visit Number 7   Number of Visits 12   Date for PT Re-Evaluation 08/11/15   Authorization Type private insurance   PT Start Time 1232   PT Stop Time 1322 *stayed for ice pack at end of session   PT Time Calculation (min) 50 min   Activity Tolerance Patient tolerated treatment well   Behavior During Therapy [facility] for tasks assessed/performed      Past Medical History  Diagnosis Date  . History of uterine fibroid   . History of cervical dysplasia   . History of gestational diabetes   . Chondromalacia of knee     LEFT COMPARTMENT    Past Surgical History  Procedure Laterality Date  . Cesarean section  X2  11-16-2005  . Colposcopy    . Gynecologic cryosurgery  age 50  . Cholecystectomy N/A 12/04/2014    Procedure: LAPAROSCOPIC CHOLECYSTECTOMY;  Surgeon: Ralene Ok, MD;  Location: [facility];  Service: General;  Laterality: N/A;  . Myomectomy abdominal approach  12-29-2003  . Negative sleep study  2015  . Knee arthroscopy Right 05/25/2015    Procedure: RIGHT KNEE ARTHROSCOPY;  Surgeon: Paralee Cancel, MD;  Location[facility];  Service: Orthopedics;  Laterality: Right;    There were no vitals filed for this visit.      Subjective Assessment - 07/12/15 2147    Subjective The patient was sore from PT last session.  She is performing HEP regularly.   Patient Stated Goals Return to neightborhood walking and hiking-increase activity.   Currently in Pain? No/denies            [facility] PT Assessment - 07/11/15 2148    Ambulation/Gait   Ambulation/Gait Yes   Ambulation/Gait Assistance  7: Independent   Ambulation Distance (Feet) 500 Feet   Assistive device None           [facility] Adult PT Treatment/Exercise - 07/11/15 2148    Ambulation/Gait   Gait Comments The patient continues to demonstrate improving R heel strike with gait.  Warmed up on treadmill x 8 minutes at 0% incline and increased to 2% incline with speed 2.4-2.6 mph.     Self-Care   Self-Care Other Self-Care Comments   Other Self-Care Comments  Discussed aquatic exercises and how to progress at community pool to improve strength.   Exercises   Exercises Knee/Hip   Knee/Hip Exercises: Stretches   Active Hamstring Stretch Right;2 reps;30 seconds   Knee/Hip Exercises: Machines for Strengthening   Cybex Leg Press 10 lbs for right single leg press x 10 reps, then increased to 40 lbs with both legs.     Knee/Hip Exercises: Standing           Terminal Knee Extension AROM;Right;10 reps  using 2" block for mini-step ups and eccentric control   Lateral Step Up Right;10 reps;Hand Hold: 2   Forward Step Up Right;10 reps;Hand Hold: 2   Step Down Right;10 reps;Hand Hold: 2  from 2" block working on eccentric lowering   Functional Squat 5 reps  mini squats               Knee/Hip  Exercises: Seated   Long Arc Quad Strengthening;Right;Left;10 reps  using red theraband and adjusting tension to increase resist   Stool Scoot - Round Trips 100 feet with 2 rest breaks and cues to reduce use of left lower extremity   Knee/Hip Exercises: Supine   Quad Sets AROM;Strengthening;10 reps;Right   Quad Sets Limitations Performed with ball under knee to facilitate greater quad contraction.     Terminal Knee Extension AROM;Strengthening;10 reps  worked on in multiple positions to reduce extensor lag   Straight Leg Raise with External Rotation --                     Cryotherapy   Number Minutes Cryotherapy 10 Minutes   Cryotherapy Location Knee   Type of Cryotherapy Ice pack          PT Short Term Goals - 07/11/15  2157    PT SHORT TERM GOAL #1   Title The patient will be independent with HEP for LE strengthening, stretching, and quad re-education.   Baseline Met on 07/07/2015   Time 4   Period Weeks   Status Achieved   PT SHORT TERM GOAL #2   Title The patient will imrpove long arc quad to demonstrate 3 degrees or less of extension lag.   Baseline Target date 07/12/2015   Time 4   Period Weeks   Status On-going   PT SHORT TERM GOAL #3   Title The patient will reduce pain to 0/10 at rest in right knee.   Baseline Met on 07/07/2015   Time 4   Period Weeks   Status Achieved   PT SHORT TERM GOAL #4   Title The patient will initiate community exercise program with guidance for progression of LE strengthening.   Baseline PT has provided cues for water exercises, HEP and treadmill training   Time 4   Period Weeks   Status Achieved           PT Long Term Goals - 06/24/15 1405    PT LONG TERM GOAL #1   Title The patient will return to community walking without knee pain x 1 mile per subjective reports.   Baseline Target date 08/10/2015   Time 8   Period Weeks   PT LONG TERM GOAL #2   Title The patient will maintain single limb stance R LE x 8 seconds to demo improved tolerance to weight bearing through R LE.   Baseline Target date 08/10/2015   Time 8   Period Weeks   PT LONG TERM GOAL #3   Title The patient will demonstrate improved R heel strike during gait activities at initial stance phase for improved gait efficiency.   Baseline Target date 08/10/2015   Time 8   Period Weeks   PT LONG TERM GOAL #4   Title The patient will improve R LE quad strength to 5/5 for improved functional knee control.   Baseline Target date 08/10/2015   Time 8   Period Weeks   PT LONG TERM GOAL #5   Title The patient will demo independence with HEP progression.   Baseline Target date 08/10/2015   Time 8   Period Weeks               Plan - 07/11/15 2158    Clinical Impression Statement The patient  met another STG with ability to perform community program as her schedule allows.  PT to continue progressing knee stabilization activities in order to improve eccentric  and single limb stance control on the right side.     PT Treatment/Interventions Cryotherapy;Electrical Stimulation;Moist Heat;Therapeutic exercise;Therapeutic activities;Functional mobility training;Gait training;Stair training;Neuromuscular re-education;Patient/family education;Manual techniques;Passive range of motion   PT Next Visit Plan Consider reducing to 1x/week as patient integrates into > community exercise, leg press, treadmill, posterior knee tenderness?, stretching, stabilization.   PT Home Exercise Plan See patient instructions   Consulted and Agree with Plan of Care Patient      Patient will benefit from skilled therapeutic intervention in order to improve the following deficits and impairments:  Abnormal gait, Decreased range of motion, Difficulty walking, Pain, Decreased activity tolerance, Decreased strength, Increased edema, Impaired flexibility  Visit Diagnosis: Pain in right knee  Muscle weakness (generalized)     Problem List Patient Active Problem List   Diagnosis Date Noted  . Effusion of right knee 05/21/2015  . 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 Acute cholecystitis 12/04/2014  . Acute acalculous cholecystitis 12/04/2014  . Fibroid     Keenya Matera, PT 07/12/2015, 10:01 PM  [facility] [address] [place], [place], [address] Phone: [phone]   Fax:  [phone]  Name: Murry Khiev, Dr. MRN: [id number] Date of Birth: 1965-06-20

## 2015-07-14 ENCOUNTER — Ambulatory Visit: Payer: 59 | Admitting: Rehabilitative and Restorative Service Providers"

## 2015-07-14 DIAGNOSIS — M25561 Pain in right knee: Secondary | ICD-10-CM | POA: Diagnosis not present

## 2015-07-14 DIAGNOSIS — M6281 Muscle weakness (generalized): Secondary | ICD-10-CM

## 2015-07-14 NOTE — Therapy (Signed)
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 History of uterine fibroid   . History of cervical dysplasia   . History of gestational diabetes   . Chondromalacia of knee     LEFT COMPARTMENT    Past Surgical History  Procedure Laterality Date  . Cesarean section  X2  11-16-2005  . Colposcopy    . Gynecologic cryosurgery  age 36  . Cholecystectomy N/A 12/04/2014    Procedure: LAPAROSCOPIC CHOLECYSTECTOMY;  Surgeon: Ralene Ok, MD;  Location: [place] [place];  Service: General;  Laterality: N/A;  . Myomectomy abdominal approach  12-29-2003  . Negative sleep study  2015  . Knee arthroscopy Right 05/25/2015    Procedure: RIGHT KNEE ARTHROSCOPY;  Surgeon: Paralee Cancel, MD;  Location: [facility];  Service: Orthopedics;  Laterality: Right;    There were no vitals filed for this visit.      Subjective Assessment - 07/14/15 1348    Subjective The patient had some soreness after PT last week.  She did not need anti-inflammatory for 2 days.  Took ibuprofen today.   Patient Stated Goals Return to neightborhood walking and hiking-increase activity.   Currently in Pain? No/denies              [facility] Adult PT Treatment/Exercise - 07/14/15 1335    Ambulation/Gait   Ambulation/Gait Yes    Ambulation/Gait Assistance 7: Independent   Ambulation Distance (Feet) 400 Feet   Assistive device None   Gait Comments Gait x 230 ft x 3 times with cues on heel strike and to reduce antalgic pattern as able.  Heel and toe walking with pain noted during attempted heel walking.   Exercises   Exercises Knee/Hip   Knee/Hip Exercises: Machines for Strengthening   Other Machine Elliptical x 2 minutes forward and 1 min backwards   Knee/Hip Exercises: Standing   Knee Flexion Right;10 reps   Knee Flexion Limitations with red theraband   Side Lunges 10 reps  with side step mini squat within tolerable range   Terminal Knee Extension AROM;Right;10 reps  using 2" block for mini-step ups and eccentric control   Lateral Step Up Right;10 reps;Hand Hold: 2   Step Down Right;10 reps;Hand Hold: 2  from 2" block working on eccentric lowering   Wall Squat 10 reps   Wall Squat Limitations used ball to squeeze in order to reduce anterior knee pain bilaterally   Gait Training Standing gait with emphasis on loadin initial contact to mid stance   Other Standing Knee Exercises Stride foot position with rocking ant/posteriorly x 10 reps.   Knee/Hip Exercises: Supine   Quad Sets AROM;Strengthening;10 reps;Right   Cryotherapy   Number Minutes Cryotherapy 8 Minutes   Cryotherapy Location Knee  posterior aspect   Type  of Cryotherapy Ice massage   Manual Therapy   Manual Therapy --  ice massage with soft tissue mobilization R med hamstring          PT Short Term Goals - 07/11/15 2157    PT SHORT TERM GOAL #1   Title The patient will be independent with HEP for LE strengthening, stretching, and quad re-education.   Baseline Met on 07/07/2015   Time 4   Period Weeks   Status Achieved   PT SHORT TERM GOAL #2   Title The patient will imrpove long arc quad to demonstrate 3 degrees or less of extension lag.   Baseline Target date 07/12/2015   Time 4   Period Weeks   Status On-going   PT SHORT TERM GOAL #3    Title The patient will reduce pain to 0/10 at rest in right knee.   Baseline Met on 07/07/2015   Time 4   Period Weeks   Status Achieved   PT SHORT TERM GOAL #4   Title The patient will initiate community exercise program with guidance for progression of LE strengthening.   Baseline PT has provided cues for water exercises, HEP and treadmill training   Time 4   Period Weeks   Status Achieved           PT Long Term Goals - 06/24/15 1405    PT LONG TERM GOAL #1   Title The patient will return to community walking without knee pain x 1 mile per subjective reports.   Baseline Target date 08/10/2015   Time 8   Period Weeks   PT LONG TERM GOAL #2   Title The patient will maintain single limb stance R LE x 8 seconds to demo improved tolerance to weight bearing through R LE.   Baseline Target date 08/10/2015   Time 8   Period Weeks   PT LONG TERM GOAL #3   Title The patient will demonstrate improved R heel strike during gait activities at initial stance phase for improved gait efficiency.   Baseline Target date 08/10/2015   Time 8   Period Weeks   PT LONG TERM GOAL #4   Title The patient will improve R LE quad strength to 5/5 for improved functional knee control.   Baseline Target date 08/10/2015   Time 8   Period Weeks   PT LONG TERM GOAL #5   Title The patient will demo independence with HEP progression.   Baseline Target date 08/10/2015   Time 8   Period Weeks               Plan - 07/14/15 1344    Clinical Impression Statement The patient continues with clicking at terminal knee extension.  She is not able to tolerate heel walking due to discomfort in terminal knee position during weight  bearing activities.  PT continuing to progress eccentric control of right quads and end range control of R knee.   PT Treatment/Interventions Cryotherapy;Electrical Stimulation;Moist Heat;Therapeutic exercise;Therapeutic activities;Functional mobility training;Gait training;Stair  training;Neuromuscular re-education;Patient/family education;Manual techniques;Passive range of motion   PT Next Visit Plan Consider reducing to 1x/week as patient integrates into > community exercise, leg press, treadmill, posterior knee tenderness?, stretching, stabilization.   Consulted and Agree with Plan of Care Patient      Patient will benefit from skilled therapeutic intervention in order to improve the following deficits and impairments:  Abnormal gait, Decreased range of motion, Difficulty walking, Pain, Decreased activity tolerance, Decreased strength, Increased edema, Impaired  flexibility  Visit Diagnosis: Pain in right knee  Muscle weakness (generalized)     Problem List Patient Active Problem List   Diagnosis Date Noted  . Effusion of right knee 05/21/2015  . 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 Acute cholecystitis 12/04/2014  . Acute acalculous cholecystitis 12/04/2014  . Fibroid     Kyngston Pickelsimer, PT 07/14/2015, 1:49 PM  [facility] [address] [place], [place], [address] Phone: [phone]   Fax:  [phone]  Name: Martha Vincent, Dr. MRN: [id number] Date of Birth: [birth year]

## 2015-07-17 ENCOUNTER — Ambulatory Visit: Payer: 59 | Admitting: Rehabilitative and Restorative Service Providers"

## 2015-07-17 DIAGNOSIS — M25561 Pain in right knee: Secondary | ICD-10-CM

## 2015-07-17 DIAGNOSIS — M6281 Muscle weakness (generalized): Secondary | ICD-10-CM

## 2015-07-17 NOTE — Therapy (Signed)
[facility] [address] [place], [place], [address] Phone: [phone]   Fax:  [phone]  Physical Therapy Treatment  Patient Details  Name: Martha Vincent, Dr. MRN: [id number] Date of Birth: [birth year] Referring Provider: Marissa Nestle, MD  Encounter Date: 07/17/2015      PT End of Session - 07/17/15 2011    Visit Number 9   Number of Visits 12   Date for PT Re-Evaluation 08/11/15   Authorization Type private insurance   PT Start Time 3546   PT Stop Time 1328   PT Time Calculation (min) 53 min   Activity Tolerance Patient tolerated treatment well   Behavior During Therapy [facility] for tasks assessed/performed      Past Medical History  Diagnosis Date  . History of uterine fibroid   . History of cervical dysplasia   . History of gestational diabetes   . Chondromalacia of knee     LEFT COMPARTMENT    Past Surgical History  Procedure Laterality Date  . Cesarean section  X2  11-16-2005  . Colposcopy    . Gynecologic cryosurgery  age 2  . Cholecystectomy N/A 12/04/2014    Procedure: LAPAROSCOPIC CHOLECYSTECTOMY;  Surgeon: Ralene Ok, MD;  Location: [facility];  Service: General;  Laterality: N/A;  . Myomectomy abdominal approach  12-29-2003  . Negative sleep study  2015  . Knee arthroscopy Right 05/25/2015    Procedure: RIGHT KNEE ARTHROSCOPY;  Surgeon: Paralee Cancel, MD;  Location: [facility];  Service: Orthopedics;  Laterality: Right;    There were no vitals filed for this visit.      Subjective Assessment - 07/17/15 1237    Subjective The patient without pain today.                           [facility] Adult PT Treatment/Exercise - 07/17/15 2120    Exercises   Exercises Knee/Hip   Knee/Hip Exercises: Stretches   Active Hamstring Stretch Right;2 reps;30 seconds   Active Hamstring Stretch Limitations Added gastroc stretch seated with towel   Other Knee/Hip Stretches  Prone terminal knee extension stretching with 3 lb ankle weight.   Knee/Hip Exercises: Standing   Wall Squat 10 reps   Wall Squat Limitations Used ball for VMO/hip adductor squeeze.  Patient had pain under patella.   Knee/Hip Exercises: Seated   Long Arc Quad 10 reps   [facility] 10 reps   Other Seated Knee/Hip Exercises terminal knee extension with 3 lb weights   Knee/Hip Exercises: Sidelying   Hip ABduction Right;10 reps  3 reps   Knee/Hip Exercises: Prone   Hamstring Curl 10 reps  3 lb weights   Hip Extension 10 reps  3 lbs   Prone Knee Hang Weights (lbs) 3 lbs   Straight Leg Raises 10 reps  3 lbs   Modalities   Modalities Electrical Stimulation   Electrical Stimulation   Electrical Stimulation Location R VMO   Electrical Stimulation Action quad contraction   Electrical Stimulation Parameters 10/10   Electrical Stimulation Goals Neuromuscular facilitation                  PT Short Term Goals - 07/11/15 2157    PT SHORT TERM GOAL #1   Title The patient will be independent with HEP for LE strengthening, stretching, and quad re-education.   Baseline Met on 07/07/2015   Time 4   Period Weeks   Status Achieved  PT SHORT TERM GOAL #2   Title The patient will imrpove long arc quad to demonstrate 3 degrees or less of extension lag.   Baseline Target date 07/12/2015   Time 4   Period Weeks   Status On-going   PT SHORT TERM GOAL #3   Title The patient will reduce pain to 0/10 at rest in right knee.   Baseline Met on 07/07/2015   Time 4   Period Weeks   Status Achieved   PT SHORT TERM GOAL #4   Title The patient will initiate community exercise program with guidance for progression of LE strengthening.   Baseline PT has provided cues for water exercises, HEP and treadmill training   Time 4   Period Weeks   Status Achieved           PT Long Term Goals - 06/24/15 1405    PT LONG TERM GOAL #1   Title The patient will return to community walking without  knee pain x 1 mile per subjective reports.   Baseline Target date 08/10/2015   Time 8   Period Weeks   PT LONG TERM GOAL #2   Title The patient will maintain single limb stance R LE x 8 seconds to demo improved tolerance to weight bearing through R LE.   Baseline Target date 08/10/2015   Time 8   Period Weeks   PT LONG TERM GOAL #3   Title The patient will demonstrate improved R heel strike during gait activities at initial stance phase for improved gait efficiency.   Baseline Target date 08/10/2015   Time 8   Period Weeks   PT LONG TERM GOAL #4   Title The patient will improve R LE quad strength to 5/5 for improved functional knee control.   Baseline Target date 08/10/2015   Time 8   Period Weeks   PT LONG TERM GOAL #5   Title The patient will demo independence with HEP progression.   Baseline Target date 08/10/2015   Time 8   Period Weeks               Plan - 07/17/15 2012    Clinical Impression Statement The patient has soft tissue tightness in posterior aspect of the knee which limits terminal knee extension.  PT continuing to progress with R LE strengthening, ROM, and stabilization for improved functional status.   PT Treatment/Interventions Cryotherapy;Electrical Stimulation;Moist Heat;Therapeutic exercise;Therapeutic activities;Functional mobility training;Gait training;Stair training;Neuromuscular re-education;Patient/family education;Manual techniques;Passive range of motion   PT Next Visit Plan Consider reducing to 1x/week as patient integrates into > community exercise, leg press, treadmill, posterior knee tenderness?, stretching, stabilization.   Consulted and Agree with Plan of Care Patient      Patient will benefit from skilled therapeutic intervention in order to improve the following deficits and impairments:  Abnormal gait, Decreased range of motion, Difficulty walking, Pain, Decreased activity tolerance, Decreased strength, Increased edema, Impaired  flexibility  Visit Diagnosis: Pain in right knee  Muscle weakness (generalized)     Problem List Patient Active Problem List   Diagnosis Date Noted  . Effusion of right knee 05/21/2015  . 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 Acute cholecystitis 12/04/2014  . Acute acalculous cholecystitis 12/04/2014  . Fibroid     Whitley Strycharz, PT 07/17/2015, 9:42 PM  [facility] [address] [place], [place], [address] Phone: [phone]   Fax:  [phone]  Name: Martha Vincent, Dr. MRN: [id number] Date of Birth:  05/08/1965     

## 2015-07-21 ENCOUNTER — Ambulatory Visit: Payer: 59 | Attending: Orthopedic Surgery | Admitting: Rehabilitative and Restorative Service Providers"

## 2015-07-21 DIAGNOSIS — M25561 Pain in right knee: Secondary | ICD-10-CM | POA: Insufficient documentation

## 2015-07-21 DIAGNOSIS — M6281 Muscle weakness (generalized): Secondary | ICD-10-CM | POA: Insufficient documentation

## 2015-07-21 NOTE — Therapy (Signed)
[facility] [address] [place], [place], [address] Phone: [phone]   Fax:  [phone]  Physical Therapy Treatment  Patient Details  Name: Martha Vincent, Dr. MRN: [id number] Date of Birth: Jul 20, 1965 Referring Provider: Marissa Nestle, MD  Encounter Date: 07/21/2015      PT End of Session - 07/21/15 1343    Visit Number 10   Number of Visits 12   Date for PT Re-Evaluation 08/11/15   Authorization Type private insurance   PT Start Time 1234   PT Stop Time 1315   PT Time Calculation (min) 41 min   Activity Tolerance Patient tolerated treatment well   Behavior During Therapy [facility] for tasks assessed/performed      Past Medical History  Diagnosis Date  . History of uterine fibroid   . History of cervical dysplasia   . History of gestational diabetes   . Chondromalacia of knee     LEFT COMPARTMENT    Past Surgical History  Procedure Laterality Date  . Cesarean section  X2  11-16-2005  . Colposcopy    . Gynecologic cryosurgery  age 63  . Cholecystectomy N/A 12/04/2014    Procedure: LAPAROSCOPIC CHOLECYSTECTOMY;  Surgeon: Ralene Ok, MD;  Location: [facility];  Service: General;  Laterality: N/A;  . Myomectomy abdominal approach  12-29-2003  . Negative sleep study  2015  . Knee arthroscopy Right 05/25/2015    Procedure: RIGHT KNEE ARTHROSCOPY;  Surgeon: Paralee Cancel, MD;  Location: [facility];  Service: Orthopedics;  Laterality: Right;    There were no vitals filed for this visit.      Subjective Assessment - 07/21/15 1237    Subjective The patient took meds last Friday night to sleep due to pain after therapy Friday.   Patient Stated Goals Return to neightborhood walking and hiking-increase activity.   Currently in Pain? No/denies            [facility] Adult PT Treatment/Exercise - 07/21/15 1346    Ambulation/Gait   Ambulation/Gait Yes   Ambulation/Gait Assistance 7: Independent   Ambulation Distance (Feet) 400 Feet   Assistive device None   Gait Comments Patient arrives with antalgic gait pattern.  When sore, she does not hit R heel strike at initial contact phase of gait.   Exercises   Exercises Knee/Hip   Knee/Hip Exercises: Stretches   Active Hamstring Stretch Right;2 reps;30 seconds   Active Hamstring Stretch Limitations Added gastroc stretch seated with towel   Other Knee/Hip Stretches Prone terminal knee extension stretching with 3 lb ankle weight.   Knee/Hip Exercises: Machines for Strengthening   Cybex Leg Press 40 lbs with both legs and then patient began performing single leg R with 40 lb weight.  On leg press, she achieves full knee extension   Knee/Hip Exercises: Standing   Terminal Knee Extension AROM;10 reps  with green theraband   Wall Squat 10 reps   Other Standing Knee Exercises R single limb stance pressing L knee into wall for VMO contraction   Knee/Hip Exercises: Supine   Short Arc Quad Sets AROM;10 reps  no weight   Short Arc Quad Sets Limitations Then performed with 2 lbs with pain during end range extension.   Knee/Hip Exercises: Sidelying   Hip ABduction Right;10 reps  3 reps   Knee/Hip Exercises: Prone   Hamstring Curl 10 reps  3 lb weights   Prone Knee Hang Weights (lbs) 3 lbs.   Modalities   Modalities Cryotherapy   Cryotherapy  Number Minutes Cryotherapy 4 Minutes   Cryotherapy Location Knee   Type of Cryotherapy Ice massage  posterior knee                  PT Short Term Goals - 07/11/15 2157    PT SHORT TERM GOAL #1   Title The patient will be independent with HEP for LE strengthening, stretching, and quad re-education.   Baseline Met on 07/07/2015   Time 4   Period Weeks   Status Achieved   PT SHORT TERM GOAL #2   Title The patient will imrpove long arc quad to demonstrate 3 degrees or less of extension lag.   Baseline Target date 07/12/2015   Time 4   Period Weeks   Status On-going   PT SHORT TERM GOAL  #3   Title The patient will reduce pain to 0/10 at rest in right knee.   Baseline Met on 07/07/2015   Time 4   Period Weeks   Status Achieved   PT SHORT TERM GOAL #4   Title The patient will initiate community exercise program with guidance for progression of LE strengthening.   Baseline PT has provided cues for water exercises, HEP and treadmill training   Time 4   Period Weeks   Status Achieved           PT Long Term Goals - 06/24/15 1405    PT LONG TERM GOAL #1   Title The patient will return to community walking without knee pain x 1 mile per subjective reports.   Baseline Target date 08/10/2015   Time 8   Period Weeks   PT LONG TERM GOAL #2   Title The patient will maintain single limb stance R LE x 8 seconds to demo improved tolerance to weight bearing through R LE.   Baseline Target date 08/10/2015   Time 8   Period Weeks   PT LONG TERM GOAL #3   Title The patient will demonstrate improved R heel strike during gait activities at initial stance phase for improved gait efficiency.   Baseline Target date 08/10/2015   Time 8   Period Weeks   PT LONG TERM GOAL #4   Title The patient will improve R LE quad strength to 5/5 for improved functional knee control.   Baseline Target date 08/10/2015   Time 8   Period Weeks   PT LONG TERM GOAL #5   Title The patient will demo independence with HEP progression.   Baseline Target date 08/10/2015   Time 8   Period Weeks               Plan - 07/21/15 1344    Clinical Impression Statement The patient is continuing to improve control of R knee during terminal extension.  With resistance added, she has posterior knee pain.  PT to continue working towards [place].   PT Treatment/Interventions Cryotherapy;Electrical Stimulation;Moist Heat;Therapeutic exercise;Therapeutic activities;Functional mobility training;Gait training;Stair training;Neuromuscular re-education;Patient/family education;Manual techniques;Passive range of motion    PT Next Visit Plan Reduce to 1x/week next week.  Treadmill, leg press, ice massage posterior knee tenderness, stretching, stabilization.   Consulted and Agree with Plan of Care Patient      Patient will benefit from skilled therapeutic intervention in order to improve the following deficits and impairments:  Abnormal gait, Decreased range of motion, Difficulty walking, Pain, Decreased activity tolerance, Decreased strength, Increased edema, Impaired flexibility  Visit Diagnosis: Pain in right knee  Muscle weakness (generalized)  Generalized muscle weakness  Problem List Patient Active Problem List   Diagnosis Date Noted  . Effusion of right knee 05/21/2015  . 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 Acute cholecystitis 12/04/2014  . Acute acalculous cholecystitis 12/04/2014  . Fibroid     Christon Gallaway, PT 07/21/2015, 9:12 PM  [facility] [address] [place], [place], [address] Phone: [phone]   Fax:  [phone]  Name: Martha Vincent, Dr. MRN: [id number] Date of Birth: Feb 12, 1966

## 2015-07-24 ENCOUNTER — Ambulatory Visit: Payer: 59 | Admitting: Rehabilitative and Restorative Service Providers"

## 2015-07-24 DIAGNOSIS — M25561 Pain in right knee: Secondary | ICD-10-CM | POA: Diagnosis not present

## 2015-07-24 DIAGNOSIS — M6281 Muscle weakness (generalized): Secondary | ICD-10-CM

## 2015-07-24 NOTE — Therapy (Signed)
Martha Vincent. Martha Vincent, Martha Vincent, Martha Vincent Phone: [phone]   Fax:  [phone]  Physical Therapy Treatment  Patient Details  Name: Martha Vincent, Dr. MRN: [id number] Date of Birth: 1965/11/29 Referring Provider: Marissa Nestle, MD  Encounter Date: 07/24/2015      PT End of Session - 07/24/15 1344    Visit Number 11   Number of Visits 12   Date for PT Re-Evaluation 08/11/15   Authorization Type private insurance   PT Start Time 1240   PT Stop Time 1320   PT Time Calculation (min) 40 min   Activity Tolerance Patient tolerated treatment well   Behavior During Therapy [facility] for tasks assessed/performed      Past Medical History  Diagnosis Date  . History of uterine fibroid   . History of cervical dysplasia   . History of gestational diabetes   . Chondromalacia of knee     LEFT COMPARTMENT    Past Surgical History  Procedure Laterality Date  . Cesarean section  X2  11-16-2005  . Colposcopy    . Gynecologic cryosurgery  age 50  . Cholecystectomy N/A 12/04/2014    Procedure: LAPAROSCOPIC CHOLECYSTECTOMY;  Surgeon: Ralene Ok, MD;  Location: [place];  Service: General;  Laterality: N/A;  . Myomectomy abdominal approach  12-29-2003  . Negative sleep study  2015  . Knee arthroscopy Right 05/25/2015    Procedure: RIGHT KNEE ARTHROSCOPY;  Surgeon: Paralee Cancel, MD;  Location: [facility];  Service: Orthopedics;  Laterality: Right;    There were no vitals filed for this visit.      Subjective Assessment - 07/24/15 1339    Subjective The patient is doing HEP, plans to reduce frequency beginning next week.   Pertinent History  Steroid injection 04/2015 and then had a knee effusion in mid February s/p a fall.   Patient Stated Goals Return to neightborhood walking and hiking-increase activity.   Currently in Pain? No/denies                         [facility] Adult PT  Treatment/Exercise - 07/24/15 1340    Ambulation/Gait   Ambulation/Gait Yes   Ambulation/Gait Assistance 7: Independent   Ambulation Distance (Feet) --  treadmill x 5 minutes at 2.8 mph with UE support   Stairs Yes   Stairs Assistance --  pain with descending steps in anterior knee/ step to    Gait Comments Treadmill working on bilateral heel strike and stance phase extension.   Exercises   Exercises Knee/Hip   Knee/Hip Exercises: Standing   Heel Raises Right   Heel Raises Limitations Attempted single leg, however patient uses increased weight bearing through UEs to support weight   Side Lunges 10 reps   Lateral Step Up Right;10 reps;Hand Hold: 2  2 " step with heel tap on L LE   Functional Squat 5 reps   Other Standing Knee Exercises right single limb stance activities   Knee/Hip Exercises: Seated   Long Arc Quad 10 reps   Long Arc Quad Weight --  5 lb, then 3 lb due to discomfort with 5   Long Arc Quad Limitations performed LAQ and then slow eccentric lowering holding at 1/2 way point.   Stool Scoot - Round Trips 120 feet without rest breaks                  PT Short Term Goals - 07/11/15 2157  PT SHORT TERM GOAL #1   Title The patient will be independent with HEP for LE strengthening, stretching, and quad re-education.   Baseline Met on 07/07/2015   Time 4   Period Weeks   Status Achieved   PT SHORT TERM GOAL #2   Title The patient will imrpove long arc quad to demonstrate 3 degrees or less of extension lag.   Baseline Target date 07/12/2015   Time 4   Period Weeks   Status On-going   PT SHORT TERM GOAL #3   Title The patient will reduce pain to 0/10 at rest in right knee.   Baseline Met on 07/07/2015   Time 4   Period Weeks   Status Achieved   PT SHORT TERM GOAL #4   Title The patient will initiate community exercise program with guidance for progression of LE strengthening.   Baseline PT has provided cues for water exercises, HEP and treadmill training    Time 4   Period Weeks   Status Achieved           PT Long Term Goals - 06/24/15 1405    PT LONG TERM GOAL #1   Title The patient will return to community walking without knee pain x 1 mile per subjective reports.   Baseline Target date 08/10/2015   Time 8   Period Weeks   PT LONG TERM GOAL #2   Title The patient will maintain single limb stance R LE x 8 seconds to demo improved tolerance to weight bearing through R LE.   Baseline Target date 08/10/2015   Time 8   Period Weeks   PT LONG TERM GOAL #3   Title The patient will demonstrate improved R heel strike during gait activities at initial stance phase for improved gait efficiency.   Baseline Target date 08/10/2015   Time 8   Period Weeks   PT LONG TERM GOAL #4   Title The patient will improve R LE quad strength to 5/5 for improved functional knee control.   Baseline Target date 08/10/2015   Time 8   Period Weeks   PT LONG TERM GOAL #5   Title The patient will demo independence with HEP progression.   Baseline Target date 08/10/2015   Time 8   Period Weeks               Plan - 07/24/15 1344    Clinical Impression Statement The patient has improved mechanics with walking.  She still c/o antalgic gait when fatigued.  PT to continue working to [place] with reduction in frequency.    PT Treatment/Interventions Cryotherapy;Electrical Stimulation;Moist Heat;Therapeutic exercise;Therapeutic activities;Functional mobility training;Gait training;Stair training;Neuromuscular re-education;Patient/family education;Manual techniques;Passive range of motion   PT Next Visit Plan Reduce to 1x/week next week.  Treadmill, leg press, ice massage posterior knee tenderness, stretching, stabilization.   Consulted and Agree with Plan of Care Patient      Patient will benefit from skilled therapeutic intervention in order to improve the following deficits and impairments:  Abnormal gait, Decreased range of motion, Difficulty walking, Pain,  Decreased activity tolerance, Decreased strength, Increased edema, Impaired flexibility  Visit Diagnosis: Pain in right knee  Muscle weakness (generalized)  Generalized muscle weakness     Problem List Patient Active Problem List   Diagnosis Date Noted  . Effusion of right knee 05/21/2015  . 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 Acute cholecystitis 12/04/2014  . Acute acalculous cholecystitis 12/04/2014  . Fibroid  [place], PT 07/24/2015, 1:45 PM  [facility] [address] [place] [place], Martha Vincent, [address] Phone: [phone]   Fax:  [phone]  Name: Martha Vincent, Dr. MRN: [id number] Date of Birth: December 26, 1965

## 2015-08-07 ENCOUNTER — Ambulatory Visit: Payer: 59 | Admitting: Rehabilitative and Restorative Service Providers"

## 2015-08-07 DIAGNOSIS — M25561 Pain in right knee: Secondary | ICD-10-CM

## 2015-08-07 DIAGNOSIS — M6281 Muscle weakness (generalized): Secondary | ICD-10-CM

## 2015-08-07 NOTE — Therapy (Signed)
[facility] [address] [place], [place], [address] Phone: [phone]   Fax:  [phone]  Physical Therapy Treatment  Patient Details  Name: Martha Vincent, Dr. MRN: [id number] Date of Birth: [birth year] Referring Provider: Marissa Nestle, MD  Encounter Date: 08/07/2015      PT End of Session - 08/07/15 1557    Visit Number 12   Number of Visits 12   Date for PT Re-Evaluation 08/11/15   Authorization Type private insurance   PT Start Time 1230   PT Stop Time 1320   PT Time Calculation (min) 50 min   Activity Tolerance Patient tolerated treatment well   Behavior During Therapy [facility] for tasks assessed/performed      Past Medical History  Diagnosis Date  . History of uterine fibroid   . History of cervical dysplasia   . History of gestational diabetes   . Chondromalacia of knee     LEFT COMPARTMENT    Past Surgical History  Procedure Laterality Date  . Cesarean section  X2  11-16-2005  . Colposcopy    . Gynecologic cryosurgery  age 9  . Cholecystectomy N/A 12/04/2014    Procedure: LAPAROSCOPIC CHOLECYSTECTOMY;  Surgeon: Ralene Ok, MD;  Location: [place];  Service: General;  Laterality: N/A;  . Myomectomy abdominal approach  12-29-2003  . Negative sleep study  2015  . Knee arthroscopy Right 05/25/2015    Procedure: RIGHT KNEE ARTHROSCOPY;  Surgeon: Paralee Cancel, MD;  [facility];  Service: Orthopedics;  Laterality: Right;    There were no vitals filed for this visit.      Subjective Assessment - 08/07/15 1556    Subjective The patient reports she has been doing much better with her knee, however she put up a tent and kneeled on it on Mother's Day and got significant pain with swelling and tenderness in medial/distal region since Sunday.   Pertinent History  Steroid injection 04/2015 and then had a knee effusion in mid February s/p a fall.   Patient Stated Goals Return to  neightborhood walking and hiking-increase activity.   Currently in Pain? No/denies  tender medial/distal knee right side       THERAPEUTIC EXERCISE: Quad set for terminal knee extension x 10 reps to warm up Long sitting SLR x 10 reps Prone contract relax for hamstring stretch for terminal knee extension Standing terminal knee extension with feet in stride position x 10 reps using blue theraband Standing step ups anteriorly, laterally and posteriorly up to 2" and 4" step Mini squats adding side step progressing to blue theraband resistance x 10 steps to each side x 3 repetitions Leg press x 50 lbs bilaterally, then reduced to right LE only with manual assistance x 10 repetitions  Gait: Stair negotiaton x 3 reps with pain descending in reciprocal pattern. The patient can easily ascend steps reciprocal pattern and still prefers for step to pattern descending steps Gait with R heel strike at initial stance phase        PT Education - 08/07/15 1557    Education provided Yes   Education Details HEP: hip extension with cues to avoid trunk rotation.   Person(s) Educated Patient   Methods Explanation;Demonstration;Handout   Comprehension Verbalized understanding;Returned demonstration          PT Short Term Goals - 07/11/15 2157    PT SHORT TERM GOAL #1   Title The patient will be independent with HEP for LE strengthening, stretching, and quad re-education.  Baseline Met on 07/07/2015   Time 4   Period Weeks   Status Achieved   PT SHORT TERM GOAL #2   Title The patient will imrpove long arc quad to demonstrate 3 degrees or less of extension lag.   Baseline Target date 07/12/2015   Time 4   Period Weeks   Status On-going   PT SHORT TERM GOAL #3   Title The patient will reduce pain to 0/10 at rest in right knee.   Baseline Met on 07/07/2015   Time 4   Period Weeks   Status Achieved   PT SHORT TERM GOAL #4   Title The patient will initiate community exercise program with  guidance for progression of LE strengthening.   Baseline PT has provided cues for water exercises, HEP and treadmill training   Time 4   Period Weeks   Status Achieved           PT Long Term Goals - 08/07/15 1309    PT LONG TERM GOAL #1   Title The patient will return to community walking without knee pain x 1 mile per subjective reports.   Baseline "sore" but not pain, clicking has resolved.     Time 8   Period Weeks   Status Partially Met   PT LONG TERM GOAL #2   Title The patient will maintain single limb stance R LE x 8 seconds to demo improved tolerance to weight bearing through R LE.   Baseline Met on 08/07/2015   Time 8   Period Weeks   Status Achieved   PT LONG TERM GOAL #3   Title The patient will demonstrate improved R heel strike during gait activities at initial stance phase for improved gait efficiency.   Baseline Target date 08/10/2015   Time 8   Period Weeks   Status Achieved   PT LONG TERM GOAL #4   Title The patient will improve R LE quad strength to 5/5 for improved functional knee control.   Baseline Target date 08/10/2015   Time 8   Period Weeks   Status On-going   PT LONG TERM GOAL #5   Title The patient will demo independence with HEP progression.   Baseline Met on 08/07/2015   Time 8   Period Weeks   Status Achieved               Plan - 08/07/15 1559    Clinical Impression Statement The patient has met 3 LTGs.  PT to check quad strength and modify goals next visit to include addressing descending of stairs (eccentric control of R quad), and extensor lag 5 degrees on right side.  PT to emphasize terminal knee extension during functional tasks and high level, dynamic strengthening.   PT Next Visit Plan Reduce to 1x/week next week.  Treadmill, leg press, ice massage posterior knee tenderness, stretching, stabilization.  RENEW NEXT VISIT   Consulted and Agree with Plan of Care Patient      Patient will benefit from skilled therapeutic  intervention in order to improve the following deficits and impairments:     Visit Diagnosis: Pain in right knee  Muscle weakness (generalized)  Generalized muscle weakness     Problem List Patient Active Problem List   Diagnosis Date Noted  . Effusion of right knee 05/21/2015  . 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 Acute cholecystitis 12/04/2014  . Acute acalculous cholecystitis 12/04/2014  . Fibroid     Leslieanne Cobarrubias, PT 08/07/2015, 4:00  PM  [facility] [address] [place], [place], [address] Phone: [phone]   Fax:  [phone]  Name: Martha Vincent, Dr. MRN: [id number] Date of Birth: November 21, 1965

## 2015-08-07 NOTE — Patient Instructions (Signed)
Hip Extension: Prone    Tighten gluteal muscle. Lift one leg _10-20__ times. Restabilize pelvis. Repeat with other leg. Keep pelvis still. Be sure pelvis does not rotate and back does not arch. Do _2__ sets, _1__ times per day.  http://ss.exer.us/65   Copyright  VHI. All rights reserved.

## 2015-08-12 DIAGNOSIS — N3281 Overactive bladder: Secondary | ICD-10-CM | POA: Diagnosis not present

## 2015-08-12 DIAGNOSIS — N39491 Coital incontinence: Secondary | ICD-10-CM | POA: Diagnosis not present

## 2015-08-20 ENCOUNTER — Telehealth: Payer: Self-pay | Admitting: *Deleted

## 2015-08-20 NOTE — Telephone Encounter (Signed)
Pt called request Rx for testosterone cream to be called into [facility]. Please advise

## 2015-08-20 NOTE — Telephone Encounter (Signed)
Okay for testosterone 2% cream refill at [facility] 1 year

## 2015-08-21 ENCOUNTER — Ambulatory Visit: Payer: 59 | Attending: Orthopedic Surgery | Admitting: Rehabilitative and Restorative Service Providers"

## 2015-08-21 DIAGNOSIS — M6281 Muscle weakness (generalized): Secondary | ICD-10-CM | POA: Diagnosis not present

## 2015-08-21 DIAGNOSIS — M25561 Pain in right knee: Secondary | ICD-10-CM | POA: Insufficient documentation

## 2015-08-21 MED ORDER — NONFORMULARY OR COMPOUNDED ITEM: Status: DC

## 2015-08-21 NOTE — Addendum Note (Signed)
Addended by: Rudell Cobb M on: 08/21/2015 12:07 PM   Modules accepted: Orders

## 2015-08-21 NOTE — Therapy (Signed)
[facility] [address] [place], [place], [address] Phone: [phone]   Fax:  [phone]  Physical Therapy Treatment  Patient Details  Name: Martha Vincent, Dr. MRN: [id number] Date of Birth: Jun 02, 1965 Referring Provider: Marissa Nestle, MD  Encounter Date: 08/21/2015      PT End of Session - 08/21/15 1145    Visit Number 13   Number of Visits 18   Date for PT Re-Evaluation 10/20/15   Authorization Type private insurance   PT Start Time 0804   PT Stop Time 0850   PT Time Calculation (min) 46 min   Activity Tolerance Patient tolerated treatment well   Behavior During Therapy [facility] for tasks assessed/performed      Past Medical History  Diagnosis Date  . History of uterine fibroid   . History of cervical dysplasia   . History of gestational diabetes   . Chondromalacia of knee     LEFT COMPARTMENT    Past Surgical History  Procedure Laterality Date  . Cesarean section  X2  11-16-2005  . Colposcopy    . Gynecologic cryosurgery  age 50  . Cholecystectomy N/A 12/04/2014    Procedure: LAPAROSCOPIC CHOLECYSTECTOMY;  Surgeon: Ralene Ok, MD;  Location: [facility];  Service: General;  Laterality: N/A;  . Myomectomy abdominal approach  12-29-2003  . Negative sleep study  2015  . Knee arthroscopy Right 05/25/2015    Procedure: RIGHT KNEE ARTHROSCOPY;  Surgeon: Paralee Cancel, MD;  Location: [facility];  Service: Orthopedics;  Laterality: Right;    There were no vitals filed for this visit.      Subjective Assessment - 08/21/15 0806    Subjective The patient reports that she has up/down performance of the knee.  She had clicking the past 2 days.  She got 2 new pairs of tennis shoes for hiking/exercise.  She sti;ll has difficulty on the steps.    Patient Stated Goals Return to neightborhood walking and hiking-increase activity.   Currently in Pain? No/denies      THERAPEUTIC EXERCISE: Standing  step-ups to 6" step with discomfort over distal patella/patellar tendon, modified to 4" step with UE support with lateral step ups x 10 reps with tactile cues for terminal knee extension Step up/over x 4 " step x 10 reps with UE support with PT providing tactile cues terminal knee extension at mid stance of motion Attempted leg press R LE x 30 pounds with pain today, reduced to 20 pounds with pain, held exercise Supine quad set with pillow under R knee and cues for quad control x 10 reps progressing to SLR with 4 lb weight Prone knee flexion initially with 4lb weight and reduced to no weight due to "clicking" and apprehension with extension Prone hip extension Seated LAQ with 4 lb weight and then no weight emphasizing terminal knee extension at end range IT band stretch supine   SELF CARE/HOME MANAGEMENT: Discussed technique of HEP and performing exercises regularly and correctly with emphasis on not compensation to improve control during terminal extension and quad control with eccentric lowering on steps.       PT Education - 08/21/15 1144    Education provided Yes   Education Details Took video on patient's personal phone for more accurate performance of HEP including :quad set, SLR, LAQ, short art quad, knee flexion, hamstring stretch, steps ups lateral and ant   Person(s) Educated Patient   Methods Explanation;Demonstration;Other (comment)  video using patient's device   Comprehension Verbalized  understanding;Returned demonstration;Verbal cues required          PT Short Term Goals - 08/21/15 1151    PT SHORT TERM GOAL #1   Title The patient will improve R knee extension to -3 degrees (from 6 degrees) of extensor lag.   Baseline Target date 09/19/2015   Time 4   Period Weeks   Status New   PT SHORT TERM GOAL #2   Title The patient will negotiate 4 steps with reciprocal pattern and one handrail without increase in pain.   Baseline Target date 09/19/2015   Time 4   Period Weeks    Status New   PT SHORT TERM GOAL #3   Title Continue to progress HEP to patient tolerance.   Baseline Target date 09/19/2015   Time 4   Period Weeks           PT Long Term Goals - 08/21/15 1147    PT LONG TERM GOAL #1   Title The patient will report return to hiking/outdoor activities for improved mobility.   Baseline Target date 10/20/2015   Time 6   Period Weeks   Status New   PT LONG TERM GOAL #2   Title The patient will be able to negotiate steps ascending and descending without handrails x 4 steps x 3 reps without knee pain.   Baseline Target date 10/20/2015   Time 6   Period Weeks   Status New   PT LONG TERM GOAL #3   Title The patient will be indep with progression of HEP.   Baseline Target date 10/20/2015   Time 6   Period Weeks   Status New   PT LONG TERM GOAL #4   Title The patient will improve R LE quad strength to 5/5 for improved functional knee control.   Baseline Target date 10/20/2015   Time 6   Period Weeks   Status On-going               Plan - 08/21/15 1150    Clinical Impression Statement New goals established at today's visit.  Currently, PT is checking in on progress at  lower frequency in order to progress home program, correct technique on home program, and focus on achievement of greater R knee extension/strength in R leg/functional mobility.     Rehab Potential Good   PT Frequency 1x / week   PT Duration 6 weeks   PT Treatment/Interventions Cryotherapy;Electrical Stimulation;Moist Heat;Therapeutic exercise;Therapeutic activities;Functional mobility training;Gait training;Stair training;Neuromuscular re-education;Patient/family education;Manual techniques;Passive range of motion   PT Next Visit Plan terminal knee extension, training for improved eccentric quad control on stairs, strengthening/stability, treadmill/leg press.   Consulted and Agree with Plan of Care Patient      Patient will benefit from skilled therapeutic intervention in order to  improve the following deficits and impairments:  Abnormal gait, Decreased range of motion, Difficulty walking, Pain, Decreased activity tolerance, Decreased strength, Increased edema, Impaired flexibility  Visit Diagnosis: Pain in right knee  Muscle weakness (generalized)  Generalized muscle weakness     Problem List Patient Active Problem List   Diagnosis Date Noted  . Effusion of right knee 05/21/2015  . 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 Acute cholecystitis 12/04/2014  . Acute acalculous cholecystitis 12/04/2014  . Fibroid     Birdie Beveridge, PT 08/21/2015, 11:56 AM  [facility] [address] [place], [place], [address] Phone: [phone]   Fax:  [phone]  Name: Scarlette Gammill, Dr.  MRN: [id number] Date of Birth: 08-19-1965

## 2015-08-21 NOTE — Telephone Encounter (Signed)
Rx called in, I left message at her office letting her know Rx has been called in.

## 2015-08-28 ENCOUNTER — Ambulatory Visit: Payer: 59 | Admitting: Physical Therapy

## 2015-08-28 DIAGNOSIS — M25561 Pain in right knee: Secondary | ICD-10-CM

## 2015-08-28 DIAGNOSIS — M6281 Muscle weakness (generalized): Secondary | ICD-10-CM

## 2015-08-28 NOTE — Therapy (Signed)
[facility] [address] 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 History of uterine fibroid   . History of cervical dysplasia   . History of gestational diabetes   . Chondromalacia of knee     LEFT COMPARTMENT    Past Surgical History  Procedure Laterality Date  . Cesarean section  X2  11-16-2005  . Colposcopy    . Gynecologic cryosurgery  age 44  . Cholecystectomy N/A 12/04/2014    Procedure: LAPAROSCOPIC CHOLECYSTECTOMY;  Surgeon: Ralene Ok, MD;  Location: [place];  Service: General;  Laterality: N/A;  . Myomectomy abdominal approach  12-29-2003  . Negative sleep study  2015  . Knee arthroscopy Right 05/25/2015    Procedure: RIGHT KNEE ARTHROSCOPY;  Surgeon: Paralee Cancel, MD;  Location: [facility];  Service: Orthopedics;  Laterality: Right;    There were no vitals filed for this visit.      Subjective Assessment - 08/28/15 1250    Subjective Pt notes decreased pain and "clicking" in R knee when taking ibuprofen and applying ice to R knee. Puts ice on the back of her knee only. Bought new hiking boots in anticipation of upcoming trip to [place].    Pertinent History  Steroid injection 04/2015 and then had a knee effusion in mid February s/p a fall.   Patient Stated Goals Return to  neightborhood walking and hiking-increase activity.   Currently in Pain? No/denies            [facility] PT Assessment - 08/28/15 0001    Special Tests   Hip Special Tests  Ober's Test   Ober's Test   Findings Negative   Side Right                     [facility] Adult PT Treatment/Exercise - 08/28/15 0001    Ambulation/Gait   Ambulation/Gait Yes   Ambulation/Gait Assistance 7: Independent   Ambulation Distance (Feet) 600 Feet   Assistive device None   Gait Pattern Step-through pattern  R tibial external rotation noted throughout gait cycle   Ambulation Surface Level;Unlevel;Indoor;Outdoor;Paved;Grass   Stairs Yes   Stairs Assistance 7: Independent   Stairs Assistance Details (indicate cue type and reason) Prior to applying ice + compression to R knee, pt negotiated two 8" stairs x2 trials with increased medial R knee pain, palpable/audible crepitus during ascent and descent. After ice x5 minutes, noted no pain, reduced crepitus during negotiation of 2 steps x3 trials. Attempted to utilize kinesio tape at R knee over origin/insertion of R VMO for proprioceptive input; however, kinesio tape grossly ineffective during this session.   Stair Management Technique No rails;Forwards;Alternating pattern   Number of Stairs 10  2 steps x5 trials   Height of Stairs 8   Curb 7:  Independent   Gait Comments During gait over unlevel, paved and grass surfaces, noted external rotation of R tibia throughout gait cycle. When pt made aware of tibial external rotation, pt attempted to self-correct; howevr, neutral tibial rotation caused pain in inferomedial patella as well as lateral to R joint line. Recommended pt not attempt to self-correct tibial rotation at this time.    Exercises   Exercises Other Exercises   Other Exercises  Continue to note R quad lag with supine R SLR x5 reps.                PT Education - 08/28/15 1256    Education provided Yes   Education Details Recommended  pt apply ice and compression to bother anterior and posterior aspect of R knee to control ST swelling at medial knee.    Person(s) Educated Patient   Methods Explanation   Comprehension Verbalized understanding          PT Short Term Goals - 08/21/15 1151    PT SHORT TERM GOAL #1   Title The patient will improve R knee extension to -3 degrees (from 6 degrees) of extensor lag.   Baseline Target date 09/19/2015   Time 4   Period Weeks   Status New   PT SHORT TERM GOAL #2   Title The patient will negotiate 4 steps with reciprocal pattern and one handrail without increase in pain.   Baseline Target date 09/19/2015   Time 4   Period Weeks   Status New   PT SHORT TERM GOAL #3   Title Continue to progress HEP to patient tolerance.   Baseline Target date 09/19/2015   Time 4   Period Weeks           PT Long Term Goals - 08/21/15 1147    PT LONG TERM GOAL #1   Title The patient will report return to hiking/outdoor activities for improved mobility.   Baseline Target date 10/20/2015   Time 6   Period Weeks   Status New   PT LONG TERM GOAL #2   Title The patient will be able to negotiate steps ascending and descending without handrails x 4 steps x 3 reps without knee pain.   Baseline Target date 10/20/2015   Time 6   Period Weeks   Status New   PT LONG TERM GOAL #3   Title The patient will be indep with progression of HEP.   Baseline Target date 10/20/2015   Time 6   Period Weeks   Status New   PT LONG TERM GOAL #4   Title The patient will improve R LE quad strength to 5/5 for improved functional knee control.   Baseline Target date 10/20/2015   Time 6   Period Weeks   Status On-going               Plan - 08/28/15 1321    Clinical Impression Statement Session focused on assessing/addressing R patellar tracking during functional activities causing pain/discomfort with emphasis on stair negotiation. Noted decreased crepitus, no R knee pain with stair negotiation after applying  ice and compression to VMO. Recommended pt modify icing regimen by adding compression (Ace bandage) and applying ice to both anterior and posterior aspect of R knee. Also noted increased R tibial external rotation during gait over unlevel, outdoor surfaces. Question if external rotation is due to unconscious attempt to stabilize knee tibiofemoral joint vs. tight lateral structures causing rotation.    Rehab Potential Good  PT Frequency 1x / week   PT Duration 6 weeks   PT Treatment/Interventions Cryotherapy;Electrical Stimulation;Moist Heat;Therapeutic exercise;Therapeutic activities;Functional mobility training;Gait training;Stair training;Neuromuscular re-education;Patient/family education;Manual techniques;Passive range of motion   PT Next Visit Plan terminal knee extension, training for improved eccentric quad control on stairs, strengthening/stability, treadmill/leg press.   Consulted and Agree with Plan of Care Patient      Patient will benefit from skilled therapeutic intervention in order to improve the following deficits and impairments:  Abnormal gait, Decreased range of motion, Difficulty walking, Pain, Decreased activity tolerance, Decreased strength, Increased edema, Impaired flexibility  Visit Diagnosis: Pain in right knee  Muscle weakness (generalized)     Problem List Patient Active Problem List   Diagnosis Date Noted  . Effusion of right knee 05/21/2015  . 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 Acute cholecystitis 12/04/2014  . Acute acalculous cholecystitis 12/04/2014  . Fibroid     Billie Ruddy, PT, DPT [facility] [address] [place], [place], [address] Phone: [phone]   Fax:  [phone] 08/28/2015, 1:29 PM  Name: Martha Vincent, Dr. MRN: [id number] Date of Birth: Nov 14, 1965

## 2015-09-04 ENCOUNTER — Ambulatory Visit: Payer: 59 | Admitting: Rehabilitative and Restorative Service Providers"

## 2015-09-04 DIAGNOSIS — M25561 Pain in right knee: Secondary | ICD-10-CM

## 2015-09-04 DIAGNOSIS — N3941 Urge incontinence: Secondary | ICD-10-CM | POA: Diagnosis not present

## 2015-09-04 DIAGNOSIS — M6281 Muscle weakness (generalized): Secondary | ICD-10-CM | POA: Diagnosis not present

## 2015-09-04 DIAGNOSIS — N393 Stress incontinence (female) (male): Secondary | ICD-10-CM | POA: Diagnosis not present

## 2015-09-04 DIAGNOSIS — R278 Other lack of coordination: Secondary | ICD-10-CM | POA: Diagnosis not present

## 2015-09-04 NOTE — Therapy (Signed)
[facility] [address] 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 History of uterine fibroid   . History of cervical dysplasia   . History of gestational diabetes   . Chondromalacia of knee     LEFT COMPARTMENT    Past Surgical History  Procedure Laterality Date  . Cesarean section  X2  11-16-2005  . Colposcopy    . Gynecologic cryosurgery  age 43  . Cholecystectomy N/A 12/04/2014    Procedure: LAPAROSCOPIC CHOLECYSTECTOMY;  Surgeon: Ralene Ok, MD;  Location: [facility];  Service: General;  Laterality: N/A;  . Myomectomy abdominal approach  12-29-2003  . Negative sleep study  2015  . Knee arthroscopy Right 05/25/2015    Procedure: RIGHT KNEE ARTHROSCOPY;  Surgeon: Paralee Cancel, MD;  Location: [facility];  Service: Orthopedics;  Laterality: Right;    There were no vitals filed for this visit.      Subjective Assessment - 09/04/15 1522    Subjective The patient does not have pain, however reports continued difficulty descending steps and intermitent swelling.  She leaves next week for extended vacation.   Pertinent History  Steroid injection 04/2015 and then had a knee effusion in mid February s/p a fall.   Patient Stated Goals Return to neightborhood walking and hiking-increase activity.   Currently  in Pain? No/denies     THERAPEUTIC EXERCISE: Sidestepping with mini squat x 10 reps x 2 sets Standing mini squats Single limb mini squats with green t-band for resistance to end range (also facilitates tactile cues) Bilateral heel raise Single limb heel raise x 10 reps with UE support Standing L LE external rotation in R LE stance Terminal knee extension with green theraband Wall slides with pain distal knee added ball to squeeze for adduction x 8 reps Hamstring stretching  Gait: Stair negotiation reciprocal pattern with c/o pain with descending as patient initiates eccentric lowering between steps.  Ice pack x 10 minutes at end of session       PT Short Term Goals - 08/21/15 1151    PT SHORT TERM GOAL #1   Title The patient will improve R knee extension to -3 degrees (from 6 degrees) of extensor lag.   Baseline Target date 09/19/2015   Time 4   Period Weeks   Status New   PT SHORT TERM GOAL #2   Title The patient will negotiate 4 steps with reciprocal pattern and one handrail without increase in pain.   Baseline Target date 09/19/2015   Time 4   Period Weeks   Status New   PT SHORT TERM GOAL #3   Title Continue to progress HEP to patient tolerance.   Baseline Target date 09/19/2015   Time 4   Period Weeks  PT Long Term Goals - 08/21/15 1147    PT LONG TERM GOAL #1   Title The patient will report return to hiking/outdoor activities for improved mobility.   Baseline Target date 10/20/2015   Time 6   Period Weeks   Status New   PT LONG TERM GOAL #2   Title The patient will be able to negotiate steps ascending and descending without handrails x 4 steps x 3 reps without knee pain.   Baseline Target date 10/20/2015   Time 6   Period Weeks   Status New   PT LONG TERM GOAL #3   Title The patient will be indep with progression of HEP.   Baseline Target date 10/20/2015   Time 6   Period Weeks   Status New   PT LONG TERM GOAL #4   Title The patient will improve R  LE quad strength to 5/5 for improved functional knee control.   Baseline Target date 10/20/2015   Time 6   Period Weeks   Status On-going               Plan - 09/04/15 1550    Clinical Impression Statement PT emphasized continued HEP while on vacation and f/u with MD due to continued sensation of knee "giving way" when performing single leg stance activities with eccentric control at the knee.  Plan to check goals next visit.   PT Treatment/Interventions Cryotherapy;Electrical Stimulation;Moist Heat;Therapeutic exercise;Therapeutic activities;Functional mobility training;Gait training;Stair training;Neuromuscular re-education;Patient/family education;Manual techniques;Passive range of motion   PT Next Visit Plan terminal knee extension, training for improved eccentric quad control on stairs, strengthening/stability, treadmill/leg press. CHECK GOALs   Consulted and Agree with Plan of Care Patient      Patient will benefit from skilled therapeutic intervention in order to improve the following deficits and impairments:  Abnormal gait, Decreased range of motion, Difficulty walking, Pain, Decreased activity tolerance, Decreased strength, Increased edema, Impaired flexibility  Visit Diagnosis: Pain in right knee  Muscle weakness (generalized)     Problem List Patient Active Problem List   Diagnosis Date Noted  . Effusion of right knee 05/21/2015  . 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 Acute cholecystitis 12/04/2014  . Acute acalculous cholecystitis 12/04/2014  . Fibroid     Todd Argabright, PT 09/04/2015, 3:51 PM  [facility] [address] 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

## 2015-09-11 ENCOUNTER — Ambulatory Visit: Payer: 59 | Admitting: Rehabilitative and Restorative Service Providers"

## 2015-10-01 DIAGNOSIS — N3941 Urge incontinence: Secondary | ICD-10-CM | POA: Diagnosis not present

## 2015-10-01 DIAGNOSIS — R278 Other lack of coordination: Secondary | ICD-10-CM | POA: Diagnosis not present

## 2015-10-01 DIAGNOSIS — M6281 Muscle weakness (generalized): Secondary | ICD-10-CM | POA: Diagnosis not present

## 2015-10-01 DIAGNOSIS — N393 Stress incontinence (female) (male): Secondary | ICD-10-CM | POA: Diagnosis not present

## 2015-10-02 ENCOUNTER — Ambulatory Visit: Payer: 59 | Admitting: Rehabilitative and Restorative Service Providers"

## 2015-10-09 ENCOUNTER — Ambulatory Visit: Payer: 59 | Admitting: Rehabilitative and Restorative Service Providers"

## 2015-10-16 ENCOUNTER — Ambulatory Visit: Payer: 59 | Admitting: Rehabilitative and Restorative Service Providers"

## 2015-10-16 DIAGNOSIS — N393 Stress incontinence (female) (male): Secondary | ICD-10-CM | POA: Diagnosis not present

## 2015-10-16 DIAGNOSIS — R278 Other lack of coordination: Secondary | ICD-10-CM | POA: Diagnosis not present

## 2015-10-16 DIAGNOSIS — N3941 Urge incontinence: Secondary | ICD-10-CM | POA: Diagnosis not present

## 2015-10-16 DIAGNOSIS — M6281 Muscle weakness (generalized): Secondary | ICD-10-CM | POA: Diagnosis not present

## 2015-10-29 ENCOUNTER — Encounter: Payer: Self-pay | Admitting: Rehabilitative and Restorative Service Providers"

## 2015-10-29 NOTE — Therapy (Signed)
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 provider found  Encounter Date: last encounter 09/04/2015  PHYSICAL THERAPY DISCHARGE SUMMARY  Visits from Start of Care: 15  Current functional level related to goals / functional outcomes: PT updated patient goals 08/20/2015.  She was seen for 2 more visits and went on vacation.  Upon returning, she notes that she has joined a gym and is planning to continue her strengthening in community program.   See prior goals for patient status.    Remaining deficits: Decreased terminal knee extension noted during gait. Patient reports return to hiking, exercise, and regular walking.   Education / Equipment: HEP progression.   Plan: Patient agrees to discharge.  Patient goals were partially met. Patient is being discharged due to the patient's request.  ?????        Thank you for the referral of this patient. Rudell Cobb, MPT   Martha Vincent 10/29/2015, 11:39 AM  [facility] [address] [place], [place], [address] Phone: [phone]   Fax:  [phone]

## 2015-11-06 DIAGNOSIS — M6281 Muscle weakness (generalized): Secondary | ICD-10-CM | POA: Diagnosis not present

## 2015-11-06 DIAGNOSIS — R278 Other lack of coordination: Secondary | ICD-10-CM | POA: Diagnosis not present

## 2015-11-06 DIAGNOSIS — N393 Stress incontinence (female) (male): Secondary | ICD-10-CM | POA: Diagnosis not present

## 2015-11-20 DIAGNOSIS — R278 Other lack of coordination: Secondary | ICD-10-CM | POA: Diagnosis not present

## 2015-11-20 DIAGNOSIS — N393 Stress incontinence (female) (male): Secondary | ICD-10-CM | POA: Diagnosis not present

## 2015-11-20 DIAGNOSIS — M6281 Muscle weakness (generalized): Secondary | ICD-10-CM | POA: Diagnosis not present

## 2015-11-20 DIAGNOSIS — M62838 Other muscle spasm: Secondary | ICD-10-CM | POA: Diagnosis not present

## 2015-12-11 DIAGNOSIS — M6281 Muscle weakness (generalized): Secondary | ICD-10-CM | POA: Diagnosis not present

## 2015-12-11 DIAGNOSIS — R278 Other lack of coordination: Secondary | ICD-10-CM | POA: Diagnosis not present

## 2015-12-11 DIAGNOSIS — M62838 Other muscle spasm: Secondary | ICD-10-CM | POA: Diagnosis not present

## 2015-12-11 DIAGNOSIS — N393 Stress incontinence (female) (male): Secondary | ICD-10-CM | POA: Diagnosis not present

## 2015-12-17 ENCOUNTER — Telehealth: Payer: Self-pay | Admitting: *Deleted

## 2015-12-17 NOTE — Telephone Encounter (Signed)
Pt called asked if you would be willing to prescribe HRT, would like estrogen/progesterone combination patch , prefers not to take pills, states has had estrogen patch in past and done well with this and believes it will help her sleep better as well. Or would you prefer office visit to discuss? Please advise

## 2015-12-21 ENCOUNTER — Other Ambulatory Visit: Payer: Self-pay | Admitting: *Deleted

## 2015-12-21 ENCOUNTER — Telehealth: Payer: Self-pay | Admitting: Gynecology

## 2015-12-21 MED ORDER — PROGESTERONE MICRONIZED 100 MG PO CAPS: 100.0000 mg | ORAL_CAPSULE | Freq: Every day | ORAL | 11 refills | Status: DC

## 2015-12-21 MED ORDER — ESTRADIOL 0.5 MG PO TABS: 0.5000 mg | ORAL_TABLET | Freq: Every day | ORAL | 11 refills | Status: DC

## 2015-12-21 MED FILL — ESTRADIOL 0.5 MG TABLET: 0.5 | 30 days supply | Qty: 30 | Fill #0

## 2015-12-21 MED FILL — PROGESTERONE 100 MG CAPSULE: 100 | 30 days supply | Qty: 30 | Fill #0

## 2015-12-21 NOTE — Telephone Encounter (Signed)
Patient calls questioning about whether she wants to start on hormone replacement therapy.  She is starting to have some mild menopausal symptoms primarily with sleep disturbance. I reviewed the most current NAMS 2017 HRT guidelines. Benefits as far as symptom relief, possible early initiation with cardiovascular bone health as well as risks to include increased risk of stroke heart attack DVT and breast cancer. She does have some history of migraines and is aware that this could exacerbate her symptoms. She's done no bleeding since removed her IUD earlier this year in April. Benefits of transdermal from a first pass effect also reviewed. After a discussion the patient would prefer to go on oral therapy. Will go with estradiol 0.5 mg daily and Prometrium 100 mg at nighttime we'll see how we do with this. If she has any issues once starting this or does a bleeding she knows to call me.

## 2015-12-21 NOTE — Telephone Encounter (Signed)
error 

## 2015-12-21 NOTE — Telephone Encounter (Signed)
Dr.Pedley said you may reach her on cell [phone] during 1pm-2pm if needed.

## 2015-12-25 DIAGNOSIS — R278 Other lack of coordination: Secondary | ICD-10-CM | POA: Diagnosis not present

## 2015-12-25 DIAGNOSIS — M62838 Other muscle spasm: Secondary | ICD-10-CM | POA: Diagnosis not present

## 2015-12-25 DIAGNOSIS — M6281 Muscle weakness (generalized): Secondary | ICD-10-CM | POA: Diagnosis not present

## 2015-12-25 DIAGNOSIS — N3941 Urge incontinence: Secondary | ICD-10-CM | POA: Diagnosis not present

## 2015-12-28 DIAGNOSIS — M542 Cervicalgia: Secondary | ICD-10-CM | POA: Insufficient documentation

## 2016-01-13 ENCOUNTER — Telehealth: Payer: Self-pay | Admitting: *Deleted

## 2016-01-13 NOTE — Telephone Encounter (Signed)
Okay for referral to urology. Refill her estradiol/Prometrium through her next annual exam if needed.

## 2016-01-13 NOTE — Telephone Encounter (Signed)
Pt called to let you know the HRT is working well. She asked if could refer her to [facility] urology for pelvic floor therapy,was referred there by Dr.Catherine Zigmund Daniel sometime ago and had several sessions and done well, she is doing all the exercises, but still feels the need to go back to therapy. Please advise

## 2016-01-13 NOTE — Telephone Encounter (Signed)
Pt has 1 year worth of HRT, appointment [facility] is on 01/15/16 @ 8:00 am. Pt aware with time and date.

## 2016-01-29 DIAGNOSIS — R262 Difficulty in walking, not elsewhere classified: Secondary | ICD-10-CM | POA: Diagnosis not present

## 2016-01-29 DIAGNOSIS — M62838 Other muscle spasm: Secondary | ICD-10-CM | POA: Diagnosis not present

## 2016-01-29 DIAGNOSIS — M6281 Muscle weakness (generalized): Secondary | ICD-10-CM | POA: Diagnosis not present

## 2016-01-29 DIAGNOSIS — M25561 Pain in right knee: Secondary | ICD-10-CM | POA: Diagnosis not present

## 2016-01-29 MED FILL — PROGESTERONE 100 MG CAPSULE: 100 | 30 days supply | Qty: 30 | Fill #1

## 2016-01-29 MED FILL — ESTRADIOL 0.5 MG TABLET: 0.5 | 30 days supply | Qty: 30 | Fill #1

## 2016-02-05 DIAGNOSIS — R262 Difficulty in walking, not elsewhere classified: Secondary | ICD-10-CM | POA: Diagnosis not present

## 2016-02-05 DIAGNOSIS — M6281 Muscle weakness (generalized): Secondary | ICD-10-CM | POA: Diagnosis not present

## 2016-02-05 DIAGNOSIS — M25561 Pain in right knee: Secondary | ICD-10-CM | POA: Diagnosis not present

## 2016-02-05 DIAGNOSIS — M62838 Other muscle spasm: Secondary | ICD-10-CM | POA: Diagnosis not present

## 2016-02-19 DIAGNOSIS — M25561 Pain in right knee: Secondary | ICD-10-CM | POA: Diagnosis not present

## 2016-02-19 DIAGNOSIS — M62838 Other muscle spasm: Secondary | ICD-10-CM | POA: Diagnosis not present

## 2016-02-19 DIAGNOSIS — R262 Difficulty in walking, not elsewhere classified: Secondary | ICD-10-CM | POA: Diagnosis not present

## 2016-02-19 DIAGNOSIS — M6281 Muscle weakness (generalized): Secondary | ICD-10-CM | POA: Diagnosis not present

## 2016-02-23 DIAGNOSIS — R3121 Asymptomatic microscopic hematuria: Secondary | ICD-10-CM | POA: Insufficient documentation

## 2016-02-26 DIAGNOSIS — M6281 Muscle weakness (generalized): Secondary | ICD-10-CM | POA: Diagnosis not present

## 2016-02-26 DIAGNOSIS — M25561 Pain in right knee: Secondary | ICD-10-CM | POA: Diagnosis not present

## 2016-02-26 DIAGNOSIS — M62838 Other muscle spasm: Secondary | ICD-10-CM | POA: Diagnosis not present

## 2016-03-25 MED FILL — ESTRADIOL 0.5 MG TABLET: 0.5 | 30 days supply | Qty: 30 | Fill #2

## 2016-03-25 MED FILL — PROGESTERONE 100 MG CAPSULE: 100 | 30 days supply | Qty: 30 | Fill #2

## 2016-03-25 MED FILL — EZETIMIBE 10 MG TABLET: 10 | 90 days supply | Qty: 90 | Fill #0

## 2016-05-17 MED FILL — ESTRADIOL 0.5 MG TABLET: 0.5 | 30 days supply | Qty: 30 | Fill #3

## 2016-05-19 ENCOUNTER — Encounter: Payer: Self-pay | Admitting: Sports Medicine

## 2016-05-19 ENCOUNTER — Ambulatory Visit: Payer: Self-pay

## 2016-05-19 ENCOUNTER — Other Ambulatory Visit: Payer: Self-pay | Admitting: *Deleted

## 2016-05-19 ENCOUNTER — Ambulatory Visit (INDEPENDENT_AMBULATORY_CARE_PROVIDER_SITE_OTHER): Payer: 59 | Admitting: Sports Medicine

## 2016-05-19 VITALS — BP 133/73 | HR 61 | Ht 70.0 in | Wt 238.0 lb

## 2016-05-19 DIAGNOSIS — M25561 Pain in right knee: Secondary | ICD-10-CM

## 2016-05-19 DIAGNOSIS — M23306 Other meniscus derangements, unspecified meniscus, right knee: Secondary | ICD-10-CM

## 2016-05-19 NOTE — Progress Notes (Signed)
RT knee pain  Patient had arthroscipy of this knee one year ago Since then the medial joint line pain resolved However, she continues to have deep knee pain Feels this anterior Grinding sensation in knee Difficult to straighten knee  Doing more home exercise  ROS No locking Periodic swelling No giving way  PE PLEASANT F IN NAD BP 133/73   Pulse 61   Ht 5\' 10"  (1.778 m)   Wt 238 lb (108 kg)   BMI 34.15 kg/m   Knee: Normal to inspection with no erythema  or obvious bony abnormalities. Puffy but ? effusion Palpation normal with no warmth or joint line tenderness + for  patellar crepitation RT > LT ROM normal in flexion with slight limitatino in extension  Ligaments with solid consistent endpoints including ACL, PCL, LCL, MCL. Negative Mcmurray's and provocative meniscal tests. + painful patellar compression.on shake home Patellar and quadriceps tendons unremarkable. Hamstring and quadriceps strength is normal.  Ultrasound of Right Knee  Suprapatellar pouch shows small effusion only Lots of calcification in trochlear groove Some cartilage thinning behind patella Lateral meniscus OK Medial meniscus - absent and thinned in mid joint line Patellar and quadriceps tendon normal  Impression: Ultrasound consistent with calcifications and possible loose bodies in trochlear groove; Loss of meniscus in midline medial joint line  Ultrasound and interpretation by Wolfgang Phoenix. Oneida Alar, MD

## 2016-05-19 NOTE — Assessment & Plan Note (Signed)
S/P arthroscipy This does not seem TTP and minimal swelling on [place]

## 2016-05-19 NOTE — Assessment & Plan Note (Signed)
Try chopat strap  Work on full extension  Series of SLR exercises  REck prn

## 2016-05-20 MED FILL — PROGESTERONE 100 MG CAPSULE: 100 | 30 days supply | Qty: 30 | Fill #3

## 2016-06-16 MED FILL — ATOVAQUONE-PROGUANIL 250-10: 250-100 | 19 days supply | Qty: 19 | Fill #0

## 2016-06-16 MED FILL — AZITHROMYCIN 500 MG TABLET: 500 | 10 days supply | Qty: 10 | Fill #0

## 2016-06-16 MED FILL — PROGESTERONE 100 MG CAPSULE: 100 | 30 days supply | Qty: 30 | Fill #4

## 2016-06-16 MED FILL — ESTRADIOL 0.5 MG TABLET: 0.5 | 30 days supply | Qty: 30 | Fill #4

## 2016-07-15 ENCOUNTER — Encounter: Payer: Self-pay | Admitting: Internal Medicine

## 2016-07-15 ENCOUNTER — Ambulatory Visit (INDEPENDENT_AMBULATORY_CARE_PROVIDER_SITE_OTHER): Payer: 59 | Admitting: Internal Medicine

## 2016-07-15 VITALS — BP 155/80 | HR 72 | Temp 97.4°F

## 2016-07-15 DIAGNOSIS — T8069XA Other serum reaction due to other serum, initial encounter: Secondary | ICD-10-CM | POA: Diagnosis not present

## 2016-07-15 DIAGNOSIS — W5501XD Bitten by cat, subsequent encounter: Secondary | ICD-10-CM | POA: Diagnosis not present

## 2016-07-15 DIAGNOSIS — Z23 Encounter for immunization: Secondary | ICD-10-CM

## 2016-07-15 DIAGNOSIS — Z203 Contact with and (suspected) exposure to rabies: Secondary | ICD-10-CM

## 2016-07-15 NOTE — Progress Notes (Signed)
RFV: post exposure rabies  Patient ID: Martha Vincent, Dr., female   DOB: October 11, 1965, 51 y.o.   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 Left 4/15th [place] -> [place], [place]. Slept in rustic home the first night, then [place] and mosquito net and slept in hammock. 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 Slept on boat.  Then [place] but now has more mosquito exposure. She Was on medical mission looking at rural clinics and saw access to renal replacement therapy for her husband's purpose of the trip. She was now staying in hotel but on Sunday at 9pm 11/22 while At hotel pool, she noticed cats and bats in the area. A small cat bite her right hand and numerous scratches at wrist. The following day she sought medical care on 4/23 had serum treatment, injected into hand, forearm and bilateral shoulder and axillary lymphatic injection of IRIG. 4/24 had first rabies Post exposure prophylaxis. She returned today from her flight to [place] to have further care   I have reviewed her records in epic and reviewed her pre travel vaccines and documentation from care received in [place].  4/23 anti rabies serum - right arm 4/24 1 dose of rabies vaccine- left arm  She denies having any aches, fever, chills, nightsweats headache. Though she has faint rash to her right arm.  Her initial cat bite site is well healed. She did not need to receive any antibiotics  She also has numerous insect bites around her ankles and legs but none appear to be infected  Outpatient Encounter Prescriptions as of 07/15/2016  Medication Sig  . Cholecalciferol (VITAMIN D PO) Take by mouth.  . diphenhydrAMINE (BENADRYL) 25 MG tablet Take by mouth.  . estradiol (ESTRACE) 0.5 MG tablet Take 1 tablet (0.5 mg total) by mouth daily.  Marland Kitchen ezetimibe (ZETIA) 10 MG tablet   . ibuprofen  (ADVIL,MOTRIN) 200 MG tablet Take 200 mg by mouth every 6 (six) hours as needed for headache or mild pain. Reported on 06/12/2015  . Melatonin 10 MG TABS Take 10 mg by mouth as needed (for sleep). Reported on 06/12/2015  . NONFORMULARY OR COMPOUNDED ITEM testosterone 2% cream apply small amount to periclitoral area  . progesterone (PROMETRIUM) 100 MG capsule Take 1 capsule (100 mg total) by mouth at bedtime.   No facility-administered encounter medications on file as of 07/15/2016.      Patient Active Problem List   Diagnosis Date Noted  . Anterior knee pain, right 05/21/2015  . 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 Acute cholecystitis 12/04/2014  . Acute acalculous cholecystitis 12/04/2014  . Fibroid      Health Maintenance Due  Topic Date Due  . HIV Screening  08/13/1979  . TETANUS/TDAP  08/13/1983  . COLONOSCOPY  08/13/2014    Social History  Substance Use Topics  . Smoking status: Never Smoker  . Smokeless tobacco: Never Used  . Alcohol use 2.4 oz/week    4 Standard drinks or equivalent per week   family history includes Cleft lip in her brother; Melanoma in her father. Review of Systems Review of Systems  Constitutional: + fatigue from traveling.Negative for fever, chills, diaphoresis, activity change, appetite change, fatigue and unexpected weight change.  HENT: Negative for congestion, sore throat, rhinorrhea, sneezing, trouble swallowing and sinus pressure.  Eyes: Negative for photophobia and visual disturbance.  Respiratory: Negative for cough, chest tightness, shortness of breath, wheezing and stridor.  Cardiovascular: Negative for chest pain, palpitations and leg swelling.  Gastrointestinal: Negative for nausea, vomiting, abdominal pain, diarrhea, constipation, blood in stool, abdominal distention and anal bleeding.  Genitourinary: Negative for dysuria, hematuria, flank pain and difficulty urinating.  Musculoskeletal: Negative for myalgias, back pain,  joint swelling, arthralgias and gait problem.  Skin:+ rash to right arm. Negative for color change, pallor, rash and wound.  Neurological: Negative for dizziness, tremors, weakness and light-headedness.  Hematological: Negative for adenopathy. Does not bruise/bleed easily.  Psychiatric/Behavioral: Negative for behavioral problems, confusion, sleep disturbance, dysphoric mood, decreased concentration and agitation.     Physical Exam   BP (!) 155/80   Pulse 72   Temp 97.4 F (36.3 C) (Oral)  Physical Exam  Constitutional:  oriented to person, place, and time. appears well-developed and well-nourished. No distress.  HENT: [place]/AT, PERRLA, no scleral icterus Mouth/Throat: Oropharynx is clear and moist. No oropharyngeal exudate.   Neck = supple, no nuchal rigidity Lymphadenopathy: no cervical adenopathy. No axillary adenopathy Neurological: alert and oriented to person, place, and time.  Skin: faint maculopapular rash, blanching to right dorsum of hand, forearm - unilateral Psychiatric: a normal mood and affect.  behavior is normal.   CBC Lab Results  Component Value Date   WBC 8.6 12/03/2014   RBC 4.55 12/03/2014   HGB 13.6 05/25/2015   HCT 41.6 12/03/2014   PLT 278 12/03/2014   MCV 91.4 12/03/2014   MCH 30.1 12/03/2014   MCHC 32.9 12/03/2014   RDW 13.1 12/03/2014   LYMPHSABS 3.6 01/02/2013   MONOABS 0.8 01/02/2013   EOSABS 0.2 01/02/2013    BMET Lab Results  Component Value Date   NA 138 12/04/2014   K 4.1 12/04/2014   CL 109 12/04/2014   CO2 24 12/04/2014   GLUCOSE 107 (H) 12/04/2014   BUN 10 12/04/2014   CREATININE 0.78 12/04/2014   CALCIUM 8.4 (L) 12/04/2014   GFRNONAA >60 12/04/2014   GFRAA >60 12/04/2014    Assessment and Plan  51yo F who sustained a Cat/Kitten Bite with scratches, unknown status for rabies. Patient started treatment for rabies post exposure in [place] and seeking care to finish out treatment course.Prompt rabies PEP combining wound care,  infiltration of RIG into and around the wound, and multiple doses of rabies cell-culture vaccine continue to be highly effective in preventing human rabies.  Post exposure rabies: Based on her exposure and receiving IVIG(4/23) plus 1st dose of rabies vaccine(4/24). We will give her her day #3 vaccine today. She will get day #7 on may 1st and day #14 on may 8th to finish the post exposure prophylaxis  Right arm rash = possibly side effect for IRIG, continue to monitor. Use prn ibuprofen  Cat bite lesion = well healed no need for abtx at this time

## 2016-07-18 NOTE — Addendum Note (Signed)
Addended by: Landis Gandy on: 07/18/2016 10:17 AM   Modules accepted: Orders

## 2016-07-19 ENCOUNTER — Ambulatory Visit (INDEPENDENT_AMBULATORY_CARE_PROVIDER_SITE_OTHER): Payer: 59 | Admitting: *Deleted

## 2016-07-19 DIAGNOSIS — Z203 Contact with and (suspected) exposure to rabies: Secondary | ICD-10-CM

## 2016-07-19 DIAGNOSIS — W5501XD Bitten by cat, subsequent encounter: Secondary | ICD-10-CM | POA: Diagnosis not present

## 2016-07-19 DIAGNOSIS — Z23 Encounter for immunization: Secondary | ICD-10-CM

## 2016-07-19 NOTE — Progress Notes (Signed)
Patient presented with 2 different rashes.  One, per patient, was unchanged from last Friday.  This was a flat, light pink rash on her right hand, wrist, forearm, and lower bicep.  She reports it has not spread or increased since last inspected by Dr Baxter Flattery. It is not itchy, is not hot to the touch.   The second rash appears like bug bites on lower half of patient's body, front and back, from her bra line to her toes. There are larger clusters around her ankles.  She describes these as itchy, but not painful.  They appeared over the weekend.  She is concerned that she may have picked up a secondary illness after the cat bite. She denies fever, pain, lymphedema.  She took benadryl with some relief.  She will look for evidence of insects at home, just in case.  Rashes and patient's concerns discussed with Dr Baxter Flattery.  Per Dr. Baxter Flattery, ok to proceed with Rabies immunization as scheduled.  She advised to continue ibuprofen per directions for serum sickness, will reach out to patient personally. Landis Gandy, RN

## 2016-07-26 ENCOUNTER — Ambulatory Visit (INDEPENDENT_AMBULATORY_CARE_PROVIDER_SITE_OTHER): Payer: 59 | Admitting: *Deleted

## 2016-07-26 DIAGNOSIS — W5501XD Bitten by cat, subsequent encounter: Secondary | ICD-10-CM

## 2016-07-26 DIAGNOSIS — Z203 Contact with and (suspected) exposure to rabies: Secondary | ICD-10-CM | POA: Diagnosis not present

## 2016-07-26 DIAGNOSIS — Z23 Encounter for immunization: Secondary | ICD-10-CM

## 2016-08-08 ENCOUNTER — Encounter: Payer: 59 | Admitting: Gynecology

## 2016-08-22 MED FILL — PROGESTERONE 100 MG CAPSULE: 100 | 30 days supply | Qty: 30 | Fill #5

## 2016-08-26 MED FILL — ESTRADIOL 0.5 MG TABLET: 0.5 | 30 days supply | Qty: 30 | Fill #5

## 2016-11-14 ENCOUNTER — Other Ambulatory Visit: Payer: Self-pay | Admitting: Neurology

## 2016-11-14 DIAGNOSIS — F902 Attention-deficit hyperactivity disorder, combined type: Secondary | ICD-10-CM

## 2016-11-14 MED ORDER — AMPHETAMINE-DEXTROAMPHET ER 30 MG PO CP24: 30.0000 mg | ORAL_CAPSULE | Freq: Every day | ORAL | 0 refills | Status: DC

## 2016-11-23 MED FILL — ESTRADIOL 0.5 MG TABLET: 0.5 | 30 days supply | Qty: 30 | Fill #6

## 2016-11-23 MED FILL — PROGESTERONE 100 MG CAPSULE: 100 | 30 days supply | Qty: 30 | Fill #6

## 2017-01-04 ENCOUNTER — Ambulatory Visit (INDEPENDENT_AMBULATORY_CARE_PROVIDER_SITE_OTHER): Payer: 59 | Admitting: Neurology

## 2017-01-04 DIAGNOSIS — F902 Attention-deficit hyperactivity disorder, combined type: Secondary | ICD-10-CM

## 2017-01-04 DIAGNOSIS — G8929 Other chronic pain: Secondary | ICD-10-CM | POA: Diagnosis not present

## 2017-01-04 DIAGNOSIS — F900 Attention-deficit hyperactivity disorder, predominantly inattentive type: Secondary | ICD-10-CM | POA: Diagnosis not present

## 2017-01-04 DIAGNOSIS — G5602 Carpal tunnel syndrome, left upper limb: Secondary | ICD-10-CM

## 2017-01-04 DIAGNOSIS — M542 Cervicalgia: Secondary | ICD-10-CM

## 2017-01-04 MED ORDER — AMPHETAMINE-DEXTROAMPHET ER 30 MG PO CP24: 30.0000 mg | ORAL_CAPSULE | Freq: Every day | ORAL | 0 refills | Status: DC

## 2017-01-05 MED FILL — ADDERALL XR 30 MG CAP SA: 30 | 30 days supply | Qty: 30 | Fill #0

## 2017-01-06 DIAGNOSIS — G5600 Carpal tunnel syndrome, unspecified upper limb: Secondary | ICD-10-CM | POA: Insufficient documentation

## 2017-01-06 DIAGNOSIS — F909 Attention-deficit hyperactivity disorder, unspecified type: Secondary | ICD-10-CM | POA: Insufficient documentation

## 2017-01-06 NOTE — Progress Notes (Signed)
See procedure note.

## 2017-01-06 NOTE — Progress Notes (Signed)
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 Joylene Draft for CTS and ADHD. Patient has a PMHx of long standing ADHD treated with Adderall. She was diagnosed with ADHD many years ago and has been on daily medication with good control. She is primarily inattentive type. Records reviewed previous physicians consistent with ADHD. She has pain in the hands, tingling with weakness in the left > right. She has neck pain which has been longstanding and has been to PT with medical management. No other focal neurologic deficits, associated symptoms, inciting events or modifiable factors.  Reviewed notes, labs and imaging from outside physicians, which showed:  Clinical Data:   Severe right arm pain extending to right fourth and fifth finger.  No known injury.  Woke up this way.   MRI CERVICAL SPINE WITHOUT CONTRAST: Comparison:   None. Cervicomedullary junction and visualized intracranial structures are grossly within normal limits.  Top normal size high jugulodigastric lymph nodes, with short axis diameter less than 1 cm.  The cervical spinal cord is of normal caliber without focal signal abnormality.  There is a flow void within both vertebral arteries.   C2-3:  No significant spinal stenosis or foraminal narrowing.  Minimal facet joint degenerative changes.   C3-4:  Minimal bulge.  Minimal facet joint degenerative changes.  Minimal left-sided uncovertebral joint degenerative changes and minimal left-sided foraminal narrowing.  C4-5:  Mild left-sided facet joint degenerative changes.  Minimal left-sided foraminal narrowing.  Minimal bulge, slightly greater towards the left.   C5-6:  Small to moderate sized broad-based central protrusion causes mild spinal stenosis.  Additionally, there is a small left posterior  lateral cephalad extending extruded disk elevating the posterior longitudinal ligament causing slight impression upon the left ventral aspect of the thecal sac near the left C6 nerve root.  Minimal facet joint degenerative changes and minimal right-sided foraminal narrowing. C6-7:  Moderate to large sized broad-based right posterior lateral disk protrusion with impression upon the thecal sac and deformity of the right aspect of the cord as well as mass effect upon the right C7 nerve root.  Slight narrowing of the origin of the right C6-7 foramen.  Minimal bilateral facet joint degenerative changes. C7-T1:  Minimal bilateral facet joint degenerative changes.   IMPRESSION: 1.  The most significant finding on present examination is the moderate to large-sized right posterior lateral C6-7 disk protrusion and associated mass effect as described above.   2.  C5-6 disk protrusion has two components; broad-based central component with mild spinal stenosis with a small extruded cephalad extending component left posterior lateral position, elevating the posterior longitudinal ligament and causing slight impression upon the left ventral aspect of the thecal sac near the left C6 nerve root  CBC, CP unremarkable  Review of Systems: Patient complains of symptoms per HPI as well as the following symptoms:no CP, no SOB.  Pertinent negatives and positives per HPI. All others negative.   Social History   Social History  . Marital status: Married    Spouse name: N/A  . Number of children: N/A  . Years of education: N/A   Occupational History  . Not on file.   Social History Main Topics  . Smoking status: Never Smoker  . Smokeless tobacco: Never Used  . Alcohol use 2.4 oz/week  4 Standard drinks or equivalent per week  . Drug use: No  . Sexual activity: Yes    Birth control/ protection: IUD     Comment: Mirena inserted 12-08-09-1st intercourse 51 yo-Fewer than 5 partners   Other Topics Concern  . Not  on file   Social History Narrative   ** Merged History Encounter **        Family History  Problem Relation Age of Onset  . Cleft lip Brother   . Melanoma Father     Past Medical History:  Diagnosis Date  . Chondromalacia of knee    LEFT COMPARTMENT  . History of cervical dysplasia   . History of gestational diabetes   . History of uterine fibroid     Past Surgical History:  Procedure Laterality Date  . CESAREAN SECTION  X2  11-16-2005  . CHOLECYSTECTOMY N/A 12/04/2014   Procedure: LAPAROSCOPIC CHOLECYSTECTOMY;  Surgeon: Ralene Ok, MD;  Location: [facility];  Service: General;  Laterality: N/A;  . COLPOSCOPY    . GYNECOLOGIC CRYOSURGERY  age 15  . KNEE ARTHROSCOPY Right 05/25/2015   Procedure: RIGHT KNEE ARTHROSCOPY;  Surgeon: Paralee Cancel, MD;  Location: [facility];  Service: Orthopedics;  Laterality: Right;  . MYOMECTOMY ABDOMINAL APPROACH  12-29-2003  . NEGATIVE SLEEP STUDY  2015    Current Outpatient Prescriptions  Medication Sig Dispense Refill  . amphetamine-dextroamphetamine (ADDERALL XR) 30 MG 24 hr capsule Take 1 capsule (30 mg total) by mouth daily. 30 capsule 0  . Cholecalciferol (VITAMIN D PO) Take by mouth.    . diphenhydrAMINE (BENADRYL) 25 MG tablet Take by mouth.    . estradiol (ESTRACE) 0.5 MG tablet Take 1 tablet (0.5 mg total) by mouth daily. 30 tablet 11  . ezetimibe (ZETIA) 10 MG tablet   3  . ibuprofen (ADVIL,MOTRIN) 200 MG tablet Take 200 mg by mouth every 6 (six) hours as needed for headache or mild pain. Reported on 06/12/2015    . Melatonin 10 MG TABS Take 10 mg by mouth as needed (for sleep). Reported on 06/12/2015    . NONFORMULARY OR COMPOUNDED ITEM testosterone 2% cream apply small amount to periclitoral area 90 each 3  . progesterone (PROMETRIUM) 100 MG capsule Take 1 capsule (100 mg total) by mouth at bedtime. 30 capsule 11   No current facility-administered medications for this visit.     Allergies as of 01/04/2017 -  Review Complete 07/15/2016  Allergen Reaction Noted  . Adhesive [tape] Other (See Comments) 05/20/2015  . Bactrim [sulfamethoxazole-trimethoprim] Other (See Comments) 12/03/2014  . Coconut flavor Nausea And Vomiting 08/07/2011  . Sulfa antibiotics Other (See Comments) 12/03/2014  . Erythromycin Other (See Comments) 05/21/2015    Vitals: There were no vitals taken for this visit. Last Weight:  Wt Readings from Last 1 Encounters:  05/19/16 238 lb (108 kg)   Last Height:   Ht Readings from Last 1 Encounters:  05/19/16 5\' 10"  (1.778 m)        Physical exam: Exam: Gen: NAD, conversant, well nourised, obese, well groomed                     CV: RRR, no MRG. No Carotid Bruits. No peripheral edema, warm, nontender Eyes: Conjunctivae clear without exudates or hemorrhage  Neuro: Detailed Neurologic Exam  Speech:    Speech is normal; fluent and spontaneous with normal comprehension.  Cognition:    The patient is oriented to person, place, and time;  recent and remote memory intact;     language fluent;     normal attention, concentration,     fund of knowledge Cranial Nerves:    The pupils are equal, round, and reactive to light. The fundi are normal and spontaneous venous pulsations are present. Visual fields are full to finger confrontation. Extraocular movements are intact. Trigeminal sensation is intact and the muscles of mastication are normal. The face is symmetric. The palate elevates in the midline. Hearing intact. Voice is normal. Shoulder shrug is normal. The tongue has normal motion without fasciculations.   Coordination:    Normal finger to nose and heel to shin. Normal rapid alternating movements.   Gait:    Heel-toe and tandem gait are normal.   Motor Observation:    No asymmetry, no atrophy, and no involuntary movements noted. Tone:    Normal muscle tone.    Posture:    Posture is normal. normal erect    Strength:    Strength is V/V in the upper and  lower limbs.      Sensation: intact to LT     Reflex Exam:  DTR's:    Deep tendon reflexes in the upper and lower extremities are normal bilaterally.   Toes:    The toes are downgoing bilaterally.   Clonus:    Clonus is absent.  Assessment/Plan:  51 year old female with CTS, chronic neck pain, ADHD  - Continue Adderall daily - emg/ncs for CTS eval. Use supportive management. Consider repeat MRI cervical.   Orders Placed This Encounter  Procedures  . NCV with EMG(electromyography)    Sarina Ill, MD  [facility] [address] [place], [place] [address]  Phone [phone] Fax [phone]

## 2017-01-06 NOTE — Procedures (Signed)
Full Name: Martha Vincent Gender: Female MRN #: [id number] Date of Birth: 07-22-1965    Visit Date: 01/04/2017 10:13 Age: 51 Years 1 Months Old Examining Physician: Sarina Ill, MD  Referring Physician: Jaynee Eagles, MD  History: Patient with likely left CTS  Summary: The left Median Orthodromic Sensory nerve showed delayed distal peak latency(3.32ms, N<3.4). The left median/ulnar (palm) comparison nerve showed prolonged Distal peak latency (Median Palm, 2.4 ms, N<2.2) and abnormal peak latency difference (Median Palm-Ulnar Palm, 0.6 ms, N<0.4) with a relative median delay.     Conclusion: There is mild left Carpal Tunnel Syndrome.   Sarina Ill M.D.  [facility] [place], [place] [address] Tel: [phone] Fax: [phone]        [facility]    Nerve / Sites Muscle Latency Ref. Amplitude Ref. Rel Amp Segments Distance Velocity Ref. Area    ms ms mV mV %  cm m/s m/s mVms  R Median - APB     Wrist APB 3.5 ?4.4 8.0 ?4.0 100 Wrist - APB 7   21.5     Upper arm APB 7.4  7.5  93 Upper arm - Wrist 22 56 ?49 17.6  L Median - APB     Wrist APB 3.9 ?4.4 6.3 ?4.0 100 Wrist - APB 7   20.4     Upper arm APB 7.6  6.0  95.9 Upper arm - Wrist 22 59 ?49 20.4  R Ulnar - ADM     Wrist ADM 2.6 ?3.3 10.0 ?6.0 100 Wrist - ADM 7   29.2     B.Elbow ADM 6.2  9.1  91.5 B.Elbow - Wrist 20 56 ?49 27.3     A.Elbow ADM 8.0  8.2  89.3 A.Elbow - B.Elbow 10 55 ?49 24.5         A.Elbow - Wrist      L Ulnar - ADM     Wrist ADM 2.9 ?3.3 9.6 ?6.0 100 Wrist - ADM 7   34.1     B.Elbow ADM 6.5  8.0  83.2 B.Elbow - Wrist 20 56 ?49 28.6     A.Elbow ADM 8.2  8.8  110 A.Elbow - B.Elbow 10 58 ?49 31.2         A.Elbow - Wrist                 SNC    Nerve / Sites Rec. Site Peak Lat Ref.  Amp Ref. Segments Distance Peak Diff Ref.    ms ms V V  cm ms ms  R Median, Ulnar - Transcarpal comparison     Median Palm Wrist 2.1 ?2.2 39 ?35 Median Palm - Wrist 8       Ulnar Palm Wrist 2.0  ?2.2 16 ?12 Ulnar Palm - Wrist 8          Median Palm - Ulnar Palm  0.1 ?0.4  L Median, Ulnar - Transcarpal comparison     Median Palm Wrist 2.4 ?2.2 61 ?35 Median Palm - Wrist 8       Ulnar Palm Wrist 1.9 ?2.2 27 ?12 Ulnar Palm - Wrist 8          Median Palm - Ulnar Palm  0.6 ?0.4  R Median - Orthodromic (Dig II, Mid palm)     Dig II Wrist 2.9 ?3.4 20 ?10 Dig II - Wrist 13    L Median - Orthodromic (Dig II, Mid palm)     Dig  II Wrist 3.5 ?3.4 26 ?10 Dig II - Wrist 13    R Ulnar - Orthodromic, (Dig V, Mid palm)     Dig V Wrist 2.6 ?3.1 12 ?5 Dig V - Wrist 11    L Ulnar - Orthodromic, (Dig V, Mid palm)     Dig V Wrist 2.9 ?3.1 15 ?5 Dig V - Wrist 48                   F  Wave    Nerve F Lat Ref.   ms ms  R Ulnar - ADM 29.7 ?32.0  L Ulnar - ADM 29.0 ?32.0        EMG FULL  EMG Summary Table    Spontaneous MUAP Recruitment  Muscle IA Fib PSW Fasc Other Amp Dur. Poly Pattern  Bilat. Deltoid Normal None None None _______ Normal Normal Normal Normal  Bilat. Triceps brachii Normal None None None _______ Normal Normal Normal Normal  Bilat. Pronator teres Normal None None None _______ Normal Normal Normal Normal  Bilat. Opponens pollicis Normal None None None _______ Normal Normal Normal Normal  Bilat. First dorsal interosseous Normal None None None _______ Normal Normal Normal Normal  Bilat. Cervical paraspinals (low) Normal None None None _______ Normal Normal Normal Normal

## 2017-01-06 NOTE — Progress Notes (Signed)
Full Name: Martha Vincent Gender: Female MRN #: [id number] Date of Birth: 23-May-1965    Visit Date: 01/04/2017 10:13 Age: 51 Years 27 Months Old Examining Physician: Sarina Ill, MD  Referring Physician: Jaynee Eagles, MD  History: Patient with likely left CTS  Summary: The left Median Orthodromic Sensory nerve showed delayed distal peak latency(3.4ms, N<3.4). The left median/ulnar (palm) comparison nerve showed prolonged Distal peak latency (Median Palm, 2.4 ms, N<2.2) and abnormal peak latency difference (Median Palm-Ulnar Palm, 0.6 ms, N<0.4) with a relative median delay.     Conclusion: There is mild left Carpal Tunnel Syndrome.   Sarina Ill M.D.  [facility] [place], [place] [address] Tel: [phone] Fax: [phone]        [facility]    Nerve / Sites Muscle Latency Ref. Amplitude Ref. Rel Amp Segments Distance Velocity Ref. Area    ms ms mV mV %  cm m/s m/s mVms  R Median - APB     Wrist APB 3.5 ?4.4 8.0 ?4.0 100 Wrist - APB 7   21.5     Upper arm APB 7.4  7.5  93 Upper arm - Wrist 22 56 ?49 17.6  L Median - APB     Wrist APB 3.9 ?4.4 6.3 ?4.0 100 Wrist - APB 7   20.4     Upper arm APB 7.6  6.0  95.9 Upper arm - Wrist 22 59 ?49 20.4  R Ulnar - ADM     Wrist ADM 2.6 ?3.3 10.0 ?6.0 100 Wrist - ADM 7   29.2     B.Elbow ADM 6.2  9.1  91.5 B.Elbow - Wrist 20 56 ?49 27.3     A.Elbow ADM 8.0  8.2  89.3 A.Elbow - B.Elbow 10 55 ?49 24.5         A.Elbow - Wrist      L Ulnar - ADM     Wrist ADM 2.9 ?3.3 9.6 ?6.0 100 Wrist - ADM 7   34.1     B.Elbow ADM 6.5  8.0  83.2 B.Elbow - Wrist 20 56 ?49 28.6     A.Elbow ADM 8.2  8.8  110 A.Elbow - B.Elbow 10 58 ?49 31.2         A.Elbow - Wrist                 SNC    Nerve / Sites Rec. Site Peak Lat Ref.  Amp Ref. Segments Distance Peak Diff Ref.    ms ms V V  cm ms ms  R Median, Ulnar - Transcarpal comparison     Median Palm Wrist 2.1 ?2.2 39 ?35 Median Palm - Wrist 8       Ulnar Palm Wrist 2.0  ?2.2 16 ?12 Ulnar Palm - Wrist 8          Median Palm - Ulnar Palm  0.1 ?0.4  L Median, Ulnar - Transcarpal comparison     Median Palm Wrist 2.4 ?2.2 61 ?35 Median Palm - Wrist 8       Ulnar Palm Wrist 1.9 ?2.2 27 ?12 Ulnar Palm - Wrist 8          Median Palm - Ulnar Palm  0.6 ?0.4  R Median - Orthodromic (Dig II, Mid palm)     Dig II Wrist 2.9 ?3.4 20 ?10 Dig II - Wrist 13    L Median - Orthodromic (Dig II, Mid palm)     Dig  II Wrist 3.5 ?3.4 26 ?10 Dig II - Wrist 13    R Ulnar - Orthodromic, (Dig V, Mid palm)     Dig V Wrist 2.6 ?3.1 12 ?5 Dig V - Wrist 11    L Ulnar - Orthodromic, (Dig V, Mid palm)     Dig V Wrist 2.9 ?3.1 15 ?5 Dig V - Wrist 81                   F  Wave    Nerve F Lat Ref.   ms ms  R Ulnar - ADM 29.7 ?32.0  L Ulnar - ADM 29.0 ?32.0        EMG FULL  EMG Summary Table    Spontaneous MUAP Recruitment  Muscle IA Fib PSW Fasc Other Amp Dur. Poly Pattern  Bilat. Deltoid Normal None None None _______ Normal Normal Normal Normal  Bilat. Triceps brachii Normal None None None _______ Normal Normal Normal Normal  Bilat. Pronator teres Normal None None None _______ Normal Normal Normal Normal  Bilat. Opponens pollicis Normal None None None _______ Normal Normal Normal Normal  Bilat. First dorsal interosseous Normal None None None _______ Normal Normal Normal Normal  Bilat. Cervical paraspinals (low) Normal None None None _______ Normal Normal Normal Normal

## 2017-01-07 ENCOUNTER — Other Ambulatory Visit: Payer: Self-pay | Admitting: Neurology

## 2017-01-07 MED ORDER — BIMATOPROST 0.03 % EX SOLN: CUTANEOUS | 12 refills | Status: DC

## 2017-01-07 MED ORDER — TRETINOIN 0.05 % EX CREA: TOPICAL_CREAM | Freq: Every day | CUTANEOUS | 11 refills | Status: DC

## 2017-01-27 ENCOUNTER — Other Ambulatory Visit: Payer: Self-pay | Admitting: Gynecology

## 2017-01-27 MED FILL — PROGESTERONE 100 MG CAPSULE: 100 | 30 days supply | Qty: 30 | Fill #0

## 2017-01-27 MED FILL — ESTRADIOL 0.5 MG TABLET: 0.5 | 30 days supply | Qty: 30 | Fill #0

## 2017-01-30 ENCOUNTER — Encounter: Payer: Self-pay | Admitting: Gynecology

## 2017-01-30 ENCOUNTER — Ambulatory Visit (INDEPENDENT_AMBULATORY_CARE_PROVIDER_SITE_OTHER): Payer: 59 | Admitting: Gynecology

## 2017-01-30 VITALS — BP 140/84 | Ht 70.0 in | Wt 234.0 lb

## 2017-01-30 DIAGNOSIS — Z79890 Hormone replacement therapy (postmenopausal): Secondary | ICD-10-CM

## 2017-01-30 DIAGNOSIS — Z01419 Encounter for gynecological examination (general) (routine) without abnormal findings: Secondary | ICD-10-CM

## 2017-01-30 MED ORDER — ESTRADIOL 0.5 MG PO TABS: 0.5000 mg | ORAL_TABLET | Freq: Every day | ORAL | 4 refills | Status: DC

## 2017-01-30 MED ORDER — PROGESTERONE MICRONIZED 100 MG PO CAPS: 100.0000 mg | ORAL_CAPSULE | Freq: Every day | ORAL | 4 refills | Status: DC

## 2017-01-30 NOTE — Progress Notes (Signed)
    Martha Vincent, Dr. June 12, 1965 [id number]        51 y.o.  [id number] for annual gynecologic exam.  Doing well.  Past medical history,surgical history, problem list, medications, allergies, family history and social history were all reviewed and documented as reviewed in the EPIC chart.  ROS:  Performed with pertinent positives and negatives included in the history, assessment and plan.   Additional significant findings : None   Exam: Caryn Bee assistant Vitals:   01/30/17 1219  BP: 140/84  Weight: 234 lb (106.1 kg)  Height: 5\' 10"  (1.778 m)   Body mass index is 33.58 kg/m.  General appearance:  Normal affect, orientation and appearance. Skin: Grossly normal HEENT: Without gross lesions.  No cervical or supraclavicular adenopathy. Thyroid normal.  Lungs:  Clear without wheezing, rales or rhonchi Cardiac: RR, without RMG Abdominal:  Soft, nontender, without masses, guarding, rebound, organomegaly or hernia Breasts:  Examined lying and sitting without masses, retractions, discharge or axillary adenopathy. Pelvic:  Ext, BUS, Vagina: Normal  Cervix: Normal  Uterus: Anteverted, normal size, shape and contour, midline and mobile nontender   Adnexa: Without masses or tenderness    Anus and perineum: Normal   Rectovaginal: Normal sphincter tone without palpated masses or tenderness.    Assessment/Plan:  51 y.o. B2I2035 female for annual gynecologic exam.   1. Postmenopausal/HRT.  Started on estradiol 0.5 mg and Prometrium 100 mg this past year due to early menopausal symptoms.  Has done well with this and wants to continue.  Has done no bleeding.  I again reviewed the issues of HRT to include risks such as stroke heart attack DVT in the breast cancer issue.  Benefits to include symptom relief as well as cardiovascular and bone health reviewed.  At this point patient wants to continue and I refilled her times 1 year.  He will call if any bleeding. 2. Pap smear 2017.  No Pap smear  done today.  No history of abnormal Pap smears.  Will plan repeat Pap smear at 3-year interval per current screening guidelines. 3. Mammography 04/2015.  Reminded patient she is overdue and she agrees to call and schedule.  Breast exam normal today. 4. Colonoscopy 2016.  Repeat at their recommended interval. 5. DEXA never.  Will plan further into the menopause. 6. Health maintenance.  Mild elevated blood pressure noted to the patient at 140/84.  Patient to follow with repeat.  No routine lab work done as patient reports this done elsewhere.  Follow-up 1 year, sooner as needed.   Anastasio Auerbach MD, 1:13 PM 01/30/2017

## 2017-01-30 NOTE — Patient Instructions (Signed)
Follow-up in 1 year for annual exam, sooner if any issues. 

## 2017-02-01 ENCOUNTER — Telehealth: Payer: Self-pay | Admitting: *Deleted

## 2017-02-01 MED ORDER — NONFORMULARY OR COMPOUNDED ITEM: 0 refills | Status: DC

## 2017-02-01 NOTE — Telephone Encounter (Signed)
Patient called requesting refill on testosterone 2 % cream. Please advise

## 2017-02-01 NOTE — Telephone Encounter (Signed)
Rx called in 

## 2017-02-01 NOTE — Telephone Encounter (Signed)
Okay to refill? 

## 2017-02-06 ENCOUNTER — Other Ambulatory Visit: Payer: Self-pay | Admitting: Neurology

## 2017-02-06 DIAGNOSIS — F902 Attention-deficit hyperactivity disorder, combined type: Secondary | ICD-10-CM

## 2017-02-06 MED ORDER — AMPHETAMINE-DEXTROAMPHET ER 30 MG PO CP24: 30.0000 mg | ORAL_CAPSULE | Freq: Every day | ORAL | 0 refills | Status: DC

## 2017-02-10 MED FILL — ADDERALL XR 30 MG CAP SA: 30 | 30 days supply | Qty: 30 | Fill #0

## 2017-03-07 MED FILL — ESTRADIOL 0.5 MG TABLET: 0.5 | 90 days supply | Qty: 90 | Fill #0

## 2017-03-07 MED FILL — PROGESTERONE 100 MG CAPSULE: 100 | 90 days supply | Qty: 90 | Fill #0

## 2017-03-23 ENCOUNTER — Other Ambulatory Visit: Payer: Self-pay | Admitting: Neurology

## 2017-03-23 DIAGNOSIS — F902 Attention-deficit hyperactivity disorder, combined type: Secondary | ICD-10-CM

## 2017-03-23 MED ORDER — AMPHETAMINE-DEXTROAMPHET ER 30 MG PO CP24: 30.0000 mg | ORAL_CAPSULE | Freq: Every day | ORAL | 0 refills | Status: DC

## 2017-05-01 ENCOUNTER — Other Ambulatory Visit: Payer: Self-pay | Admitting: Neurology

## 2017-05-01 MED ORDER — OSELTAMIVIR PHOSPHATE 75 MG PO CAPS: 75.0000 mg | ORAL_CAPSULE | Freq: Two times a day (BID) | ORAL | 0 refills | Status: DC

## 2017-05-01 MED FILL — OSELTAMIVIR PHOSPHATE 75 MG: 75 | 5 days supply | Qty: 10 | Fill #0

## 2017-05-08 ENCOUNTER — Other Ambulatory Visit: Payer: Self-pay | Admitting: Neurology

## 2017-05-08 DIAGNOSIS — F902 Attention-deficit hyperactivity disorder, combined type: Secondary | ICD-10-CM

## 2017-05-08 MED ORDER — AMPHETAMINE-DEXTROAMPHET ER 30 MG PO CP24: 30.0000 mg | ORAL_CAPSULE | Freq: Every day | ORAL | 0 refills | Status: DC

## 2017-05-25 ENCOUNTER — Encounter: Payer: Self-pay | Admitting: Sports Medicine

## 2017-05-25 ENCOUNTER — Ambulatory Visit: Payer: PRIVATE HEALTH INSURANCE | Admitting: Sports Medicine

## 2017-05-25 DIAGNOSIS — M23306 Other meniscus derangements, unspecified meniscus, right knee: Secondary | ICD-10-CM | POA: Diagnosis not present

## 2017-05-25 NOTE — Progress Notes (Signed)
CC; RT Knee pain  Patient with arthroscopy 05/2015 RT knee Seen by me 05/2016 Conservative care since then has improved knee  Function Has worked with trainer and lost weight Deep knee pain and grinding are less common Now able to fully straghten knee  Plans trip to [place] Wants advice for hiking  ROS No swelling No locking No giving way  PE Pleasant F in NAD BP 132/90   Ht 5' 10.08" (1.78 m)   Wt 220 lb (99.8 kg)   BMI 31.50 kg/m    Knee: Normal to inspection with no erythema or effusion or obvious bony abnormalities. Palpation normal with no warmth or joint line tenderness or patellar tenderness or condyle tenderness. ROM normal in flexion and extension and lower leg rotation. Ligaments with solid consistent endpoints including ACL, PCL, LCL, MCL. Negative Mcmurray's and provocative meniscal tests. Non painful patellar compression. Patellar and quadriceps tendons unremarkable. Hamstring and quadriceps strength is normal.  Mild medial joint line TTP Less crepitation under patella than on past exam  Note her extension is normal now as well  [place] of Right knee No SPP effusion Medial joint line shows some spurring, absent area of medial meniscus in ML. 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 Oneida Alar, MD

## 2017-05-25 NOTE — Patient Instructions (Signed)
-   We will be sending you home with a knee brace that we expect will improve pain and mobility.  - Continue to remain active - Follow up in the clinic in __ weeks or sooner as needed.

## 2017-05-25 NOTE — Assessment & Plan Note (Signed)
This has improved clinically [place] does show slt. More spurring  I think she is good candiate for HEP Use body helix for standing and exercise Add some biking  Reck if probkens

## 2017-06-05 ENCOUNTER — Other Ambulatory Visit: Payer: Self-pay | Admitting: Neurology

## 2017-06-05 DIAGNOSIS — F902 Attention-deficit hyperactivity disorder, combined type: Secondary | ICD-10-CM

## 2017-06-05 MED ORDER — AMPHETAMINE-DEXTROAMPHET ER 30 MG PO CP24: 30.0000 mg | ORAL_CAPSULE | Freq: Every day | ORAL | 0 refills | Status: DC

## 2017-06-05 MED FILL — ESTRADIOL 0.5 MG TABS: 0.5 | 90 days supply | Qty: 90 | Fill #1

## 2017-06-05 MED FILL — PROGESTERONE MICRONIZED 100: 100 | 90 days supply | Qty: 90 | Fill #1

## 2017-07-20 ENCOUNTER — Other Ambulatory Visit: Payer: Self-pay | Admitting: Neurology

## 2017-07-20 DIAGNOSIS — F902 Attention-deficit hyperactivity disorder, combined type: Secondary | ICD-10-CM

## 2017-07-20 MED ORDER — AMPHETAMINE-DEXTROAMPHET ER 30 MG PO CP24: 30.0000 mg | ORAL_CAPSULE | Freq: Every day | ORAL | 0 refills | Status: DC

## 2017-07-21 ENCOUNTER — Other Ambulatory Visit: Payer: Self-pay | Admitting: Gynecology

## 2017-07-21 DIAGNOSIS — Z1231 Encounter for screening mammogram for malignant neoplasm of breast: Secondary | ICD-10-CM

## 2017-07-28 ENCOUNTER — Ambulatory Visit: Payer: PRIVATE HEALTH INSURANCE | Admitting: Gynecology

## 2017-07-28 ENCOUNTER — Encounter: Payer: Self-pay | Admitting: Gynecology

## 2017-07-28 VITALS — BP 140/90 | Ht 69.0 in | Wt 236.0 lb

## 2017-07-28 DIAGNOSIS — Z79890 Hormone replacement therapy (postmenopausal): Secondary | ICD-10-CM | POA: Diagnosis not present

## 2017-07-28 DIAGNOSIS — Z01419 Encounter for gynecological examination (general) (routine) without abnormal findings: Secondary | ICD-10-CM | POA: Diagnosis not present

## 2017-07-28 MED ORDER — ESTRADIOL 0.5 MG PO TABS: 0.5000 mg | ORAL_TABLET | Freq: Every day | ORAL | 4 refills | Status: DC

## 2017-07-28 MED ORDER — PROGESTERONE MICRONIZED 100 MG PO CAPS: 100.0000 mg | ORAL_CAPSULE | Freq: Every day | ORAL | 4 refills | Status: DC

## 2017-07-28 NOTE — Progress Notes (Signed)
    Franki Brahm, Dr. [birth year] [id number]        52 y.o.  [id number] for annual gynecologic exam.  Actually had her annual exam in November but received reminder which was out of sync and she followed up now without not really thinking about when her last exam was.  She wants to go ahead and have the annual exam now and then start back on an annual basis.  She has no issues doing well on HRT.  Past medical history,surgical history, problem list, medications, allergies, family history and social history were all reviewed and documented as reviewed in the EPIC chart.  ROS:  Performed with pertinent positives and negatives included in the history, assessment and plan.   Additional significant findings : None   Exam: Caryn Bee assistant Vitals:   07/28/17 1514  BP: 140/90  Weight: 236 lb (107 kg)  Height: 5\' 9"  (1.753 m)   Body mass index is 34.85 kg/m.  General appearance:  Normal affect, orientation and appearance. Skin: Grossly normal HEENT: Without gross lesions.  No cervical or supraclavicular adenopathy. Thyroid normal.  Lungs:  Clear without wheezing, rales or rhonchi Cardiac: RR, without RMG Abdominal:  Soft, nontender, without masses, guarding, rebound, organomegaly or hernia Breasts:  Examined lying and sitting without masses, retractions, discharge or axillary adenopathy. Pelvic:  Ext, BUS, Vagina: Normal  Cervix: Normal Pap smear done  Uterus: Anteverted, normal size, shape and contour, midline and mobile nontender   Adnexa: Without masses or tenderness    Anus and perineum: Normal   Rectovaginal: Normal sphincter tone without palpated masses or tenderness.    Assessment/Plan:  52 y.o. H2C9470 female for annual gynecologic exam.   1. Postmenopausal/HRT.  On estradiol 0.5 mg and Prometrium 100 mg at bedtime.  Also using testosterone 2% cream for libido.  Is doing well with these.  We have discussed the issues and risks as evidenced with her note 01/30/2017.  She  wants to continue on HRT and refill provided x1 year for the estradiol and Prometrium.  She will call when she needs more testosterone cream.  She has done no bleeding and knows to call if she does any bleeding. 2. Pap smear 06/2015.  Pap smear done today.  No history of abnormal Pap smears previously. 3. Mammography scheduled later this May.  Breast exam normal today. 4. Colonoscopy 2016.  Repeat at their recommended interval. 5. DEXA never.  Will plan further into the menopause. 6. Health maintenance.  Mild elevated blood pressure noted at 140/90.  Patient will continue to follow-up with Dr. Joylene Draft.  No routine lab work done as patient does this elsewhere.  Follow up in 1 year, sooner as needed.   Anastasio Auerbach MD, 3:42 PM 07/28/2017

## 2017-07-28 NOTE — Patient Instructions (Signed)
Follow-up in 1 year for annual exam, sooner if any issues. 

## 2017-07-28 NOTE — Addendum Note (Signed)
Addended by: Nelva Nay on: 07/28/2017 04:00 PM   Modules accepted: Orders

## 2017-07-31 LAB — PAP IG W/ RFLX HPV ASCU

## 2017-08-09 ENCOUNTER — Ambulatory Visit
Admission: RE | Admit: 2017-08-09 | Discharge: 2017-08-09 | Disposition: A | Discharge status: Home / Self Care | Payer: PRIVATE HEALTH INSURANCE | Source: Ambulatory Visit | Attending: Gynecology | Admitting: Gynecology

## 2017-08-09 DIAGNOSIS — Z1231 Encounter for screening mammogram for malignant neoplasm of breast: Secondary | ICD-10-CM

## 2017-08-23 ENCOUNTER — Other Ambulatory Visit: Payer: Self-pay | Admitting: Neurology

## 2017-08-23 DIAGNOSIS — F902 Attention-deficit hyperactivity disorder, combined type: Secondary | ICD-10-CM

## 2017-08-23 MED ORDER — AMPHETAMINE-DEXTROAMPHET ER 30 MG PO CP24: 30.0000 mg | ORAL_CAPSULE | Freq: Every day | ORAL | 0 refills | Status: DC

## 2017-10-17 ENCOUNTER — Other Ambulatory Visit: Payer: Self-pay | Admitting: Neurology

## 2017-10-17 DIAGNOSIS — F902 Attention-deficit hyperactivity disorder, combined type: Secondary | ICD-10-CM

## 2017-10-17 MED ORDER — AMPHETAMINE-DEXTROAMPHET ER 30 MG PO CP24: 30.0000 mg | ORAL_CAPSULE | Freq: Every day | ORAL | 0 refills | Status: DC

## 2017-11-07 MED FILL — ESTRADIOL 0.5 MG TABLET: 0.5 | 90 days supply | Qty: 90 | Fill #2

## 2017-11-23 ENCOUNTER — Other Ambulatory Visit: Payer: Self-pay | Admitting: Neurology

## 2017-11-23 DIAGNOSIS — F902 Attention-deficit hyperactivity disorder, combined type: Secondary | ICD-10-CM

## 2017-11-23 MED ORDER — AMPHETAMINE-DEXTROAMPHET ER 30 MG PO CP24: 30.0000 mg | ORAL_CAPSULE | Freq: Every day | ORAL | 0 refills | Status: DC

## 2018-01-02 ENCOUNTER — Other Ambulatory Visit: Payer: Self-pay | Admitting: Neurology

## 2018-01-02 DIAGNOSIS — F902 Attention-deficit hyperactivity disorder, combined type: Secondary | ICD-10-CM

## 2018-01-02 MED ORDER — AMPHETAMINE-DEXTROAMPHET ER 30 MG PO CP24: 30.0000 mg | ORAL_CAPSULE | Freq: Every day | ORAL | 0 refills | Status: DC

## 2018-02-22 ENCOUNTER — Other Ambulatory Visit: Payer: Self-pay | Admitting: Neurology

## 2018-02-22 DIAGNOSIS — F902 Attention-deficit hyperactivity disorder, combined type: Secondary | ICD-10-CM

## 2018-02-23 ENCOUNTER — Other Ambulatory Visit: Payer: Self-pay | Admitting: Neurology

## 2018-02-23 DIAGNOSIS — F902 Attention-deficit hyperactivity disorder, combined type: Secondary | ICD-10-CM

## 2018-02-23 MED ORDER — AMPHETAMINE-DEXTROAMPHET ER 30 MG PO CP24: 30.0000 mg | ORAL_CAPSULE | Freq: Every day | ORAL | 0 refills | Status: DC

## 2018-03-29 ENCOUNTER — Other Ambulatory Visit: Payer: Self-pay | Admitting: Neurology

## 2018-03-29 DIAGNOSIS — F902 Attention-deficit hyperactivity disorder, combined type: Secondary | ICD-10-CM

## 2018-03-29 MED ORDER — AMPHETAMINE-DEXTROAMPHET ER 30 MG PO CP24: 30.0000 mg | ORAL_CAPSULE | Freq: Every day | ORAL | 0 refills | Status: DC

## 2018-03-29 MED ORDER — TRETINOIN 0.05 % EX CREA: TOPICAL_CREAM | Freq: Every day | CUTANEOUS | 11 refills | Status: DC

## 2018-04-30 ENCOUNTER — Telehealth: Payer: Self-pay | Admitting: Neurology

## 2018-04-30 NOTE — Telephone Encounter (Signed)
PA completed and approved through [facility] for Tretinoin cream. Approved 12 months through 04/26/2019 [id number]

## 2018-05-11 ENCOUNTER — Ambulatory Visit: Payer: PRIVATE HEALTH INSURANCE | Admitting: Cardiology

## 2018-05-11 ENCOUNTER — Encounter: Payer: Self-pay | Admitting: Cardiology

## 2018-05-11 VITALS — BP 137/81 | HR 67 | Ht 70.0 in | Wt 245.0 lb

## 2018-05-11 DIAGNOSIS — R0609 Other forms of dyspnea: Secondary | ICD-10-CM

## 2018-05-11 DIAGNOSIS — I10 Essential (primary) hypertension: Secondary | ICD-10-CM

## 2018-05-11 DIAGNOSIS — R0600 Dyspnea, unspecified: Secondary | ICD-10-CM

## 2018-05-11 MED ORDER — VERAPAMIL HCL ER 180 MG PO CP24: 180.0000 mg | ORAL_CAPSULE | Freq: Every day | ORAL | 3 refills | Status: DC

## 2018-05-11 NOTE — Progress Notes (Signed)
Subjective:  Primary Physician:  Martha Infante, MD  Patient ID: Martha Vincent, Dr., female    DOB: 1965/07/24, 53 y.o.   MRN: [id number]  Chief Complaint  Patient presents with  . 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  She underwent echocardiogram and also routine treadmill exercise stress test.  States that since being on verapamil and also valsartan, blood pressures improved and dyspnea symptoms have improved.  She is now exercising on a regular basis.  However about 3 to 4 weeks ago she discontinue valsartan due to cough.  She feels well and otherwise no specific symptoms.  Past Medical History:  Diagnosis Date  . Acute acalculous cholecystitis 12/04/2014  . Acute cholecystitis 12/04/2014  . Chondromalacia of knee    LEFT COMPARTMENT  . Giardia   . History of cervical dysplasia   . History of gestational diabetes   . History of uterine fibroid   . Hypertension   . Palpitations   . Shortness of breath     Past Surgical History:  Procedure Laterality Date  . CESAREAN SECTION  X2  11-16-2005  . CHOLECYSTECTOMY N/A 12/04/2014   Procedure: LAPAROSCOPIC CHOLECYSTECTOMY;  Surgeon: Martha Ok, MD;  Location: [facility];  Service: General;  Laterality: N/A;  . COLPOSCOPY    . GYNECOLOGIC CRYOSURGERY  age 28  . KNEE ARTHROSCOPY Right 05/25/2015   Procedure: RIGHT KNEE ARTHROSCOPY;  Surgeon: Martha Cancel, MD;  Location: [facility];  Service: Orthopedics;  Laterality: Right;  . MYOMECTOMY ABDOMINAL APPROACH  12-29-2003  . NEGATIVE SLEEP STUDY  2015    Social History   Socioeconomic History  . Marital status: Married    Spouse name: Not on file  . Number of children: 1  . Years of education: Not on file  . Highest education level: Not on file  Occupational History  . Not on file  Social Needs  . Financial  resource strain: Not on file  . Food insecurity:    Worry: Not on file    Inability: Not on file  . Transportation needs:    Medical: Not on file    Non-medical: Not on file  Tobacco Use  . Smoking status: Never Smoker  . Smokeless tobacco: Never Used  Substance and Sexual Activity  . Alcohol use: Yes    Alcohol/week: 4.0 standard drinks    Types: 4 Standard drinks or equivalent per week    Comment: 4 glasses of wine per week  . Drug use: No  . Sexual activity: Yes    Comment: 1st intercourse 53 yo-Fewer than 5 partners  Lifestyle  . Physical activity:    Days per week: Not on file    Minutes per session: Not on file  . Stress: Not on file  Relationships  . Social connections:    Talks on phone: Not on file    Gets together: Not on file    Attends religious service: Not on file    Active member of club or organization: Not on file    Attends meetings of clubs or organizations: Not on file    Relationship status: Not on file  . Intimate partner violence:    Fear of current or ex partner: Not on file    Emotionally abused: Not on file    Physically abused:  Not on file    Forced sexual activity: Not on file  Other Topics Concern  . Not on file  Social History Narrative   ** Merged History Encounter **        Current Outpatient Medications on File Prior to Visit  Medication Sig Dispense Refill  . amphetamine-dextroamphetamine (ADDERALL XR) 10 MG 24 hr capsule Take 10 mg by mouth daily. Not taking on weekends.    . Cholecalciferol (VITAMIN D PO) Take by mouth.    . diphenhydrAMINE (BENADRYL) 25 MG tablet Take by mouth as needed.     Marland Kitchen estradiol (ESTRACE) 0.5 MG tablet Take 1 tablet (0.5 mg total) by mouth daily. 90 tablet 4  . ibuprofen (ADVIL,MOTRIN) 200 MG tablet Take 200 mg by mouth every 6 (six) hours as needed for headache or mild pain. Reported on 06/12/2015    . Melatonin 10 MG TABS Take 10 mg by mouth as needed (for sleep). Reported on 06/12/2015    . NONFORMULARY  OR COMPOUNDED ITEM testosterone 2% cream apply small amount to periclitoral area 90 each 0  . progesterone (PROMETRIUM) 100 MG capsule Take 1 capsule (100 mg total) by mouth at bedtime. 90 capsule 4  . tretinoin (RETIN-A) 0.05 % cream Apply topically at bedtime. 45 g 11  . bimatoprost (LATISSE) 0.03 % ophthalmic solution Place one drop on applicator and apply evenly along the skin of the upper eyelids and base of eyelashes once daily at bedtime for each eye. (Patient not taking: Reported on 07/28/2017) 5 mL 12  . ezetimibe (ZETIA) 10 MG tablet   3   No current facility-administered medications on file prior to visit.      Review of Systems  Constitutional: Negative for malaise/fatigue and weight loss.  Respiratory: Negative for cough and hemoptysis.   Cardiovascular: Negative for chest pain, palpitations, claudication and leg swelling.  Gastrointestinal: Negative for abdominal pain, blood in stool, constipation, heartburn and vomiting.  Genitourinary: Negative for dysuria.  Musculoskeletal: Negative for joint pain and myalgias.  Neurological: Negative for dizziness, focal weakness and headaches.  Endo/Heme/Allergies: Does not bruise/bleed easily.  Psychiatric/Behavioral: Negative for depression. The patient is not nervous/anxious.   All other systems reviewed and are negative.      Objective:  Blood pressure 137/81, pulse 67, height 5\' 10"  (1.778 m), weight 245 lb (111.1 kg), SpO2 100 %. Body mass index is 35.15 kg/m.  Physical Exam  Constitutional: She appears healthy. No distress.  Moderately obese  Eyes: Conjunctivae are normal.  Neck: Neck supple. No JVD present.  Cardiovascular: Regular rhythm, normal heart sounds, intact distal pulses and normal pulses. Exam reveals no gallop.  No murmur heard. Pulmonary/Chest: Breath sounds normal. She exhibits no tenderness.  Abdominal: Soft. Bowel sounds are normal.  Musculoskeletal: Normal range of motion.  Neurological: She is alert  and oriented to person, place, and time.  Skin: Skin is warm.   CARDIAC STUDIES:  Echo- 04/06/2018 1. Left ventricle cavity is normal in size. Normal global wall motion. Calculated EF 64%. 2. Left atrial cavity is borderline dilated. 3. Mild (Grade I) mitral regurgitation. 4. Mild tricuspid regurgitation. No evidence of pulmonary hypertension. 5. Minimal echo free space is seen in pericardium, may be due to tiny pericardial effusion or epicardial fat pad.  Treadmill exercise stress test 03/30/2018: Indication: Dyspnea. The patient exercised on Bruce protocol for   6:45 min. Patient achieved  8.18 METS and reached HR  160 bpm, which is   95% of maximum age-predicted HR.  Stress  test terminated due to dyspnea.  Resting EKG demonstrates Normal sinus rhythm. ST Changes: With peak exercise there was no ST-T changes of ischemia.  Arrhythmias: none. BP Response to Exercise: Resting hypertension- appropriate response. HR Response to Exercise: Exaggerated HR response suggests decreased aerobic tolerence.  Exercise capacity was below average for age.  Overall Impression: Normal stress test. Recommendations: Continue primary/secondary prevention.  Assessment & Recommendations:   1. Essential hypertension - verapamil (VERELAN PM) 180 MG 24 hr capsule; Take 1 capsule (180 mg total) by mouth daily.  2. Dyspnea on exertion  Recommendation: Patient is presently doing well and symptoms of dyspnea has improved with a 5 pound weight loss and also blood pressure control.  She discontinued valsartan due to cough, presently on verapamil SR and blood pressure is well controlled especially at home she has been recording blood pressure around 712 mmHg systolic with less than 80 mmHg diastolic.  I reviewed the results of the echocardiogram and also treadmill stress test, given her positive reinforcement, she is now exercising almost on a daily basis.  Dyspnea has also improved.  I will see her back on a as needed  basis.  Lipids are being managed by her PCP.  Adrian Prows, MD, [facility] 05/11/2018, 5:15 PM [facility]. [place] Pager: [phone] Office: [phone] If no answer Cell [phone]

## 2018-05-25 DIAGNOSIS — I10 Essential (primary) hypertension: Secondary | ICD-10-CM | POA: Insufficient documentation

## 2018-07-03 ENCOUNTER — Other Ambulatory Visit: Payer: Self-pay | Admitting: Neurology

## 2018-07-03 MED ORDER — AMPHETAMINE-DEXTROAMPHET ER 10 MG PO CP24: 10.0000 mg | ORAL_CAPSULE | Freq: Every day | ORAL | 0 refills | Status: DC

## 2018-07-24 ENCOUNTER — Other Ambulatory Visit: Payer: Self-pay | Admitting: Neurology

## 2018-07-24 MED ORDER — AMPHETAMINE-DEXTROAMPHET ER 20 MG PO CP24: 20.0000 mg | ORAL_CAPSULE | Freq: Every day | ORAL | 0 refills | Status: DC

## 2018-07-24 MED ORDER — ADDERALL XR 20 MG PO CP24: 20.0000 mg | ORAL_CAPSULE | Freq: Every day | ORAL | 0 refills | Status: DC

## 2018-08-03 ENCOUNTER — Encounter: Payer: PRIVATE HEALTH INSURANCE | Admitting: Obstetrics & Gynecology

## 2018-08-16 ENCOUNTER — Other Ambulatory Visit: Payer: Self-pay

## 2018-08-16 ENCOUNTER — Telehealth: Payer: Self-pay

## 2018-08-16 DIAGNOSIS — I10 Essential (primary) hypertension: Secondary | ICD-10-CM

## 2018-08-16 MED ORDER — VERAPAMIL HCL ER 180 MG PO CP24: 180.0000 mg | ORAL_CAPSULE | Freq: Two times a day (BID) | ORAL | 3 refills | Status: DC

## 2018-08-16 NOTE — Telephone Encounter (Signed)
Pt called and wanted to know if she can take verapamil twice a day , also needs a refill needs 180 days , [facility] pharmacy.

## 2018-08-16 NOTE — Telephone Encounter (Signed)
Patient request to take verapamil twice daily for better blood pressure control.  We will approved and sent 90-day prescription with refills.

## 2018-08-17 ENCOUNTER — Encounter: Payer: Self-pay | Admitting: Gynecology

## 2018-08-17 ENCOUNTER — Ambulatory Visit (INDEPENDENT_AMBULATORY_CARE_PROVIDER_SITE_OTHER): Payer: PRIVATE HEALTH INSURANCE | Admitting: Gynecology

## 2018-08-17 ENCOUNTER — Other Ambulatory Visit: Payer: Self-pay

## 2018-08-17 VITALS — BP 134/80 | Ht 70.0 in | Wt 245.0 lb

## 2018-08-17 DIAGNOSIS — Z01419 Encounter for gynecological examination (general) (routine) without abnormal findings: Secondary | ICD-10-CM

## 2018-08-17 DIAGNOSIS — Z79890 Hormone replacement therapy (postmenopausal): Secondary | ICD-10-CM

## 2018-08-17 MED ORDER — ESTRADIOL 0.5 MG PO TABS: 0.5000 mg | ORAL_TABLET | Freq: Every day | ORAL | 4 refills | Status: DC

## 2018-08-17 MED ORDER — PROGESTERONE MICRONIZED 100 MG PO CAPS: 100.0000 mg | ORAL_CAPSULE | Freq: Every day | ORAL | 4 refills | Status: DC

## 2018-08-17 NOTE — Progress Notes (Signed)
    Jodell Lindseth, Dr. 05/05/1965 [id number]        53 y.o.  [id number] for annual gynecologic exam.  Doing well without gynecologic complaints  Past medical history,surgical history, problem list, medications, allergies, family history and social history were all reviewed and documented as reviewed in the EPIC chart.  ROS:  Performed with pertinent positives and negatives included in the history, assessment and plan.   Additional significant findings : None   Exam: Caryn Bee assistant Vitals:   08/17/18 0927  BP: 134/80  Weight: 245 lb (111.1 kg)  Height: 5\' 10"  (1.778 m)   Body mass index is 35.15 kg/m.  General appearance:  Normal affect, orientation and appearance. Skin: Grossly normal HEENT: Without gross lesions.  No cervical or supraclavicular adenopathy. Thyroid normal.  Lungs:  Clear without wheezing, rales or rhonchi Cardiac: RR, without RMG Abdominal:  Soft, nontender, without masses, guarding, rebound, organomegaly or hernia Breasts:  Examined lying and sitting without masses, retractions, discharge or axillary adenopathy. Pelvic:  Ext, BUS, Vagina: Normal  Cervix: Normal  Uterus: Anteverted, normal size, shape and contour, midline and mobile nontender   Adnexa: Without masses or tenderness    Anus and perineum: Normal   Rectovaginal: Normal sphincter tone without palpated masses or tenderness.    Assessment/Plan:  53 y.o. [id number] female for annual gynecologic exam.   1. Postmenopausal/HRT.  Continues on estradiol 0.5 mg and Prometrium 100 mg at bedtime.  Is also using testosterone 2% cream for libido.  Doing well overall.  Wants to continue.  We again discussed risks versus benefits to include increased risk of thrombosis such as stroke heart attack DVT in the breast cancer issue.  She is comfortable continuing and I refilled her x1 year.  Will call if any bleeding. 2. Mammography coming due in the patient will schedule.  Breast exam normal today. 3. Pap smear  2019.  No Pap smear done today.  No history of abnormal Pap smears.  Plan repeat Pap smear at 3-year interval per current screening guidelines. 4. Colonoscopy 2016.  Repeat at their recommended interval. 5. DEXA never.  Will plan further into the menopause. 6. Health maintenance.  No routine lab work done as patient does this at Dr. Silvestre Mesi office.  Follow-up 1 year, sooner as needed.   Anastasio Auerbach MD, 10:34 AM 08/17/2018

## 2018-08-17 NOTE — Patient Instructions (Signed)
Follow-up in 1 year for annual exam, sooner if any issues. 

## 2018-08-24 ENCOUNTER — Other Ambulatory Visit: Payer: Self-pay | Admitting: Neurology

## 2018-08-24 MED ORDER — METFORMIN HCL 500 MG PO TABS: 500.0000 mg | ORAL_TABLET | Freq: Two times a day (BID) | ORAL | 3 refills | Status: DC

## 2018-08-24 MED FILL — metFORMIN HCL 500 MG TABS: 500 | 30 days supply | Qty: 60 | Fill #0

## 2018-10-15 ENCOUNTER — Other Ambulatory Visit: Payer: Self-pay | Admitting: Neurology

## 2018-10-15 NOTE — Telephone Encounter (Signed)
[facility] Database Verified LR: 07-24-2018 Qty: 30 Pending appointment: No pending appt

## 2018-10-24 ENCOUNTER — Other Ambulatory Visit: Payer: Self-pay

## 2018-10-24 ENCOUNTER — Ambulatory Visit
Admission: RE | Admit: 2018-10-24 | Discharge: 2018-10-24 | Disposition: A | Discharge status: Home / Self Care | Payer: PRIVATE HEALTH INSURANCE | Source: Ambulatory Visit | Attending: Gynecology | Admitting: Gynecology

## 2018-10-24 ENCOUNTER — Other Ambulatory Visit: Payer: Self-pay | Admitting: Gynecology

## 2018-10-24 DIAGNOSIS — Z1231 Encounter for screening mammogram for malignant neoplasm of breast: Secondary | ICD-10-CM

## 2018-10-29 ENCOUNTER — Telehealth: Payer: Self-pay | Admitting: Internal Medicine

## 2018-10-29 ENCOUNTER — Telehealth: Payer: Self-pay | Admitting: Sports Medicine

## 2018-10-29 NOTE — Telephone Encounter (Signed)
Pt called requesting to get a email from [facility] regarding a knee replacement advice. [email or web address]

## 2018-10-30 ENCOUNTER — Other Ambulatory Visit: Payer: Self-pay | Admitting: *Deleted

## 2018-10-30 DIAGNOSIS — M23306 Other meniscus derangements, unspecified meniscus, right knee: Secondary | ICD-10-CM

## 2018-10-31 ENCOUNTER — Other Ambulatory Visit: Payer: Self-pay

## 2018-10-31 ENCOUNTER — Ambulatory Visit
Admission: RE | Admit: 2018-10-31 | Discharge: 2018-10-31 | Disposition: A | Discharge status: Home / Self Care | Payer: PRIVATE HEALTH INSURANCE | Source: Ambulatory Visit | Attending: Sports Medicine | Admitting: Sports Medicine

## 2018-10-31 DIAGNOSIS — M23306 Other meniscus derangements, unspecified meniscus, right knee: Secondary | ICD-10-CM

## 2018-11-22 ENCOUNTER — Telehealth: Payer: Self-pay

## 2018-11-22 MED ORDER — NONFORMULARY OR COMPOUNDED ITEM: 1 refills | Status: DC

## 2018-11-22 NOTE — Telephone Encounter (Signed)
Needs refill on testosterone cream.

## 2018-11-27 ENCOUNTER — Other Ambulatory Visit: Payer: Self-pay | Admitting: Neurology

## 2018-11-27 MED ORDER — ADDERALL XR 20 MG PO CP24: 20.0000 mg | ORAL_CAPSULE | Freq: Every day | ORAL | 0 refills | Status: DC

## 2018-12-12 ENCOUNTER — Encounter: Payer: Self-pay | Admitting: Gynecology

## 2019-01-01 ENCOUNTER — Other Ambulatory Visit: Payer: Self-pay | Admitting: Cardiology

## 2019-01-01 DIAGNOSIS — I10 Essential (primary) hypertension: Secondary | ICD-10-CM

## 2019-01-01 MED ORDER — SPIRONOLACTONE 25 MG PO TABS: 25.0000 mg | ORAL_TABLET | ORAL | 0 refills | Status: DC

## 2019-01-01 MED ORDER — HYDRALAZINE HCL 25 MG PO TABS: 25.0000 mg | ORAL_TABLET | Freq: Three times a day (TID) | ORAL | 0 refills | Status: DC | PRN

## 2019-01-01 MED FILL — SPIRONOLACTONE 25 MG TABLET: 25 | 30 days supply | Qty: 30 | Fill #0

## 2019-01-01 MED FILL — hydrALAZINE HCL 25 MG TABS: 25 | 10 days supply | Qty: 30 | Fill #0

## 2019-01-01 NOTE — Progress Notes (Signed)
Dr. 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 May be related to recent steroid injection to knee and also personal stress.  Will Try Aldactone 25 mg daily and Hydralazine for SBP > 150 mm Hg PRN. Will F/U in 2 weeks.    ICD-10-CM   1. Essential hypertension  I10 spironolactone (ALDACTONE) 25 MG tablet    hydrALAZINE (APRESOLINE) 25 MG tablet    JG

## 2019-02-01 ENCOUNTER — Other Ambulatory Visit: Payer: Self-pay | Admitting: Neurology

## 2019-02-01 MED ORDER — ADDERALL XR 20 MG PO CP24: 20.0000 mg | ORAL_CAPSULE | Freq: Every day | ORAL | 0 refills | Status: DC

## 2019-02-04 ENCOUNTER — Other Ambulatory Visit: Payer: Self-pay | Admitting: Gynecology

## 2019-03-06 ENCOUNTER — Other Ambulatory Visit: Payer: Self-pay | Admitting: Neurology

## 2019-03-06 MED ORDER — ADDERALL XR 20 MG PO CP24: 20.0000 mg | ORAL_CAPSULE | Freq: Every day | ORAL | 0 refills | Status: DC

## 2019-04-01 ENCOUNTER — Other Ambulatory Visit: Payer: Self-pay | Admitting: Neurology

## 2019-04-01 DIAGNOSIS — F902 Attention-deficit hyperactivity disorder, combined type: Secondary | ICD-10-CM

## 2019-04-01 MED ORDER — ADDERALL XR 20 MG PO CP24: 20.0000 mg | ORAL_CAPSULE | Freq: Every day | ORAL | 0 refills | Status: DC

## 2019-04-11 ENCOUNTER — Other Ambulatory Visit: Payer: Self-pay | Admitting: Neurology

## 2019-04-11 DIAGNOSIS — F902 Attention-deficit hyperactivity disorder, combined type: Secondary | ICD-10-CM

## 2019-04-11 MED ORDER — AMPHETAMINE-DEXTROAMPHET ER 20 MG PO CP24: 20.0000 mg | ORAL_CAPSULE | Freq: Every day | ORAL | 0 refills | Status: DC

## 2019-04-12 ENCOUNTER — Other Ambulatory Visit: Payer: Self-pay | Admitting: Neurology

## 2019-04-12 DIAGNOSIS — F902 Attention-deficit hyperactivity disorder, combined type: Secondary | ICD-10-CM

## 2019-04-12 MED ORDER — AMPHETAMINE-DEXTROAMPHET ER 20 MG PO CP24: 20.0000 mg | ORAL_CAPSULE | Freq: Every day | ORAL | 0 refills | Status: DC

## 2019-05-06 ENCOUNTER — Other Ambulatory Visit: Payer: Self-pay | Admitting: Neurology

## 2019-05-06 ENCOUNTER — Telehealth: Payer: Self-pay | Admitting: *Deleted

## 2019-05-06 DIAGNOSIS — F902 Attention-deficit hyperactivity disorder, combined type: Secondary | ICD-10-CM

## 2019-05-06 DIAGNOSIS — Z1152 Encounter for screening for COVID-19: Secondary | ICD-10-CM

## 2019-05-06 MED ORDER — ADDERALL XR 30 MG PO CP24: 30.0000 mg | ORAL_CAPSULE | Freq: Every day | ORAL | 0 refills | Status: DC

## 2019-05-06 NOTE — Telephone Encounter (Signed)
Per Cover My Meds, med approved. Message from plan: Approved today  Request Reference Number: [id number]. ADDERALL XR CAP 20MG  is approved through 05/05/2020. Your patient may now fill this prescription and it will be covered.

## 2019-05-06 NOTE — Telephone Encounter (Signed)
Received approval letter. I faxed it to the [facility] outpatient. Received a receipt of confirmation.

## 2019-05-06 NOTE — Progress Notes (Signed)
Ordering lab

## 2019-05-06 NOTE — Telephone Encounter (Signed)
Completed Adderall XR 20 mg PA on Cover My Meds requesting BRAND approval. Key: [id number]. Awaiting determination from [facility].

## 2019-05-07 ENCOUNTER — Other Ambulatory Visit (INDEPENDENT_AMBULATORY_CARE_PROVIDER_SITE_OTHER): Payer: Self-pay

## 2019-05-07 ENCOUNTER — Other Ambulatory Visit: Payer: Self-pay

## 2019-05-07 DIAGNOSIS — Z1152 Encounter for screening for COVID-19: Secondary | ICD-10-CM

## 2019-05-07 DIAGNOSIS — Z0289 Encounter for other administrative examinations: Secondary | ICD-10-CM

## 2019-05-08 DIAGNOSIS — M1711 Unilateral primary osteoarthritis, right knee: Secondary | ICD-10-CM | POA: Insufficient documentation

## 2019-05-08 LAB — SAR COV2 SEROLOGY (COVID19)AB(IGG),IA: DiaSorin SARS-CoV-2 Ab, IgG: POSITIVE — AB

## 2019-07-03 ENCOUNTER — Other Ambulatory Visit: Payer: Self-pay | Admitting: Internal Medicine

## 2019-07-03 ENCOUNTER — Other Ambulatory Visit (HOSPITAL_COMMUNITY): Payer: Self-pay | Admitting: Internal Medicine

## 2019-07-03 DIAGNOSIS — E785 Hyperlipidemia, unspecified: Secondary | ICD-10-CM

## 2019-07-05 ENCOUNTER — Other Ambulatory Visit: Payer: Self-pay | Admitting: *Deleted

## 2019-07-05 DIAGNOSIS — E785 Hyperlipidemia, unspecified: Secondary | ICD-10-CM

## 2019-07-12 ENCOUNTER — Ambulatory Visit (INDEPENDENT_AMBULATORY_CARE_PROVIDER_SITE_OTHER)
Admission: RE | Admit: 2019-07-12 | Discharge: 2019-07-12 | Disposition: A | Discharge status: Home / Self Care | Payer: Self-pay | Source: Ambulatory Visit | Attending: Cardiology | Admitting: Cardiology

## 2019-07-12 ENCOUNTER — Other Ambulatory Visit: Payer: Self-pay

## 2019-07-12 DIAGNOSIS — E785 Hyperlipidemia, unspecified: Secondary | ICD-10-CM

## 2019-07-23 NOTE — Progress Notes (Signed)
Coronary calcium score of 0. Visualized non cardiac structures including aorta normal.

## 2019-07-24 ENCOUNTER — Other Ambulatory Visit: Payer: Self-pay | Admitting: Neurology

## 2019-07-24 DIAGNOSIS — E8881 Metabolic syndrome: Secondary | ICD-10-CM

## 2019-07-24 MED ORDER — ADDERALL XR 30 MG PO CP24: 30.0000 mg | ORAL_CAPSULE | Freq: Every day | ORAL | 0 refills | Status: DC

## 2019-07-24 MED ORDER — OZEMPIC (0.25 OR 0.5 MG/DOSE) 2 MG/1.5ML ~~LOC~~ SOPN: 0.5000 mg | PEN_INJECTOR | SUBCUTANEOUS | 11 refills | Status: DC

## 2019-08-15 ENCOUNTER — Other Ambulatory Visit: Payer: Self-pay | Admitting: Neurology

## 2019-08-15 DIAGNOSIS — E8881 Metabolic syndrome: Secondary | ICD-10-CM

## 2019-08-15 MED ORDER — OZEMPIC (1 MG/DOSE) 2 MG/1.5ML ~~LOC~~ SOPN: 1.0000 mg | PEN_INJECTOR | SUBCUTANEOUS | 6 refills | Status: DC

## 2019-08-23 ENCOUNTER — Encounter: Payer: Self-pay | Admitting: Obstetrics and Gynecology

## 2019-08-23 ENCOUNTER — Ambulatory Visit (INDEPENDENT_AMBULATORY_CARE_PROVIDER_SITE_OTHER): Payer: PRIVATE HEALTH INSURANCE | Admitting: Obstetrics and Gynecology

## 2019-08-23 ENCOUNTER — Other Ambulatory Visit: Payer: Self-pay

## 2019-08-23 ENCOUNTER — Encounter: Payer: PRIVATE HEALTH INSURANCE | Admitting: Gynecology

## 2019-08-23 VITALS — BP 124/82 | Ht 70.0 in | Wt 236.0 lb

## 2019-08-23 DIAGNOSIS — Z01419 Encounter for gynecological examination (general) (routine) without abnormal findings: Secondary | ICD-10-CM | POA: Diagnosis not present

## 2019-08-23 DIAGNOSIS — Z79890 Hormone replacement therapy (postmenopausal): Secondary | ICD-10-CM | POA: Diagnosis not present

## 2019-08-23 MED ORDER — NONFORMULARY OR COMPOUNDED ITEM: 1 refills | Status: DC

## 2019-08-23 MED ORDER — ESTRADIOL 0.5 MG PO TABS: 0.5000 mg | ORAL_TABLET | Freq: Every day | ORAL | 3 refills | Status: DC

## 2019-08-23 MED ORDER — PROGESTERONE MICRONIZED 100 MG PO CAPS: 100.0000 mg | ORAL_CAPSULE | Freq: Every day | ORAL | 3 refills | Status: DC

## 2019-08-23 NOTE — Progress Notes (Signed)
Larey Seat, MD March 05, 1966 [id number]  SUBJECTIVE:  54 y.o. [id number] female here for an annual routine gynecologic exam. She has no gynecologic concerns. Using testosterone cream prn approximately once per week. Finds relief with HRT therapy as is.   Current Outpatient Medications  Medication Sig Dispense Refill  . amphetamine-dextroamphetamine (ADDERALL) 20 MG tablet Take 20 mg by mouth daily. 5 days a week    . Cholecalciferol (VITAMIN D PO) Take by mouth.    . estradiol (ESTRACE) 0.5 MG tablet TAKE 1 TABLET BY MOUTH ONCE DAILY 90 tablet 1  . fexofenadine (ALLEGRA) 60 MG tablet Take 60 mg by mouth 2 (two) times daily.    Marland Kitchen ibuprofen (ADVIL,MOTRIN) 200 MG tablet Take 200 mg by mouth every 6 (six) hours as needed for headache or mild pain. Reported on 06/12/2015    . Melatonin 10 MG TABS Take 10 mg by mouth as needed (for sleep). Reported on 06/12/2015    . OMEPRAZOLE PO Take by mouth.    . progesterone (PROMETRIUM) 100 MG capsule Take 1 capsule (100 mg total) by mouth at bedtime. 90 capsule 4  . Semaglutide, 1 MG/DOSE, (OZEMPIC, 1 MG/DOSE,) 2 MG/1.5ML SOPN Inject 1 mg into the skin once a week. 1 pen 6  . tretinoin (RETIN-A) 0.05 % cream Apply topically at bedtime. 45 g 11  . verapamil (VERELAN PM) 180 MG 24 hr capsule Take 1 capsule (180 mg total) by mouth 2 (two) times a day. (Patient taking differently: Take 160 mg by mouth 2 (two) times a day. ) 180 capsule 3  . bimatoprost (LATISSE) 0.03 % ophthalmic solution Place one drop on applicator and apply evenly along the skin of the upper eyelids and base of eyelashes once daily at bedtime for each eye. (Patient not taking: Reported on 07/28/2017) 5 mL 12  . diphenhydrAMINE (BENADRYL) 25 MG tablet Take by mouth as needed.     . ezetimibe (ZETIA) 10 MG tablet   3  . hydrALAZINE (APRESOLINE) 25 MG tablet Take 1 tablet (25 mg total) by mouth 3 (three) times daily as needed (For SBP > 150 mm Hg). 30 tablet 0  . metFORMIN (GLUCOPHAGE) 500 MG  tablet Take 1 tablet (500 mg total) by mouth 2 (two) times daily with a meal. (Patient not taking: Reported on 08/23/2019) 60 tablet 3  . NONFORMULARY OR COMPOUNDED ITEM testosterone 2% cream apply small amount to periclitoral area. (Patient not taking: Reported on 08/23/2019) 90 each 1   No current facility-administered medications for this visit.   Allergies: Adhesive [tape], Bactrim [sulfamethoxazole-trimethoprim], Coconut flavor, Erythromycin, and Sulfa antibiotics  No LMP recorded. Patient is postmenopausal.  Past medical history,surgical history, problem list, medications, allergies, family history and social history were all reviewed and documented as reviewed in the EPIC chart.  ROS:  Feeling well. No dyspnea or chest pain on exertion.  No abdominal pain, change in bowel habits, black or bloody stools.  No urinary tract symptoms. GYN ROS: no abnormal bleeding, pelvic pain or discharge, no breast pain or new or enlarging lumps on self exam. No neurological complaints.    OBJECTIVE:  BP 124/82   Ht 5\' 10"  (1.778 m)   Wt 236 lb (107 kg)   BMI 33.86 kg/m  The patient appears well, alert, oriented, in no distress. ENT grossly normal.  Lungs are clear, good air entry, no wheezes, rhonchi or rales. S1 and S2 normal, no murmurs, regular rate and rhythm.  Abdomen soft without tenderness, guarding, mass or organomegaly.  BREAST EXAM: breasts appear normal, no suspicious masses, no skin or nipple changes or axillary nodes  PELVIC EXAM: VULVA: normal appearing vulva with no masses, tenderness or lesions, VAGINA: normal appearing vagina with normal color and discharge, no lesions, CERVIX: normal appearing cervix without discharge or lesions, UTERUS: uterus is normal size, shape, consistency and nontender, ADNEXA: normal adnexa in size, nontender and no masses  Chaperone: Caryn Bee present during the examination  ASSESSMENT:  54 y.o. [id number] here for an annual gynecologic exam  PLAN:    1. Postmenopausal/HRT. Prior myomectomy.  She would like to continue on estradiol 0.5 mg and Prometrium 100 mg at bedtime.  Will consider cutting the estradiol in half.  She is taking the Prometrium orally as prescribed.  I am not aware of evidence to support vaginal use of the progesterone oil obtained from the soft gel as sufficient for uterine lining protection in postmenopausal women, so this would be off label use.  She would like to continue using testosterone 2% cream for increased libido, and she only uses this about once per week.  Risks of postmenopausal hormone use to include thrombotic diseases such as heart attack, stroke, DVT, breast cancer and uterine cancer are reviewed and she is comfortable with this and would like to continue the use of HRT and the testosterone cream.  Refills for all 3 are provided x1 year. 2. Pap smear 07/2017.  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  Last 2 Pap smears have been normal.  Next Pap smear due 2022 following the current guidelines recommending the 3 year interval. 3. Mammogram 10/2018.  Normal breast exam today.  She will schedule an annual mammogram this year when due. 4. Colonoscopy 2016.  Recommended that she follow up at the recommended interval. 5. DEXA will be planned when closer to age 24. 49. Health maintenance.  No labs today as she normally has these completed with Dr. Joylene Draft.   Return annually or sooner, prn.  Joseph Pierini MD 08/23/19

## 2019-09-24 ENCOUNTER — Other Ambulatory Visit: Payer: Self-pay | Admitting: Neurology

## 2019-09-24 MED ORDER — OZEMPIC (0.25 OR 0.5 MG/DOSE) 2 MG/1.5ML ~~LOC~~ SOPN: 0.5000 mg | PEN_INJECTOR | SUBCUTANEOUS | 11 refills | Status: DC

## 2019-10-30 ENCOUNTER — Other Ambulatory Visit: Payer: Self-pay | Admitting: Neurology

## 2019-10-30 MED ORDER — OZEMPIC (1 MG/DOSE) 2 MG/1.5ML ~~LOC~~ SOPN: 1.0000 mg | PEN_INJECTOR | SUBCUTANEOUS | 11 refills | Status: DC

## 2019-11-05 ENCOUNTER — Other Ambulatory Visit: Payer: Self-pay | Admitting: Neurology

## 2019-11-05 DIAGNOSIS — Z1152 Encounter for screening for COVID-19: Secondary | ICD-10-CM

## 2019-11-08 ENCOUNTER — Other Ambulatory Visit: Payer: Self-pay | Admitting: Internal Medicine

## 2019-11-08 DIAGNOSIS — R109 Unspecified abdominal pain: Secondary | ICD-10-CM

## 2019-11-08 DIAGNOSIS — R82998 Other abnormal findings in urine: Secondary | ICD-10-CM

## 2019-11-15 ENCOUNTER — Ambulatory Visit
Admission: RE | Admit: 2019-11-15 | Discharge: 2019-11-15 | Disposition: A | Discharge status: Home / Self Care | Payer: PRIVATE HEALTH INSURANCE | Source: Ambulatory Visit | Attending: Internal Medicine | Admitting: Internal Medicine

## 2019-11-15 DIAGNOSIS — R82998 Other abnormal findings in urine: Secondary | ICD-10-CM

## 2019-11-15 DIAGNOSIS — R109 Unspecified abdominal pain: Secondary | ICD-10-CM

## 2019-12-19 ENCOUNTER — Other Ambulatory Visit: Payer: Self-pay | Admitting: Neurology

## 2019-12-19 DIAGNOSIS — F909 Attention-deficit hyperactivity disorder, unspecified type: Secondary | ICD-10-CM

## 2019-12-19 MED ORDER — ADDERALL XR 30 MG PO CP24: 30.0000 mg | ORAL_CAPSULE | Freq: Every day | ORAL | 0 refills | Status: DC

## 2020-01-23 ENCOUNTER — Other Ambulatory Visit: Payer: Self-pay | Admitting: Neurology

## 2020-01-23 DIAGNOSIS — F909 Attention-deficit hyperactivity disorder, unspecified type: Secondary | ICD-10-CM

## 2020-01-23 MED ORDER — OZEMPIC (1 MG/DOSE) 2 MG/1.5ML ~~LOC~~ SOPN: 1.0000 mg | PEN_INJECTOR | SUBCUTANEOUS | 11 refills | Status: DC

## 2020-01-23 MED ORDER — ADDERALL XR 30 MG PO CP24: 30.0000 mg | ORAL_CAPSULE | Freq: Every day | ORAL | 0 refills | Status: DC

## 2020-03-03 ENCOUNTER — Other Ambulatory Visit: Payer: Self-pay

## 2020-03-03 ENCOUNTER — Other Ambulatory Visit: Payer: Self-pay | Admitting: Orthopedic Surgery

## 2020-03-03 ENCOUNTER — Ambulatory Visit
Admission: RE | Admit: 2020-03-03 | Discharge: 2020-03-03 | Disposition: A | Discharge status: Home / Self Care | Payer: PRIVATE HEALTH INSURANCE | Source: Ambulatory Visit | Attending: Orthopedic Surgery | Admitting: Orthopedic Surgery

## 2020-03-03 DIAGNOSIS — M1711 Unilateral primary osteoarthritis, right knee: Secondary | ICD-10-CM

## 2020-03-04 ENCOUNTER — Other Ambulatory Visit: Payer: Self-pay | Admitting: Neurology

## 2020-03-04 DIAGNOSIS — F909 Attention-deficit hyperactivity disorder, unspecified type: Secondary | ICD-10-CM

## 2020-03-04 MED ORDER — ADDERALL XR 30 MG PO CP24: 30.0000 mg | ORAL_CAPSULE | Freq: Every day | ORAL | 0 refills | Status: DC

## 2020-03-23 ENCOUNTER — Other Ambulatory Visit: Payer: Self-pay | Admitting: Neurology

## 2020-03-23 MED ORDER — OZEMPIC (1 MG/DOSE) 2 MG/1.5ML ~~LOC~~ SOPN: 1.0000 mg | PEN_INJECTOR | SUBCUTANEOUS | 11 refills | Status: DC

## 2020-05-01 ENCOUNTER — Ambulatory Visit
Admission: RE | Admit: 2020-05-01 | Discharge: 2020-05-01 | Disposition: A | Discharge status: Home / Self Care | Payer: PRIVATE HEALTH INSURANCE | Source: Ambulatory Visit | Attending: Obstetrics and Gynecology | Admitting: Obstetrics and Gynecology

## 2020-05-01 ENCOUNTER — Other Ambulatory Visit: Payer: Self-pay | Admitting: Obstetrics and Gynecology

## 2020-05-01 ENCOUNTER — Other Ambulatory Visit: Payer: Self-pay

## 2020-05-01 DIAGNOSIS — Z1231 Encounter for screening mammogram for malignant neoplasm of breast: Secondary | ICD-10-CM

## 2020-05-04 ENCOUNTER — Other Ambulatory Visit: Payer: Self-pay | Admitting: Neurology

## 2020-05-04 DIAGNOSIS — F909 Attention-deficit hyperactivity disorder, unspecified type: Secondary | ICD-10-CM

## 2020-05-04 MED ORDER — ADDERALL XR 30 MG PO CP24: 30.0000 mg | ORAL_CAPSULE | Freq: Every day | ORAL | 0 refills | Status: DC

## 2020-05-06 ENCOUNTER — Encounter: Payer: Self-pay | Admitting: Obstetrics and Gynecology

## 2020-06-11 ENCOUNTER — Other Ambulatory Visit: Payer: Self-pay | Admitting: Neurology

## 2020-06-11 DIAGNOSIS — F909 Attention-deficit hyperactivity disorder, unspecified type: Secondary | ICD-10-CM

## 2020-06-11 MED ORDER — ADDERALL XR 30 MG PO CP24: 30.0000 mg | ORAL_CAPSULE | Freq: Every day | ORAL | 0 refills | Status: DC

## 2020-07-07 ENCOUNTER — Other Ambulatory Visit: Payer: Self-pay | Admitting: Neurology

## 2020-07-07 DIAGNOSIS — F909 Attention-deficit hyperactivity disorder, unspecified type: Secondary | ICD-10-CM

## 2020-07-07 MED ORDER — ADDERALL XR 30 MG PO CP24: 30.0000 mg | ORAL_CAPSULE | Freq: Every day | ORAL | 0 refills | Status: DC

## 2020-08-27 ENCOUNTER — Other Ambulatory Visit: Payer: Self-pay | Admitting: Neurology

## 2020-08-27 DIAGNOSIS — F909 Attention-deficit hyperactivity disorder, unspecified type: Secondary | ICD-10-CM

## 2020-08-27 NOTE — Telephone Encounter (Signed)
Per [facility], last filled on 07/23/2020 #30/30.  Will send Rx refill to Dr. Felecia Shelling.

## 2020-08-28 ENCOUNTER — Encounter: Payer: PRIVATE HEALTH INSURANCE | Admitting: Obstetrics and Gynecology

## 2020-10-14 ENCOUNTER — Other Ambulatory Visit: Payer: Self-pay | Admitting: Neurology

## 2020-10-14 DIAGNOSIS — F909 Attention-deficit hyperactivity disorder, unspecified type: Secondary | ICD-10-CM

## 2020-10-14 MED ORDER — ADDERALL XR 30 MG PO CP24: 30.0000 mg | ORAL_CAPSULE | Freq: Every day | ORAL | 0 refills | Status: DC

## 2020-11-10 ENCOUNTER — Encounter: Payer: Self-pay | Admitting: Obstetrics and Gynecology

## 2020-11-13 NOTE — Progress Notes (Signed)
GYNECOLOGY  VISIT   HPI: 55 y.o.   Married White or Caucasian Not Hispanic or Latino  female   [phone] with No LMP recorded. Patient is postmenopausal.   here for refill on estradiol and progesterone. Patient has questions about breast cancer risk before refill is done. She has been out of medication since May of this year.   No vaginal bleeding. She is sexually active, some dryness off of estrogen, no pain.   Slight constipation with ozempic which she is on for weight loss.   No bladder c/o. Less GSI with weight loss. Will still leak a moderate amount with valsalva and a full bladder.   In the last 3-4 weeks she has noticed a change in skin around her left nipple and it is itchy and more coarse to the touch. She treated it with steroid ointment and it has resolved. She has noticed that her skin is more dry with out the estrogen. She states that she has not changed her cosmetics, shower gel, or laundry detergents. She has noticed more hair loss with out the estrogen.   Patient had normal Mammo in Feb of 2022.   She is up 2 x a night, very hot at night. Definitely felt better on HRT.   Normal HgbA1C with her primary. Lipids are are normal.   She has lost ~30 lbs in the last few years.   Cardiac calcium score was 0.   She has a low libido.   Last pap 07/28/17: WNL, h/o cryosurgery at 26-27  Mammogram: 05/01/20 Colonoscopy: at 15, small polyp, thinks she is due in December.   Social history: 4-6 drinks a week, no smoking.  Daughter is 8, starting 10th grade.  She is from [place], works as a Garment/textile technologist for [employer]. 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 Husband is a Clinical research associate. Professor at [employer].     GYNECOLOGIC HISTORY: No LMP recorded. Patient is postmenopausal. 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   Fibroid     Past Medical History:  Diagnosis Date   Acute acalculous cholecystitis 12/04/2014   Acute cholecystitis 12/04/2014   Chondromalacia of knee    LEFT COMPARTMENT   Giardia    History of cervical dysplasia    History of gestational diabetes    History of uterine fibroid    Hypertension    Palpitations    Shortness of breath     Past Surgical History:  Procedure Laterality Date   CESAREAN SECTION  X2  11-16-2005   CHOLECYSTECTOMY N/A 12/04/2014   Procedure: LAPAROSCOPIC CHOLECYSTECTOMY;  Surgeon: Ralene Ok, MD;  Location: [facility];  Service: General;  Laterality: N/A;   COLPOSCOPY     GYNECOLOGIC CRYOSURGERY  age 75   KNEE ARTHROSCOPY Right 05/25/2015   Procedure: RIGHT  KNEE ARTHROSCOPY;  Surgeon: Paralee Cancel, MD;  Location: [facility];  Service: Orthopedics;  Laterality: Right;   MYOMECTOMY ABDOMINAL APPROACH  12-29-2003   NEGATIVE SLEEP STUDY  2015    Current Outpatient Medications  Medication Sig Dispense Refill   ADDERALL XR 30 MG 24 hr capsule Take 1 capsule (30 mg total) by mouth daily. 30 capsule 0   Cholecalciferol (VITAMIN D PO) Take by mouth.     diphenhydrAMINE (BENADRYL) 25 MG tablet Take by mouth as needed.      ibuprofen (ADVIL,MOTRIN) 200 MG tablet Take 200 mg by mouth every 6 (six) hours as needed for headache or mild pain. Reported on 06/12/2015     Semaglutide, 1 MG/DOSE, (OZEMPIC, 1 MG/DOSE,) 2 MG/1.5ML SOPN Inject 1 mg into the skin once a week. 3 mL 11   tretinoin (RETIN-A) 0.05 % cream Apply topically at bedtime. 45 g 11   verapamil (VERELAN PM) 180 MG 24 hr capsule Take 1 capsule (180 mg total) by mouth 2 (two) times a day. (Patient taking differently: Take 160 mg by  mouth 2 (two) times a day.) 180 capsule 3   No current facility-administered medications for this visit.     ALLERGIES: Adhesive [tape], Bactrim [sulfamethoxazole-trimethoprim], Coconut flavor, Erythromycin, and Sulfa antibiotics  Family History  Problem Relation Age of Onset   Cleft lip Brother    Melanoma Father    Stroke Mother     Social History   Socioeconomic History   Marital status: Married    Spouse name: Not on file   Number of children: 1   Years of education: Not on file   Highest education level: Not on file  Occupational History   Not on file  Tobacco Use   Smoking status: Never   Smokeless tobacco: Never  Vaping Use   Vaping Use: Never used  Substance and Sexual Activity   Alcohol use: Yes    Alcohol/week: 4.0 standard drinks    Types: 4 Standard drinks or equivalent per week    Comment: 4 glasses of wine per week   Drug use: No   Sexual activity: Yes    Comment: 1st intercourse 55 yo-Fewer than 5 partners  Other Topics Concern   Not on file  Social History Narrative   ** Merged History Encounter **       Social Determinants of Health   Financial Resource Strain: Not on file  Food Insecurity: Not on file  Transportation Needs: Not on file  Physical Activity: Not on file  Stress: Not on file  Social Connections: Not on file  Intimate Partner Violence: Not on file    Review of Systems  Skin:        Dryness   Endo/Heme/Allergies:        Hair loss   All other systems reviewed and are negative.  PHYSICAL EXAMINATION:    BP 110/72   Pulse 81   Ht 5' 9.29" (1.76 m)   Wt 221 lb (100.2 kg)   SpO2 100%   BMI 32.36 kg/m     General appearance: alert, cooperative and appears stated age Neck: no adenopathy, supple, symmetrical, trachea midline and thyroid normal to inspection and palpation Breasts: normal appearance, no masses or tenderness Abdomen: soft, non-tender; non distended, no masses,  no organomegaly  Pelvic: External genitalia:   no lesions              Urethra:  normal appearing urethra with no masses, tenderness or lesions  Bartholins and Skenes: normal                 Vagina: normal appearing vagina with normal color and discharge, no lesions              Cervix:  8 mm non friable polyp              Bimanual Exam:  Uterus:  normal size, contour, position, consistency, mobility, non-tender and anteverted              Adnexa: no mass, fullness, tenderness              Rectovaginal: Yes.  .  Confirms.              Anus:  normal sphincter tone, no lesions  Chaperone was present for exam.  1. Well woman exam Discussed breast self exam Discussed calcium and vit D intake Mammogram UTD She will schedule her colonoscopy  2. Counseling for hormone replacement therapy Discussed risks, no contraindications. She would like to restart HRT. Will start with the lowest dose patch and prometrium. She will call if she wants vaginal estrogen - estradiol (VIVELLE-DOT) 0.025 MG/24HR; Place 1 patch onto the skin 2 (two) times a week.  Dispense: 24 patch; Refill: 4 - PROMETRIUM 100 MG capsule; Take 1 capsule (100 mg total) by mouth daily.  Dispense: 90 capsule; Refill: 4  3. Screening for cervical cancer - Cytology - PAP  4. Low libido Will check her testosterone, she will consider topical testosterone cream - Testos,Total,Free and SHBG (Female)  5. Cervical polyp Not symptomatic.

## 2020-11-16 ENCOUNTER — Telehealth: Payer: Self-pay | Admitting: *Deleted

## 2020-11-16 ENCOUNTER — Other Ambulatory Visit (HOSPITAL_COMMUNITY)
Admission: RE | Admit: 2020-11-16 | Discharge: 2020-11-16 | Disposition: A | Discharge status: Home / Self Care | Payer: No Typology Code available for payment source | Source: Ambulatory Visit | Attending: Obstetrics and Gynecology | Admitting: Obstetrics and Gynecology

## 2020-11-16 ENCOUNTER — Encounter: Payer: Self-pay | Admitting: Obstetrics and Gynecology

## 2020-11-16 ENCOUNTER — Other Ambulatory Visit: Payer: Self-pay

## 2020-11-16 ENCOUNTER — Ambulatory Visit (INDEPENDENT_AMBULATORY_CARE_PROVIDER_SITE_OTHER): Payer: No Typology Code available for payment source | Admitting: Obstetrics and Gynecology

## 2020-11-16 VITALS — BP 110/72 | HR 81 | Ht 69.29 in | Wt 221.0 lb

## 2020-11-16 DIAGNOSIS — Z124 Encounter for screening for malignant neoplasm of cervix: Secondary | ICD-10-CM

## 2020-11-16 DIAGNOSIS — Z01419 Encounter for gynecological examination (general) (routine) without abnormal findings: Secondary | ICD-10-CM

## 2020-11-16 DIAGNOSIS — N841 Polyp of cervix uteri: Secondary | ICD-10-CM

## 2020-11-16 DIAGNOSIS — R6882 Decreased libido: Secondary | ICD-10-CM

## 2020-11-16 DIAGNOSIS — K219 Gastro-esophageal reflux disease without esophagitis: Secondary | ICD-10-CM | POA: Insufficient documentation

## 2020-11-16 DIAGNOSIS — Z7189 Other specified counseling: Secondary | ICD-10-CM

## 2020-11-16 MED ORDER — ESTRADIOL 0.025 MG/24HR TD PTTW
1.0000 | MEDICATED_PATCH | TRANSDERMAL | 4 refills | Status: DC
Start: 2020-11-16 — End: 2021-12-03

## 2020-11-16 MED ORDER — PROMETRIUM 100 MG PO CAPS: 100.0000 mg | ORAL_CAPSULE | Freq: Every day | ORAL | 4 refills | Status: DC

## 2020-11-16 NOTE — Patient Instructions (Signed)

## 2020-11-16 NOTE — Telephone Encounter (Signed)
Yes, please send the generic

## 2020-11-16 NOTE — Telephone Encounter (Signed)
Generic called in to pharmacy

## 2020-11-16 NOTE — Telephone Encounter (Signed)
Pharmacy called stating that Prometrium is not covered may we send in the generic. I will send a message to provider for approval.

## 2020-11-18 LAB — CYTOLOGY - PAP
Comment: NEGATIVE
Diagnosis: NEGATIVE
High risk HPV: NEGATIVE

## 2020-11-24 LAB — TESTOS,TOTAL,FREE AND SHBG (FEMALE)
Free Testosterone: 2.6 pg/mL (ref 0.1–6.4)
Sex Hormone Binding: 73 nmol/L (ref 14–73)
Testosterone, Total, LC-MS-MS: 28 ng/dL (ref 2–45)

## 2020-11-26 ENCOUNTER — Other Ambulatory Visit: Payer: Self-pay | Admitting: Neurology

## 2020-11-26 DIAGNOSIS — F909 Attention-deficit hyperactivity disorder, unspecified type: Secondary | ICD-10-CM

## 2020-11-26 MED ORDER — ADDERALL XR 30 MG PO CP24: 30.0000 mg | ORAL_CAPSULE | Freq: Every day | ORAL | 0 refills | Status: DC

## 2020-12-31 ENCOUNTER — Other Ambulatory Visit: Payer: Self-pay | Admitting: Neurology

## 2020-12-31 DIAGNOSIS — F909 Attention-deficit hyperactivity disorder, unspecified type: Secondary | ICD-10-CM

## 2020-12-31 MED ORDER — MOUNJARO 7.5 MG/0.5ML ~~LOC~~ SOAJ: 7.5000 mg | SUBCUTANEOUS | 3 refills | Status: DC

## 2020-12-31 MED ORDER — ADDERALL XR 30 MG PO CP24: 30.0000 mg | ORAL_CAPSULE | Freq: Every day | ORAL | 0 refills | Status: DC

## 2021-01-19 ENCOUNTER — Other Ambulatory Visit: Payer: Self-pay | Admitting: Neurology

## 2021-01-19 ENCOUNTER — Other Ambulatory Visit (INDEPENDENT_AMBULATORY_CARE_PROVIDER_SITE_OTHER): Payer: Self-pay

## 2021-01-19 DIAGNOSIS — R5383 Other fatigue: Secondary | ICD-10-CM

## 2021-01-19 DIAGNOSIS — Z0289 Encounter for other administrative examinations: Secondary | ICD-10-CM

## 2021-01-21 LAB — CBC WITH DIFFERENTIAL/PLATELET
Basophils Absolute: 0 10*3/uL (ref 0.0–0.2)
Basos: 1 %
EOS (ABSOLUTE): 0.1 10*3/uL (ref 0.0–0.4)
Eos: 2 %
Hematocrit: 39.9 % (ref 34.0–46.6)
Hemoglobin: 13.6 g/dL (ref 11.1–15.9)
Immature Grans (Abs): 0 10*3/uL (ref 0.0–0.1)
Immature Granulocytes: 0 %
Lymphocytes Absolute: 2.3 10*3/uL (ref 0.7–3.1)
Lymphs: 37 %
MCH: 31.2 pg (ref 26.6–33.0)
MCHC: 34.1 g/dL (ref 31.5–35.7)
MCV: 92 fL (ref 79–97)
Monocytes Absolute: 0.4 10*3/uL (ref 0.1–0.9)
Monocytes: 7 %
Neutrophils Absolute: 3.4 10*3/uL (ref 1.4–7.0)
Neutrophils: 53 %
Platelets: 304 10*3/uL (ref 150–450)
RBC: 4.36 x10E6/uL (ref 3.77–5.28)
RDW: 12.8 % (ref 11.7–15.4)
WBC: 6.3 10*3/uL (ref 3.4–10.8)

## 2021-01-21 LAB — COMPREHENSIVE METABOLIC PANEL
ALT: 12 IU/L (ref 0–32)
AST: 16 IU/L (ref 0–40)
Albumin/Globulin Ratio: 2.4 — ABNORMAL HIGH (ref 1.2–2.2)
Albumin: 4.7 g/dL (ref 3.8–4.9)
Alkaline Phosphatase: 69 IU/L (ref 44–121)
BUN/Creatinine Ratio: 18 (ref 9–23)
BUN: 18 mg/dL (ref 6–24)
Bilirubin Total: 0.3 mg/dL (ref 0.0–1.2)
CO2: 23 mmol/L (ref 20–29)
Calcium: 10.1 mg/dL (ref 8.7–10.2)
Chloride: 103 mmol/L (ref 96–106)
Creatinine, Ser: 0.99 mg/dL (ref 0.57–1.00)
Globulin, Total: 2 g/dL (ref 1.5–4.5)
Glucose: 90 mg/dL (ref 70–99)
Potassium: 4.9 mmol/L (ref 3.5–5.2)
Sodium: 139 mmol/L (ref 134–144)
Total Protein: 6.7 g/dL (ref 6.0–8.5)
eGFR: 67 mL/min/{1.73_m2} (ref >59)

## 2021-01-21 LAB — TSH: TSH: 2.86 u[IU]/mL (ref 0.450–4.500)

## 2021-01-21 LAB — T4, FREE: Free T4: 1.25 ng/dL (ref 0.82–1.77)

## 2021-02-09 ENCOUNTER — Other Ambulatory Visit: Payer: Self-pay | Admitting: Neurology

## 2021-02-09 DIAGNOSIS — F909 Attention-deficit hyperactivity disorder, unspecified type: Secondary | ICD-10-CM

## 2021-02-09 MED ORDER — MOUNJARO 7.5 MG/0.5ML ~~LOC~~ SOAJ: 7.5000 mg | SUBCUTANEOUS | 3 refills | Status: DC

## 2021-02-09 MED ORDER — ADDERALL XR 30 MG PO CP24: 30.0000 mg | ORAL_CAPSULE | Freq: Every day | ORAL | 0 refills | Status: DC

## 2021-02-22 ENCOUNTER — Other Ambulatory Visit: Payer: Self-pay | Admitting: Internal Medicine

## 2021-02-22 DIAGNOSIS — R194 Change in bowel habit: Secondary | ICD-10-CM

## 2021-02-22 DIAGNOSIS — R109 Unspecified abdominal pain: Secondary | ICD-10-CM

## 2021-02-24 ENCOUNTER — Ambulatory Visit
Admission: RE | Admit: 2021-02-24 | Discharge: 2021-02-24 | Disposition: A | Discharge status: Home / Self Care | Payer: No Typology Code available for payment source | Source: Ambulatory Visit | Attending: Internal Medicine | Admitting: Internal Medicine

## 2021-02-24 DIAGNOSIS — R109 Unspecified abdominal pain: Secondary | ICD-10-CM

## 2021-02-24 DIAGNOSIS — R194 Change in bowel habit: Secondary | ICD-10-CM

## 2021-02-24 MED ORDER — IOPAMIDOL (ISOVUE-300) INJECTION 61%
100.0000 mL | Freq: Once | INTRAVENOUS | Status: AC | PRN
  Administered 2021-02-24: 100 mL via INTRAVENOUS

## 2021-02-26 ENCOUNTER — Ambulatory Visit: Payer: No Typology Code available for payment source | Admitting: Obstetrics and Gynecology

## 2021-03-02 ENCOUNTER — Other Ambulatory Visit: Payer: Self-pay | Admitting: Internal Medicine

## 2021-03-02 DIAGNOSIS — K769 Liver disease, unspecified: Secondary | ICD-10-CM

## 2021-03-18 ENCOUNTER — Ambulatory Visit
Admission: RE | Admit: 2021-03-18 | Discharge: 2021-03-18 | Disposition: A | Discharge status: Home / Self Care | Payer: No Typology Code available for payment source | Source: Ambulatory Visit | Attending: Internal Medicine | Admitting: Internal Medicine

## 2021-03-18 ENCOUNTER — Other Ambulatory Visit: Payer: Self-pay

## 2021-03-18 DIAGNOSIS — K769 Liver disease, unspecified: Secondary | ICD-10-CM

## 2021-03-18 MED ORDER — GADOBENATE DIMEGLUMINE 529 MG/ML IV SOLN
18.0000 mL | Freq: Once | INTRAVENOUS | Status: AC | PRN
  Administered 2021-03-18: 12:00:00 18 mL via INTRAVENOUS

## 2021-04-01 ENCOUNTER — Other Ambulatory Visit: Payer: Self-pay | Admitting: Internal Medicine

## 2021-04-01 DIAGNOSIS — K769 Liver disease, unspecified: Secondary | ICD-10-CM

## 2021-04-07 ENCOUNTER — Other Ambulatory Visit: Payer: Self-pay | Admitting: Neurology

## 2021-04-07 DIAGNOSIS — E119 Type 2 diabetes mellitus without complications: Secondary | ICD-10-CM

## 2021-04-07 DIAGNOSIS — F909 Attention-deficit hyperactivity disorder, unspecified type: Secondary | ICD-10-CM

## 2021-04-07 MED ORDER — MOUNJARO 2.5 MG/0.5ML ~~LOC~~ SOAJ: 2.5000 mg | SUBCUTANEOUS | 6 refills | Status: DC

## 2021-04-07 MED ORDER — ADDERALL XR 30 MG PO CP24: 30.0000 mg | ORAL_CAPSULE | Freq: Every day | ORAL | 0 refills | Status: DC

## 2021-04-26 ENCOUNTER — Telehealth: Payer: Self-pay | Admitting: Neurology

## 2021-04-26 NOTE — Telephone Encounter (Signed)
Spoke with Dr Jaynee Eagles.  Unable to complete PA for [facility].

## 2021-04-26 NOTE — Telephone Encounter (Signed)
At 11:04 [facility] [place] called re: the prior auth needed for pt's tirzepatide [facility]) 2.5 MG/0.5ML Pen. The Rx that was written on 01-18 She wanted to confirm if the fax that was resent has been received, please call her at [phone]

## 2021-05-04 ENCOUNTER — Other Ambulatory Visit: Payer: Self-pay | Admitting: Neurology

## 2021-05-04 DIAGNOSIS — E119 Type 2 diabetes mellitus without complications: Secondary | ICD-10-CM

## 2021-05-04 DIAGNOSIS — F909 Attention-deficit hyperactivity disorder, unspecified type: Secondary | ICD-10-CM

## 2021-05-04 MED ORDER — ADDERALL XR 30 MG PO CP24: 30.0000 mg | ORAL_CAPSULE | Freq: Every day | ORAL | 0 refills | Status: DC

## 2021-05-04 MED ORDER — MOUNJARO 2.5 MG/0.5ML ~~LOC~~ SOAJ: 2.5000 mg | SUBCUTANEOUS | 6 refills | Status: DC

## 2021-05-11 ENCOUNTER — Other Ambulatory Visit: Payer: Self-pay | Admitting: Neurology

## 2021-05-11 DIAGNOSIS — E8881 Metabolic syndrome: Secondary | ICD-10-CM

## 2021-05-11 DIAGNOSIS — I10 Essential (primary) hypertension: Secondary | ICD-10-CM

## 2021-05-11 MED ORDER — WEGOVY 1.7 MG/0.75ML ~~LOC~~ SOAJ: 1.7000 mg | SUBCUTANEOUS | 6 refills | Status: DC

## 2021-05-12 ENCOUNTER — Telehealth: Payer: Self-pay

## 2021-05-12 NOTE — Telephone Encounter (Signed)
I have submitted a PA request for Wegovy 1.7MG /0.75ML on CMM, Key: [id number].  Awaiting determination from [facility].

## 2021-05-19 ENCOUNTER — Ambulatory Visit: Payer: No Typology Code available for payment source | Admitting: Obstetrics and Gynecology

## 2021-05-24 ENCOUNTER — Other Ambulatory Visit (INDEPENDENT_AMBULATORY_CARE_PROVIDER_SITE_OTHER): Payer: Self-pay

## 2021-05-24 ENCOUNTER — Other Ambulatory Visit: Payer: Self-pay | Admitting: Neurology

## 2021-05-24 DIAGNOSIS — Z1152 Encounter for screening for COVID-19: Secondary | ICD-10-CM

## 2021-05-24 DIAGNOSIS — Z0289 Encounter for other administrative examinations: Secondary | ICD-10-CM

## 2021-05-25 LAB — SARS-COV-2 ANTIBODIES: SARS-CoV-2 Antibodies: NEGATIVE

## 2021-07-15 ENCOUNTER — Other Ambulatory Visit: Payer: Self-pay | Admitting: *Deleted

## 2021-07-15 DIAGNOSIS — F909 Attention-deficit hyperactivity disorder, unspecified type: Secondary | ICD-10-CM

## 2021-07-15 NOTE — Telephone Encounter (Signed)
Pt requests refill of Adderall XR 30 mg. Per registry, last filled 04/12/2021 #30/30. Refill sent to Dr Jaynee Eagles.  ?

## 2021-07-16 MED ORDER — ADDERALL XR 30 MG PO CP24: 30.0000 mg | ORAL_CAPSULE | Freq: Every day | ORAL | 0 refills | Status: DC

## 2021-09-06 ENCOUNTER — Other Ambulatory Visit: Payer: Self-pay | Admitting: Neurology

## 2021-09-06 DIAGNOSIS — I10 Essential (primary) hypertension: Secondary | ICD-10-CM

## 2021-09-06 DIAGNOSIS — E1169 Type 2 diabetes mellitus with other specified complication: Secondary | ICD-10-CM

## 2021-09-06 DIAGNOSIS — E785 Hyperlipidemia, unspecified: Secondary | ICD-10-CM

## 2021-09-06 MED ORDER — MOUNJARO 5 MG/0.5ML ~~LOC~~ SOAJ: 5.0000 mg | SUBCUTANEOUS | 3 refills | Status: DC

## 2021-09-08 ENCOUNTER — Telehealth: Payer: Self-pay | Admitting: *Deleted

## 2021-09-08 NOTE — Telephone Encounter (Signed)
Error

## 2021-09-09 ENCOUNTER — Other Ambulatory Visit: Payer: Self-pay | Admitting: Neurology

## 2021-09-09 DIAGNOSIS — F909 Attention-deficit hyperactivity disorder, unspecified type: Secondary | ICD-10-CM

## 2021-09-09 MED ORDER — ADDERALL XR 30 MG PO CP24: 30.0000 mg | ORAL_CAPSULE | Freq: Every day | ORAL | 0 refills | Status: DC

## 2021-09-16 ENCOUNTER — Other Ambulatory Visit: Payer: Self-pay | Admitting: *Deleted

## 2021-09-16 MED ORDER — WEGOVY 2.4 MG/0.75ML ~~LOC~~ SOAJ: 2.4000 mg | SUBCUTANEOUS | 3 refills | Status: DC

## 2021-09-30 NOTE — Therapy (Signed)
[facility] [address] [place], [place], [address] Phone: [phone]   Fax:  [phone]  Patient Details  Name: Martha Veneziano, MD MRN: [id number] Date of Birth: 1965/07/29 Referring Provider:  Crist Infante, MD  Encounter Date: 10/01/2021   OUTPATIENT PHYSICAL THERAPY LOWER EXTREMITY EVALUATION   Patient Name: Martha Bruhn, MD MRN: [id number] DOB:[birth year], 56 y.o., female Today's Date: 10/01/2021   PT End of Session - 10/01/21 1003     Visit Number 1    Number of Visits 12    Date for PT Re-Evaluation 11/12/21    Authorization Type [employer]    Progress Note Due on Visit 10    PT Start Time 0808    PT Stop Time 2836    PT Time Calculation (min) 46 min    Activity Tolerance Patient tolerated treatment well;Patient limited by pain    Behavior During Therapy [facility] for tasks assessed/performed             Past Medical History:  Diagnosis Date   Acute acalculous cholecystitis 12/04/2014   Acute cholecystitis 12/04/2014   Chondromalacia of knee    LEFT COMPARTMENT   Giardia    History of cervical dysplasia    History of gestational diabetes    History of uterine fibroid    Hypertension    Palpitations    Shortness of breath    Past Surgical History:  Procedure Laterality Date   CESAREAN SECTION  X2  11-16-2005   CHOLECYSTECTOMY N/A 12/04/2014   Procedure: LAPAROSCOPIC CHOLECYSTECTOMY;  Surgeon: Martha Ok, MD;  Location: [facility];  Service: General;  Laterality: N/A;   COLPOSCOPY     GYNECOLOGIC CRYOSURGERY  age 66   KNEE ARTHROSCOPY Right 05/25/2015   Procedure: RIGHT KNEE ARTHROSCOPY;  Surgeon: Martha Cancel, MD;  Location: [facility];  Service: Orthopedics;  Laterality: Right;   MYOMECTOMY ABDOMINAL APPROACH  12-29-2003   NEGATIVE SLEEP STUDY  2015   Patient Active Problem List   Diagnosis Date Noted   Gastroesophageal reflux disease 11/16/2020   Osteoarthritis of right knee 05/08/2019    Essential hypertension 05/25/2018   ADHD 01/06/2017   CTS (carpal tunnel syndrome) 01/06/2017   Asymptomatic microscopic hematuria 02/23/2016   Neck pain 12/28/2015   Anterior knee pain, right 05/21/2015   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 Crist Infante, MD  REFERRING PROVIDER: Derl Barrow, PA  REFERRING DIAG: [phone] (ICD-10-CM) - Unilateral primary osteoarthritis, left knee  THERAPY DIAG:  Other abnormalities of gait and mobility  Chronic pain of left knee  Muscle weakness (generalized)  Chronic pain of right knee  Rationale for Evaluation and Treatment Rehabilitation  ONSET DATE: 2023  SUBJECTIVE:   SUBJECTIVE STATEMENT:  New sxs: pain with initiation of gait both knees and with walking R medial knee pain. Injured L knee in Feb lifting luggage when I rotated my body to put the luggage on another surface. Feels popping/cracking medial knee both sides. Pt late to eval  PERTINENT HISTORY: [place] in 2014-2015 slipping in the  gym shower falling into knee valgus. There was no grip attaching the mat to the floor. Swelling both  knees. Saw MD for acute injury . Meniscus damaged on both sides R > L.She has recently been told she knees a total knee; she is ready for surgery but MD wants her to wait.  Has had sterioid injections, gel injections with last being in late June 2023.  Pain both knees upon locking in of knees after transfer  PAIN:  Are you having pain? Not currently but pain with walking and  descending stairs with pain getting up to 6-8/10   PRECAUTIONS: None  WEIGHT BEARING RESTRICTIONS No  FALLS:  Has patient fallen in last 6 months? No  LIVING ENVIRONMENT: Lives with: lives with their family Lives in: House/apartment Has following equipment at home: None  OCCUPATION: Physician  PLOF: Independent  PATIENT GOALS walk without pain   OBJECTIVE:   DIAGNOSTIC FINDINGS: 03/03/20 Xray R knee: Degenerative changes similar to that seen on previous exam in 2020.  PATIENT SURVEYS:  Not performed at evaluation due to time constraint  COGNITION:  Overall cognitive status: Within functional limits for tasks assessed     SENSATION: WFL  EDEMA:  none   POSTURE:  L quad activation with standing, R hip internal rotation with gait, increased WB L with gait  PALPATION: R patella laterally tracks with quad set, R patella mobility limited all directions, L patella mobility limited but medial/lateral more mobile than superior/inferior. L ITB/quad tighter with TTP L ITB. No extension lag with SLR  LOWER EXTREMITY ROM:  Passive ROM Right eval Left eval  Hip flexion [facility]   Hip extension [facility]  Hip abduction [facility]  Hip adduction [facility]  Hip internal rotation [facility] WFL  Hip external rotation [facility] WFL with discomfort reported and restriction palpated  Knee flexion [facility]  Knee extension [facility]  Ankle dorsiflexion [facility]  Ankle plantarflexion    Ankle inversion    Ankle eversion     (Blank rows = not tested) Knee AROM WNL; L hip flexion impaired and uncomfortable  LOWER EXTREMITY MMT:  MMT Right eval Left eval  Hip flexion 4+/5 2-/5  Hip extension 2+/5 2+/5  Hip abduction 4/5 4+/5  Hip adduction 4/5 2+/5  Hip internal rotation    Hip external rotation    Knee flexion 4+/5 4/5  Knee extension 4+/5 4/5  Ankle dorsiflexion 5/5 5/5  Ankle plantarflexion    Ankle inversion    Ankle eversion     (Blank rows = not tested)  LOWER EXTREMITY SPECIAL  TESTS:  Hip special tests: Saralyn Pilar (FABER) test: positive , Thomas test: positive , and Ober's test: positive  all positive on L. SLR + for Hamstring tightness bil LEs   GAIT: Distance walked: in gym Assistive device utilized: None Level of assistance: Complete Independence Comments: increased WB L with gait    TODAY'S TREATMENT: At eval: see pt education   PATIENT EDUCATION:  Education details: PT findings reviewed, PT POC reviewed, HEP issued Person educated: Patient Education method: Explanation, Demonstration, and Handouts Education comprehension: verbalized understanding and returned demonstration   HOME EXERCISE PROGRAM: Access Code: [email or web address] URL: [email or web address] Date: 10/01/2021 Prepared by: America Brown  Exercises - Seated Hamstring Stretch  - 1 x daily - 7 x weekly - 1 sets - 2 reps - 30 sec hold - Standing Hip Flexor Stretch  - 1 x daily - 7 x weekly - 1 sets - 2 reps -  30 sec hold - Supine ITB Stretch  - 1 x daily - 7 x weekly - 1 sets - 2 reps - 30 sec hold  ASSESSMENT:  CLINICAL IMPRESSION: Patient is a 56 y.o. female who was seen today for physical therapy evaluation and treatment for L knee pain (script for L LE but pt with deficits bil and impacting overall mobility). Special tests + on L during hip screening for hip impairment. Knee AROM WNL bil. Bil LE weakness noted along with patella mobility limited bil R > L. Pt would benefit from PT for LE strengthening bil, L hip flexibility, R hip internal rotator stretching, patella mobs bil, patella tracking correction bil. Pt is very interested in aquatic therapy. Pain limits patient's ability to be mobile at work and at home.   OBJECTIVE IMPAIRMENTS Abnormal gait, decreased activity tolerance, decreased endurance, decreased mobility, difficulty walking, decreased strength, impaired flexibility, and pain.   ACTIVITY LIMITATIONS sitting, standing, squatting, stairs, transfers, and locomotion  level  PARTICIPATION LIMITATIONS: community activity and occupation  [place] Past/current experiences are also affecting patient's functional outcome.   REHAB POTENTIAL: Good  CLINICAL DECISION MAKING: Evolving/moderate complexity  EVALUATION COMPLEXITY: Moderate   GOALS: Goals reviewed with patient? Yes  SHORT TERM GOALS: Target date: 10/22/2021  Pt will be indep with initial HEP to assist with bil knee pain management  for mobility at work Baseline:initiated at eval Goal status: INITIAL  2.  Pt will report 25% improvement in L knee pain with walking Baseline: 0% Goal status: INITIAL    LONG TERM GOALS: Target date: 11/12/2021   Pt will be able to walk x 15 min </=3/10 bil knee pain for neighborhood walking Baseline: 6-8/10 x 15 min Goal status: INITIAL  2.  Pt will be able to walk at work with 75% less difficulty Baseline: avoids activities at work, 5/10 Goal status: INITIAL  3.  Pt will be able to descend stairs  with </= 3/10 Baseline: has to hold rail and modify how she walks down stairs, up to 8/10 Goal status: INITIAL  4.  Pt will be able to walk without holding onto a cart to shop Baseline: needs to hold a cart Goal status: INITIAL  5.  Pt will demo improvement in bil LE strengthening >/=1 MMT grade to assist with overall mobility.  Baseline:  MMT Right eval Left eval  Hip flexion 4+/5 2-/5  Hip extension 2+/5 2+/5  Hip abduction 4/5 4+/5  Hip adduction 4/5 2+/5  Hip internal rotation    Hip external rotation    Knee flexion 4+/5 4/5  Knee extension 4+/5 4/5  Ankle dorsiflexion 5/5 5/5   Goal status: INITIAL     PLAN: PT FREQUENCY: 2x/week  PT DURATION: 6 weeks  PLANNED INTERVENTIONS: Therapeutic exercises, Therapeutic activity, Neuromuscular re-education, Gait training, Patient/Family education, Stair training, Aquatic Therapy, Dry Needling, Cryotherapy, Moist heat, Taping, Vasopneumatic device, Ultrasound, Manual therapy, and  Re-evaluation  PLAN FOR NEXT SESSION: review HEP, patella mobility bil LEs, bil LE strengthening, L hip flexibility therex, stretch R hip internal rotators. [facility] [place], [place], [address] Phone: [phone]   Fax:  [phone]

## 2021-10-01 ENCOUNTER — Ambulatory Visit (HOSPITAL_BASED_OUTPATIENT_CLINIC_OR_DEPARTMENT_OTHER): Payer: PRIVATE HEALTH INSURANCE | Attending: Student | Admitting: Rehabilitative and Restorative Service Providers"

## 2021-10-01 ENCOUNTER — Other Ambulatory Visit: Payer: Self-pay

## 2021-10-01 ENCOUNTER — Encounter (HOSPITAL_BASED_OUTPATIENT_CLINIC_OR_DEPARTMENT_OTHER): Payer: Self-pay | Admitting: Rehabilitative and Restorative Service Providers"

## 2021-10-01 DIAGNOSIS — G8929 Other chronic pain: Secondary | ICD-10-CM | POA: Insufficient documentation

## 2021-10-01 DIAGNOSIS — M1712 Unilateral primary osteoarthritis, left knee: Secondary | ICD-10-CM | POA: Diagnosis not present

## 2021-10-01 DIAGNOSIS — M25562 Pain in left knee: Secondary | ICD-10-CM | POA: Diagnosis not present

## 2021-10-01 DIAGNOSIS — R2689 Other abnormalities of gait and mobility: Secondary | ICD-10-CM | POA: Insufficient documentation

## 2021-10-01 DIAGNOSIS — M6281 Muscle weakness (generalized): Secondary | ICD-10-CM | POA: Diagnosis not present

## 2021-10-01 DIAGNOSIS — M25561 Pain in right knee: Secondary | ICD-10-CM | POA: Insufficient documentation

## 2021-10-08 ENCOUNTER — Encounter (HOSPITAL_BASED_OUTPATIENT_CLINIC_OR_DEPARTMENT_OTHER): Payer: Self-pay | Admitting: Physical Therapy

## 2021-10-08 ENCOUNTER — Ambulatory Visit (HOSPITAL_BASED_OUTPATIENT_CLINIC_OR_DEPARTMENT_OTHER): Payer: PRIVATE HEALTH INSURANCE | Admitting: Physical Therapy

## 2021-10-08 DIAGNOSIS — M6281 Muscle weakness (generalized): Secondary | ICD-10-CM

## 2021-10-08 DIAGNOSIS — R2689 Other abnormalities of gait and mobility: Secondary | ICD-10-CM

## 2021-10-08 DIAGNOSIS — M1712 Unilateral primary osteoarthritis, left knee: Secondary | ICD-10-CM | POA: Diagnosis not present

## 2021-10-08 DIAGNOSIS — G8929 Other chronic pain: Secondary | ICD-10-CM

## 2021-10-08 NOTE — Therapy (Signed)
[facility] [address] 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 Date of Birth: Nov 09, 1965 Referring Provider:  Crist Infante, MD  Encounter Date: 10/08/2021   OUTPATIENT PHYSICAL THERAPY LOWER EXTREMITY TREATMENT   Patient Name: Martha Ottaway, MD MRN: [id number] DOB:[birth year], 56 y.o., female Today's Date: 10/08/2021   PT End of Session - 10/08/21 1506     Visit Number 2    Number of Visits 12    Date for PT Re-Evaluation 11/12/21    Authorization Type [employer]    Progress Note Due on Visit 10    PT Start Time 1432    PT Stop Time 1515    PT Time Calculation (min) 43 min    Activity Tolerance Patient tolerated treatment well    Behavior During Therapy [facility] for tasks assessed/performed              Past Medical History:  Diagnosis Date   Acute acalculous cholecystitis 12/04/2014   Acute cholecystitis 12/04/2014   Chondromalacia of knee    LEFT COMPARTMENT   Giardia    History of cervical dysplasia    History of gestational diabetes    History of uterine fibroid    Hypertension    Palpitations    Shortness of breath    Past Surgical History:  Procedure Laterality Date   CESAREAN SECTION  X2  11-16-2005   CHOLECYSTECTOMY N/A 12/04/2014   Procedure: LAPAROSCOPIC CHOLECYSTECTOMY;  Surgeon: Ralene Ok, MD;  Location: [place];  Service: General;  Laterality: N/A;   COLPOSCOPY     GYNECOLOGIC CRYOSURGERY  age 27   KNEE ARTHROSCOPY Right 05/25/2015   Procedure: RIGHT KNEE ARTHROSCOPY;  Surgeon: Paralee Cancel, MD;  Location: [place];  Service: Orthopedics;  Laterality: Right;   MYOMECTOMY ABDOMINAL APPROACH  12-29-2003   NEGATIVE SLEEP STUDY  2015   Patient Active Problem List   Diagnosis Date Noted   Gastroesophageal reflux disease 11/16/2020   Osteoarthritis of right knee 05/08/2019   Essential  hypertension 05/25/2018   ADHD 01/06/2017   CTS (carpal tunnel syndrome) 01/06/2017   Asymptomatic microscopic hematuria 02/23/2016   Neck pain 12/28/2015   Anterior knee pain, right 05/21/2015   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 Crist Infante, MD  REFERRING PROVIDER: Derl Barrow, PA  REFERRING DIAG: [phone] (ICD-10-CM) - Unilateral primary osteoarthritis, left knee  THERAPY DIAG:  Other abnormalities of gait and mobility  Chronic pain of left knee  Muscle weakness (generalized)  Chronic pain of right knee  Rationale for Evaluation and Treatment Rehabilitation  ONSET DATE: 2023  SUBJECTIVE:   SUBJECTIVE STATEMENT:  I'm sore today, I tried walking over grass after I left my office today and it did not go well. Hips are sore I feel like I cannot stabilize.   PERTINENT HISTORY: [place] in 2014-2015 slipping in the gym shower falling into knee valgus. There was no grip attaching the mat to the floor. Swelling  both  knees. Saw MD for acute injury . Meniscus damaged on both sides R > L.She has recently been told she knees a total knee; she is ready for surgery but MD wants her to wait.  Has had sterioid injections, gel injections with last being in late June 2023.  Pain both knees upon locking in of knees after transfer  PAIN:  Are you having pain? Not currently but pain with walking and descending stairs with pain getting up to 6-8/10   PRECAUTIONS: None  WEIGHT BEARING RESTRICTIONS No  FALLS:  Has  patient fallen in last 6 months? No  LIVING ENVIRONMENT: Lives with: lives with their family Lives in: House/apartment Has following equipment at home: None  OCCUPATION: Physician  PLOF: Independent  PATIENT GOALS walk without pain  TODAY'S TREATMENT 10/08/21  FOTO 50  Bridges 1x15 red TB 5 second holds at the top Sidelying clams 1x15 B red TB  Prone hip extensions 1x10 B red TB  Walking bridges 2x5 B  SLR with ER 1x10 B for VMO activation   Patella mobility B knees all directions as tolerated (L much less immobile than R)  STS with red TB around knees 1x10   HS and piriformis stretches 2x30 seconds B each      OBJECTIVE:   DIAGNOSTIC FINDINGS: 03/03/20 Xray R knee: Degenerative changes similar to that seen on previous exam in 2020.  PATIENT SURVEYS:  Not performed at evaluation due to time constraint  COGNITION:  Overall cognitive status: Within functional limits for tasks assessed     SENSATION: WFL  EDEMA:  none   POSTURE:  L quad activation with standing, R hip internal rotation with gait, increased WB L with gait  PALPATION: R patella laterally tracks with quad set, R patella mobility limited all directions, L patella mobility limited but medial/lateral more mobile than superior/inferior. L ITB/quad tighter with TTP L ITB. No extension lag with SLR  LOWER EXTREMITY ROM:  Passive ROM Right eval Left eval  Hip flexion [facility]   Hip extension [facility] [address] [facility]  Hip abduction [facility]  Hip adduction [facility]  Hip internal rotation [facility] WFL  Hip external rotation [facility] WFL with discomfort reported and restriction palpated  Knee flexion [facility]  Knee extension [facility]  Ankle dorsiflexion [facility]  Ankle plantarflexion    Ankle inversion    Ankle eversion     (Blank rows = not tested) Knee AROM WNL; L hip flexion impaired and uncomfortable  LOWER EXTREMITY MMT:  MMT Right eval Left eval  Hip flexion 4+/5 2-/5  Hip extension 2+/5 2+/5  Hip abduction 4/5  4+/5  Hip adduction 4/5 2+/5  Hip internal rotation    Hip external rotation    Knee flexion 4+/5 4/5  Knee extension 4+/5 4/5  Ankle dorsiflexion 5/5 5/5  Ankle plantarflexion    Ankle inversion    Ankle eversion     (Blank rows = not tested)  LOWER EXTREMITY SPECIAL TESTS:  Hip special tests: Saralyn Pilar (FABER) test: positive , Thomas test: positive , and Ober's test: positive  all positive on L. SLR + for Hamstring tightness bil LEs   GAIT: Distance walked: in gym Assistive device utilized: None Level of assistance: Complete Independence Comments: increased WB L with gait    TODAY'S TREATMENT: At eval: see pt education   PATIENT EDUCATION:  Education details: PT findings reviewed, PT POC reviewed, HEP issued Person educated: Patient Education method: Explanation, Demonstration, and Handouts Education comprehension: verbalized  understanding and returned demonstration   HOME EXERCISE PROGRAM: Access Code: [email or web address] URL: [email or web address] Date: 10/01/2021 Prepared by: America Brown  Exercises - Seated Hamstring Stretch  - 1 x daily - 7 x weekly - 1 sets - 2 reps - 30 sec hold - Standing Hip Flexor Stretch  - 1 x daily - 7 x weekly - 1 sets - 2 reps - 30 sec hold - Supine ITB Stretch  - 1 x daily - 7 x weekly - 1 sets - 2 reps - 30 sec hold  ASSESSMENT:  CLINICAL IMPRESSION:  Mr. Trantham arrives today doing well, but still having issues with knee pain and feelings of hip instability. Completed FOTO, then focused on proximal strength and flexibility in hips today as tolerated. Did well, we will continue to progress as tolerated.    OBJECTIVE IMPAIRMENTS Abnormal gait, decreased activity tolerance, decreased endurance, decreased mobility, difficulty walking, decreased strength, impaired flexibility, and pain.   ACTIVITY LIMITATIONS sitting, standing, squatting, stairs, transfers, and locomotion level  PARTICIPATION LIMITATIONS: community activity and  occupation  [place] Past/current experiences are also affecting patient's functional outcome.   REHAB POTENTIAL: Good  CLINICAL DECISION MAKING: Evolving/moderate complexity  EVALUATION COMPLEXITY: Moderate   GOALS: Goals reviewed with patient? Yes  SHORT TERM GOALS: Target date: 10/29/2021  Pt will be indep with initial HEP to assist with bil knee pain management  for mobility at work Baseline:initiated at eval Goal status: INITIAL  2.  Pt will report 25% improvement in L knee pain with walking Baseline: 0% Goal status: INITIAL    LONG TERM GOALS: Target date: 11/19/2021   Pt will be able to walk x 15 min </=3/10 bil knee pain for neighborhood walking Baseline: 6-8/10 x 15 min Goal status: INITIAL  2.  Pt will be able to walk at work with 75% less difficulty Baseline: avoids activities at work, 5/10 Goal status: INITIAL  3.  Pt will be able to descend stairs  with </= 3/10 Baseline: has to hold rail and modify how she walks down stairs, up to 8/10 Goal status: INITIAL  4.  Pt will be able to walk without holding onto a cart to shop Baseline: needs to hold a cart Goal status: INITIAL  5.  Pt will demo improvement in bil LE strengthening >/=1 MMT grade to assist with overall mobility.  Baseline:  MMT Right eval Left eval  Hip flexion 4+/5 2-/5  Hip extension 2+/5 2+/5  Hip abduction 4/5 4+/5  Hip adduction 4/5 2+/5  Hip internal rotation    Hip external rotation    Knee flexion 4+/5 4/5  Knee extension 4+/5 4/5  Ankle dorsiflexion 5/5 5/5   Goal status: INITIAL     PLAN: PT FREQUENCY: 2x/week  PT DURATION: 6 weeks  PLANNED INTERVENTIONS: Therapeutic exercises, Therapeutic activity, Neuromuscular re-education, Gait training, Patient/Family education, Stair training, Aquatic Therapy, Dry Needling, Cryotherapy, Moist heat, Taping, Vasopneumatic device, Ultrasound, Manual therapy, and Re-evaluation  PLAN FOR NEXT SESSION: review HEP, patella  mobility bil LEs, bil LE strengthening, L hip flexibility therex, stretch R hip internal rotators.   Ann Lions PT DPT PN2  10/08/2021, 3:15 PM     [facility] [address] [place], [place], [address] Phone: [phone]   Fax:  [phone]

## 2021-10-18 ENCOUNTER — Other Ambulatory Visit: Payer: Self-pay | Admitting: Neurology

## 2021-10-18 DIAGNOSIS — F909 Attention-deficit hyperactivity disorder, unspecified type: Secondary | ICD-10-CM

## 2021-10-20 MED ORDER — ADDERALL XR 30 MG PO CP24: 30.0000 mg | ORAL_CAPSULE | Freq: Every day | ORAL | 0 refills | Status: DC

## 2021-10-22 ENCOUNTER — Ambulatory Visit (HOSPITAL_BASED_OUTPATIENT_CLINIC_OR_DEPARTMENT_OTHER): Payer: PRIVATE HEALTH INSURANCE | Attending: Student | Admitting: Physical Therapy

## 2021-10-22 ENCOUNTER — Encounter (HOSPITAL_BASED_OUTPATIENT_CLINIC_OR_DEPARTMENT_OTHER): Payer: Self-pay | Admitting: Physical Therapy

## 2021-10-22 DIAGNOSIS — R2689 Other abnormalities of gait and mobility: Secondary | ICD-10-CM | POA: Diagnosis present

## 2021-10-22 DIAGNOSIS — M6281 Muscle weakness (generalized): Secondary | ICD-10-CM | POA: Insufficient documentation

## 2021-10-22 DIAGNOSIS — M25562 Pain in left knee: Secondary | ICD-10-CM | POA: Diagnosis present

## 2021-10-22 DIAGNOSIS — M25561 Pain in right knee: Secondary | ICD-10-CM | POA: Insufficient documentation

## 2021-10-22 DIAGNOSIS — G8929 Other chronic pain: Secondary | ICD-10-CM | POA: Insufficient documentation

## 2021-10-22 NOTE — Therapy (Signed)
[facility] [address] [place], [place], [address] Phone: [phone]   Fax:  [phone]  Patient Details  Name: Supriya Beaston, MD MRN: [id number] Date of Birth: 21-Sep-1965 Referring Provider:  Crist Infante, MD  Encounter Date: 10/22/2021   OUTPATIENT PHYSICAL THERAPY LOWER EXTREMITY TREATMENT   Patient Name: Martha Knezevic, MD MRN: [id number] DOB:Apr 15, 1965, 56 y.o., female Today's Date: 10/22/2021   PT End of Session - 10/22/21 1452     Visit Number 3    Number of Visits 12    Date for PT Re-Evaluation 11/12/21    PT Start Time 1430    PT Stop Time 1512    PT Time Calculation (min) 42 min    Activity Tolerance Patient tolerated treatment well    Behavior During Therapy [facility] for tasks assessed/performed              Past Medical History:  Diagnosis Date   Acute acalculous cholecystitis 12/04/2014   Acute cholecystitis 12/04/2014   Chondromalacia of knee    LEFT COMPARTMENT   Giardia    History of cervical dysplasia    History of gestational diabetes    History of uterine fibroid    Hypertension    Palpitations    Shortness of breath    Past Surgical History:  Procedure Laterality Date   CESAREAN SECTION  X2  11-16-2005   CHOLECYSTECTOMY N/A 12/04/2014   Procedure: LAPAROSCOPIC CHOLECYSTECTOMY;  Surgeon: Ralene Ok, MD;  Location: [place];  Service: General;  Laterality: N/A;   COLPOSCOPY     GYNECOLOGIC CRYOSURGERY  age 27   KNEE ARTHROSCOPY Right 05/25/2015   Procedure: RIGHT KNEE ARTHROSCOPY;  Surgeon: Paralee Cancel, MD;  Location: [place];  Service: Orthopedics;  Laterality: Right;   MYOMECTOMY ABDOMINAL APPROACH  12-29-2003   NEGATIVE SLEEP STUDY  2015   Patient Active Problem List   Diagnosis Date Noted   Gastroesophageal reflux disease 11/16/2020   Osteoarthritis of right knee 05/08/2019   Essential hypertension 05/25/2018   ADHD 01/06/2017   CTS (carpal tunnel syndrome)  01/06/2017   Asymptomatic microscopic hematuria 02/23/2016   Neck pain 12/28/2015   Anterior knee pain, right 05/21/2015   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 Crist Infante, MD  REFERRING PROVIDER: Derl Barrow, PA  REFERRING DIAG: [phone] (ICD-10-CM) - Unilateral primary osteoarthritis, left knee  THERAPY DIAG:  Other abnormalities of gait and mobility  Chronic pain of left knee  Muscle weakness (generalized)  Chronic pain of right knee  Rationale for Evaluation and Treatment Rehabilitation  ONSET DATE: 2023  SUBJECTIVE:   SUBJECTIVE STATEMENT:  The patient reports that her hip soreness has improved since the last visit. She is not having knee pain today.  PERTINENT HISTORY: [place] in 2014-2015 slipping in the gym shower falling into knee valgus. There was no grip attaching the mat to the floor. Swelling both  knees. Saw MD for acute injury . Meniscus damaged on both sides R > L.She has recently been told she knees a total  knee; she is ready for surgery but MD wants her to wait.  Has had sterioid injections, gel injections with last being in late June 2023.  Pain both knees upon locking in of knees after transfer  PAIN:  Are you having pain? Not currently but pain with walking and descending stairs with pain getting up to 6-8/10   PRECAUTIONS: None  WEIGHT BEARING RESTRICTIONS No  FALLS:  Has patient fallen in last 6 months? No  LIVING ENVIRONMENT: Lives with: lives with their family Lives in:  House/apartment Has following equipment at home: None  OCCUPATION: Physician  PLOF: Independent  PATIENT GOALS walk without pain  TODAY'S TREATMENT  8/4 Reviewed and updated HEP   Bridges 1x15 green TB 5 second holds at the top SL SLR 2x10 each side  SLR 2x10  Quad set 2x10   Standing heel raise x20  Standing slow march with grade UE support Hip extension 2x10     7/21  FOTO 40  Bridges 1x15 red TB 5 second holds at the top Sidelying clams 1x15 B red TB  Prone hip extensions 1x10 B red TB  Walking bridges 2x5 B  SLR with ER 1x10 B for VMO activation   Patella mobility B knees all directions as tolerated (L much less immobile than R)  STS with red TB around knees 1x10   HS and piriformis stretches 2x30 seconds B each      OBJECTIVE:   DIAGNOSTIC FINDINGS: 03/03/20 Xray R knee: Degenerative changes similar to that seen on previous exam in 2020.  PATIENT SURVEYS:  Not performed at evaluation due to time constraint  COGNITION:  Overall cognitive status: Within functional limits for tasks assessed     SENSATION: WFL  EDEMA:  none   POSTURE:  L quad activation with standing, R hip internal rotation with gait, increased WB L with gait  PALPATION: R patella laterally tracks with quad set, R patella mobility limited all directions, L patella mobility limited but medial/lateral more mobile than superior/inferior. L ITB/quad tighter with TTP L ITB. No extension lag with SLR  LOWER EXTREMITY ROM:  Passive ROM Right eval Left eval  Hip flexion [facility]   Hip extension [facility]  Hip abduction [facility]  Hip adduction [facility]  Hip internal rotation [facility] WFL  Hip external rotation [facility] WFL with discomfort reported and restriction palpated  Knee flexion [facility]  Knee extension [facility]  Ankle dorsiflexion [facility]  Ankle plantarflexion    Ankle inversion    Ankle eversion     (Blank rows = not tested) Knee AROM WNL; L hip flexion impaired and  uncomfortable  LOWER EXTREMITY MMT:  MMT Right eval Left eval  Hip flexion 4+/5 2-/5  Hip extension 2+/5 2+/5  Hip abduction 4/5 4+/5  Hip adduction 4/5 2+/5  Hip internal rotation    Hip external rotation    Knee flexion 4+/5 4/5  Knee extension 4+/5 4/5  Ankle dorsiflexion 5/5 5/5  Ankle plantarflexion    Ankle inversion    Ankle eversion     (Blank rows = not tested)  LOWER EXTREMITY SPECIAL TESTS:  Hip special tests: Saralyn Pilar (FABER) test: positive , Thomas test: positive , and Ober's test: positive  all positive on L. SLR + for Hamstring tightness bil LEs   GAIT: Distance walked: in gym Assistive device utilized: None Level of assistance: Complete Independence Comments: increased WB L with gait    TODAY'S TREATMENT: At eval: see pt education  PATIENT EDUCATION:  Education details: PT findings reviewed, PT POC reviewed, HEP issued Person educated: Patient Education method: Explanation, Demonstration, and Handouts Education comprehension: verbalized understanding and returned demonstration   HOME EXERCISE PROGRAM: Access Code: [email or web address] URL: [email or web address] Date: 10/01/2021 Prepared by: America Brown  Exercises - Seated Hamstring Stretch  - 1 x daily - 7 x weekly - 1 sets - 2 reps - 30 sec hold - Standing Hip Flexor Stretch  - 1 x daily - 7 x weekly - 1 sets - 2 reps - 30 sec hold - Supine ITB Stretch  - 1 x daily - 7 x weekly - 1 sets - 2 reps - 30 sec hold  ASSESSMENT:  CLINICAL IMPRESSION: The patient was given an updated HEP with things to work on at home. She tolerated exercises. She felt like everything was pretty easy. She was advised we can make it harder but lets see how she responds to the treatment. We focused on supine and standing exercises. We reviewed the reasoning behind these particular exercises. These will be most of the exercises she is given after the procedure.    OBJECTIVE IMPAIRMENTS Abnormal gait, decreased  activity tolerance, decreased endurance, decreased mobility, difficulty walking, decreased strength, impaired flexibility, and pain.   ACTIVITY LIMITATIONS sitting, standing, squatting, stairs, transfers, and locomotion level  PARTICIPATION LIMITATIONS: community activity and occupation  [place] Past/current experiences are also affecting patient's functional outcome.   REHAB POTENTIAL: Good  CLINICAL DECISION MAKING: Evolving/moderate complexity  EVALUATION COMPLEXITY: Moderate   GOALS: Goals reviewed with patient? Yes  SHORT TERM GOALS: Target date: 11/12/2021  Pt will be indep with initial HEP to assist with bil knee pain management  for mobility at work Baseline:initiated at eval Goal status: INITIAL  2.  Pt will report 25% improvement in L knee pain with walking Baseline: 0% Goal status: INITIAL    LONG TERM GOALS: Target date: 12/03/2021   Pt will be able to walk x 15 min </=3/10 bil knee pain for neighborhood walking Baseline: 6-8/10 x 15 min Goal status: INITIAL  2.  Pt will be able to walk at work with 75% less difficulty Baseline: avoids activities at work, 5/10 Goal status: INITIAL  3.  Pt will be able to descend stairs  with </= 3/10 Baseline: has to hold rail and modify how she walks down stairs, up to 8/10 Goal status: INITIAL  4.  Pt will be able to walk without holding onto a cart to shop Baseline: needs to hold a cart Goal status: INITIAL  5.  Pt will demo improvement in bil LE strengthening >/=1 MMT grade to assist with overall mobility.  Baseline:  MMT Right eval Left eval  Hip flexion 4+/5 2-/5  Hip extension 2+/5 2+/5  Hip abduction 4/5 4+/5  Hip adduction 4/5 2+/5  Hip internal rotation    Hip external rotation    Knee flexion 4+/5 4/5  Knee extension 4+/5 4/5  Ankle dorsiflexion 5/5 5/5   Goal status: INITIAL     PLAN: PT FREQUENCY: 2x/week  PT DURATION: 6 weeks  PLANNED INTERVENTIONS: Therapeutic exercises,  Therapeutic activity, Neuromuscular re-education, Gait training, Patient/Family education, Stair training, Aquatic Therapy, Dry Needling, Cryotherapy, Moist heat, Taping, Vasopneumatic device, Ultrasound, Manual therapy, and Re-evaluation  PLAN FOR NEXT SESSION: review HEP, patella mobility bil LEs, bil LE strengthening, L hip flexibility therex, stretch R hip internal rotators.   Carolyne Littles PT DPT  10/22/2021, 3:33 PM     [facility]  [place], [place], [address] Phone: [phone]   Fax:  [phone]

## 2021-10-29 ENCOUNTER — Encounter (HOSPITAL_BASED_OUTPATIENT_CLINIC_OR_DEPARTMENT_OTHER): Payer: PRIVATE HEALTH INSURANCE | Admitting: Physical Therapy

## 2021-11-12 ENCOUNTER — Ambulatory Visit (HOSPITAL_BASED_OUTPATIENT_CLINIC_OR_DEPARTMENT_OTHER): Payer: PRIVATE HEALTH INSURANCE | Admitting: Physical Therapy

## 2021-11-12 ENCOUNTER — Encounter (HOSPITAL_BASED_OUTPATIENT_CLINIC_OR_DEPARTMENT_OTHER): Payer: Self-pay | Admitting: Physical Therapy

## 2021-11-12 DIAGNOSIS — R2689 Other abnormalities of gait and mobility: Secondary | ICD-10-CM

## 2021-11-12 DIAGNOSIS — G8929 Other chronic pain: Secondary | ICD-10-CM

## 2021-11-12 DIAGNOSIS — M6281 Muscle weakness (generalized): Secondary | ICD-10-CM

## 2021-11-12 NOTE — Therapy (Signed)
[facility] [address] [place], [place], [address] Phone: [phone]   Fax:  [phone]  Patient Details  Name: Martha Estala, MD MRN: [id number] Date of Birth: 1965-11-29 Referring Provider:  Crist Infante, MD  Encounter Date: 11/12/2021   OUTPATIENT PHYSICAL THERAPY LOWER EXTREMITY TREATMENT   Patient Name: Martha Lecrone, MD MRN: [id number] DOB:06/06/1965, 56 y.o., female Today's Date: 11/12/2021   PT End of Session - 11/12/21 1442     Visit Number 4    Number of Visits 12    Date for PT Re-Evaluation 11/12/21    Authorization Type [employer]    Progress Note Due on Visit 10    PT Start Time 1348    PT Stop Time 1428    PT Time Calculation (min) 40 min    Activity Tolerance Patient tolerated treatment well    Behavior During Therapy [facility] for tasks assessed/performed               Past Medical History:  Diagnosis Date   Acute acalculous cholecystitis 12/04/2014   Acute cholecystitis 12/04/2014   Chondromalacia of knee    LEFT COMPARTMENT   Giardia    History of cervical dysplasia    History of gestational diabetes    History of uterine fibroid    Hypertension    Palpitations    Shortness of breath    Past Surgical History:  Procedure Laterality Date   CESAREAN SECTION  X2  11-16-2005   CHOLECYSTECTOMY N/A 12/04/2014   Procedure: LAPAROSCOPIC CHOLECYSTECTOMY;  Surgeon: Ralene Ok, MD;  Location: [facility];  Service: General;  Laterality: N/A;   COLPOSCOPY     GYNECOLOGIC CRYOSURGERY  age 72   KNEE ARTHROSCOPY Right 05/25/2015   Procedure: RIGHT KNEE ARTHROSCOPY;  Surgeon: Paralee Cancel, MD;  Location: [facility];  Service: Orthopedics;  Laterality: Right;   MYOMECTOMY ABDOMINAL APPROACH  12-29-2003   NEGATIVE SLEEP STUDY  2015   Patient Active Problem List   Diagnosis Date Noted   Gastroesophageal reflux disease 11/16/2020   Osteoarthritis of right knee 05/08/2019   Essential  hypertension 05/25/2018   ADHD 01/06/2017   CTS (carpal tunnel syndrome) 01/06/2017   Asymptomatic microscopic hematuria 02/23/2016   Neck pain 12/28/2015   Anterior knee pain, right 05/21/2015   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 Crist Infante, MD  REFERRING PROVIDER: Derl Barrow, PA  REFERRING DIAG: [phone] (ICD-10-CM) - Unilateral primary osteoarthritis, left knee  THERAPY DIAG:  Other abnormalities of gait and mobility  Chronic pain of left knee  Muscle weakness (generalized)  Chronic pain of right knee  Rationale for Evaluation and Treatment Rehabilitation  ONSET DATE: 2023  SUBJECTIVE:   SUBJECTIVE STATEMENT:   I talked with my orthopedist, we are planning for TKR surgery around Thanksgiving. Need to build myself up strength wise before then. Would really like to start water therapy.   PERTINENT HISTORY: [place] in 2014-2015 slipping in the gym shower falling into knee valgus. There was no grip attaching the mat to the  floor. Swelling both  knees. Saw MD for acute injury . Meniscus damaged on both sides R > L.She has recently been told she knees a total knee; she is ready for surgery but MD wants her to wait.  Has had sterioid injections, gel injections with last being in late June 2023.  Pain both knees upon locking in of knees after transfer  PAIN:  Are you having pain? Not currently but pain with walking and descending stairs with pain getting up to 6-8/10   PRECAUTIONS: None  WEIGHT  BEARING RESTRICTIONS No  FALLS:  Has patient fallen in last 6 months? No  LIVING ENVIRONMENT: Lives with: lives with their family Lives in: House/apartment Has following equipment at home: None  OCCUPATION: Physician  PLOF: Independent  PATIENT GOALS walk without pain  TODAY'S TREATMENT  8/25  Nustep L6x6 minutes BLEs only  Objective measures and recertification- education as appropriate   Hip hikes x10 green TB  U bridge x10 B  Walking bridges x10   8/4 Reviewed and updated HEP   Bridges 1x15 green TB 5 second holds at the top SL SLR 2x10 each side  SLR 2x10  Quad set 2x10   Standing heel raise x20  Standing slow march with grade UE support Hip extension 2x10     7/21  FOTO 40  Bridges 1x15 red TB 5 second holds at the top Sidelying clams 1x15 B red TB  Prone hip extensions 1x10 B red TB  Walking bridges 2x5 B  SLR with ER 1x10 B for VMO activation   Patella mobility B knees all directions as tolerated (L much less immobile than R)  STS with red TB around knees 1x10   HS and piriformis stretches 2x30 seconds B each      OBJECTIVE:   DIAGNOSTIC FINDINGS: 03/03/20 Xray R knee: Degenerative changes similar to that seen on previous exam in 2020.  PATIENT SURVEYS:  Not performed at evaluation due to time constraint  COGNITION:  Overall cognitive status: Within functional limits for tasks assessed     SENSATION: WFL  EDEMA:  none   POSTURE:  L quad activation with standing, R hip internal rotation with gait, increased WB L with gait  PALPATION: R patella laterally tracks with quad set, R patella mobility limited all directions, L patella mobility limited but medial/lateral more mobile than superior/inferior. L ITB/quad tighter with TTP L ITB. No extension lag with SLR  LOWER EXTREMITY ROM:  Passive ROM Right eval Left eval  Hip flexion [facility]   Hip extension [facility]  Hip abduction [facility]  Hip adduction [facility]  Hip internal  rotation [facility] WFL  Hip external rotation [facility] WFL with discomfort reported and restriction palpated  Knee flexion [facility]  Knee extension [facility]  Ankle dorsiflexion [facility]  Ankle plantarflexion    Ankle inversion    Ankle eversion     (Blank rows = not tested) Knee AROM WNL; L hip flexion impaired and uncomfortable  LOWER EXTREMITY MMT:  MMT Right eval Left eval R 8/25 L 8/25  Hip flexion 4+/5 2-/5 5 4-  Hip extension 2+/5 2+/5 3- 3-  Hip abduction 4/5 4+/5 4+ 5  Hip adduction 4/5 2+/5    Hip internal rotation      Hip external rotation      Knee flexion 4+/5 4/5 4+ 4+  Knee extension 4+/5 4/'5 5 5  ' Ankle dorsiflexion 5/5 5/5    Ankle plantarflexion  Ankle inversion      Ankle eversion       (Blank rows = not tested)  LOWER EXTREMITY SPECIAL TESTS:  Hip special tests: Saralyn Pilar (FABER) test: positive , Thomas test: positive , and Ober's test: positive  all positive on L. SLR + for Hamstring tightness bil LEs   GAIT: Distance walked: in gym Assistive device utilized: None Level of assistance: Complete Independence Comments: increased WB L with gait    TODAY'S TREATMENT: At eval: see pt education   PATIENT EDUCATION:  Education details: PT findings reviewed, PT POC reviewed, HEP issued Person educated: Patient Education method: Explanation, Demonstration, and Handouts Education comprehension: verbalized understanding and returned demonstration   HOME EXERCISE PROGRAM: Access Code: [email or web address] URL: [email or web address] Date: 10/01/2021 Prepared by: America Brown  Exercises - Seated Hamstring Stretch  - 1 x daily - 7 x weekly - 1 sets - 2 reps - 30 sec hold - Standing Hip Flexor Stretch  - 1 x daily - 7 x weekly - 1 sets - 2 reps - 30 sec hold - Supine ITB Stretch  - 1 x daily - 7 x weekly - 1 sets - 2 reps - 30 sec hold  ASSESSMENT:  CLINICAL IMPRESSION:  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 Objective measures are all improving, she now has TKR surgery planned in November. Would like to try water therapy- she is making great progress, this would likely be very beneficial to help improve tolerance to CKC strengthening and enhance readiness for surgery.   OBJECTIVE IMPAIRMENTS Abnormal gait, decreased activity tolerance, decreased endurance, decreased mobility, difficulty walking, decreased strength, impaired flexibility, and pain.   ACTIVITY LIMITATIONS sitting, standing, squatting, stairs, transfers, and locomotion level  PARTICIPATION LIMITATIONS: community activity and occupation  [employer] Past/current experiences are also affecting patient's functional outcome.   REHAB POTENTIAL: Good  CLINICAL DECISION MAKING: Evolving/moderate complexity  EVALUATION COMPLEXITY: Moderate   GOALS: Goals reviewed with patient? Yes  SHORT TERM GOALS: Target date: 12/03/2021  Pt will be indep with initial HEP to assist with bil knee pain management  for mobility at work Baseline:initiated at eval Goal status: MET  2.  Pt will report 25% improvement in L knee pain with walking Baseline: 8/25- 25% improvement L, R still painful  Goal status: MET    LONG TERM GOALS: Target date: 12/24/2021   Pt will be able to walk x 15 min </=3/10 bil knee pain for neighborhood walking Baseline: 8/25- endurance is better pain is still a problem  Goal status: IN PROGRESS  2.  Pt will be able to walk at work with 75% less difficulty Baseline: 8/25- but tends to cruise around on rolling chair Goal status: MET  3.  Pt will be able to descend stairs  with </= 3/10 Baseline:  Goal status: MET  4.  Pt will be able to walk without holding onto a cart to shop Baseline:  Goal status: MET  5.  Pt will demo improvement in bil LE strengthening >/=1 MMT grade to assist with overall mobility.  Baseline:  MMT Right eval Left eval R  8/25 L 8/25  Hip flexion 4+/5 2-/5 5 4-  Hip extension 2+/5 2+/5 3- 3-  Hip abduction 4/5 4+/5 4+ 5  Hip adduction 4/5 2+/5    Hip internal rotation      Hip external rotation      Knee flexion 4+/5  4/5 4+ 4+  Knee extension 4+/5 4/'5 5 5  ' Ankle dorsiflexion 5/5 5/5    Ankle plantarflexion      Ankle inversion      Ankle eversion       (Blank rows = not tested) Goal status: IN PROGRESS     PLAN: PT FREQUENCY: 1x/week  PT DURATION: 8 weeks  PLANNED INTERVENTIONS: Therapeutic exercises, Therapeutic activity, Neuromuscular re-education, Gait training, Patient/Family education, Stair training, Aquatic Therapy, Dry Needling, Cryotherapy, Moist heat, Taping, Vasopneumatic device, Ultrasound, Manual therapy, and Re-evaluation  PLAN FOR NEXT SESSION: review HEP, patella mobility bil LEs, bil LE strengthening, L hip flexibility therex, stretch R hip internal rotators.Progress CKC strength in water.    Ann Lions PT DPT PN2  11/12/2021, 2:43 PM      [facility] [address] [place], [place], [address] Phone: [phone]   Fax:  [phone]

## 2021-11-18 ENCOUNTER — Ambulatory Visit: Payer: No Typology Code available for payment source | Admitting: Obstetrics and Gynecology

## 2021-11-19 ENCOUNTER — Ambulatory Visit (HOSPITAL_BASED_OUTPATIENT_CLINIC_OR_DEPARTMENT_OTHER): Payer: PRIVATE HEALTH INSURANCE | Admitting: Physical Therapy

## 2021-11-24 ENCOUNTER — Encounter: Payer: Self-pay | Admitting: Cardiology

## 2021-11-26 ENCOUNTER — Encounter: Payer: Self-pay | Admitting: Obstetrics and Gynecology

## 2021-11-26 NOTE — Progress Notes (Deleted)
GYNECOLOGY  VISIT   HPI: 56 y.o.   Married White or Caucasian Not Hispanic or Latino  female   [phone] with No LMP recorded. Patient is postmenopausal.   here for consult to discuss HRTs.   GYNECOLOGIC HISTORY: No LMP recorded. Patient is postmenopausal. Contraception: PM Menopausal hormone therapy: Estrogen Patch/Oral progesterone?        OB History     Gravida  4   Para  1   Term  1   Preterm  0   AB  3   Living  1      SAB  3   IAB  0   Ectopic  0   Multiple      Live Births                 Patient Active Problem List   Diagnosis Date Noted   Gastroesophageal reflux disease 11/16/2020   Osteoarthritis of right knee 05/08/2019   Essential hypertension 05/25/2018   ADHD 01/06/2017   CTS (carpal tunnel syndrome) 01/06/2017   Asymptomatic microscopic hematuria 02/23/2016   Neck pain 12/28/2015   Anterior knee pain, right 05/21/2015   Degeneration of meniscus of right knee 05/21/2015   Effusion of knee 05/21/2015   Other meniscus derangements, unspecified meniscus, right knee 05/21/2015   Asthma 09/23/2013   Hyperlipidemia 12/16/2011   Impaired fasting glucose 12/16/2011   Obesity 12/16/2011   Fibroid    Allergic rhinitis 07/06/2009   Migraine 07/06/2009   Mitral valve disorder 07/06/2009   Palpitations 07/06/2009   Restless legs syndrome 07/06/2009   Urinary incontinence 07/06/2009   Vitamin D deficiency 07/06/2009    Past Medical History:  Diagnosis Date   Acute acalculous cholecystitis 12/04/2014   Acute cholecystitis 12/04/2014   Chondromalacia of knee    LEFT COMPARTMENT   Giardia    History of cervical dysplasia    History of gestational diabetes    History of uterine fibroid    Hypertension    Palpitations    Shortness of breath     Past Surgical History:  Procedure Laterality Date   CESAREAN SECTION  X2  11-16-2005   CHOLECYSTECTOMY N/A 12/04/2014   Procedure: LAPAROSCOPIC CHOLECYSTECTOMY;  Surgeon: Ralene Ok, MD;   Location: [facility];  Service: General;  Laterality: N/A;   COLPOSCOPY     GYNECOLOGIC CRYOSURGERY  age 40   KNEE ARTHROSCOPY Right 05/25/2015   Procedure: RIGHT KNEE ARTHROSCOPY;  Surgeon: Paralee Cancel, MD;  Location: [facility];  Service: Orthopedics;  Laterality: Right;   MYOMECTOMY ABDOMINAL APPROACH  12-29-2003   NEGATIVE SLEEP STUDY  2015    Current Outpatient Medications  Medication Sig Dispense Refill   ADDERALL XR 30 MG 24 hr capsule Take 1 capsule (30 mg total) by mouth daily. 30 capsule 0   Cholecalciferol (VITAMIN D PO) Take by mouth.     diphenhydrAMINE (BENADRYL) 25 MG tablet Take by mouth as needed.      estradiol (VIVELLE-DOT) 0.025 MG/24HR Place 1 patch onto the skin 2 (two) times a week. 24 patch 4   ibuprofen (ADVIL,MOTRIN) 200 MG tablet Take 200 mg by mouth every 6 (six) hours as needed for headache or mild pain. Reported on 06/12/2015     PROMETRIUM 100 MG capsule Take 1 capsule (100 mg total) by mouth daily. 90 capsule 4   Semaglutide-Weight Management (WEGOVY) 2.4 MG/0.75ML SOAJ Inject 2.4 mg into the skin every 7 (seven) days. 3 mL 3   tretinoin (RETIN-A) 0.05 % cream  Apply topically at bedtime. (Patient not taking: Reported on 10/01/2021) 45 g 11   verapamil (VERELAN PM) 180 MG 24 hr capsule Take 1 capsule (180 mg total) by mouth 2 (two) times a day. (Patient taking differently: Take 160 mg by mouth 2 (two) times a day.) 180 capsule 3   No current facility-administered medications for this visit.     ALLERGIES: Adhesive [tape], Bactrim [sulfamethoxazole-trimethoprim], Coconut flavor, Erythromycin, and Sulfa antibiotics  Family History  Problem Relation Age of Onset   Cleft lip Brother    Melanoma Father    Stroke Mother     Social History   Socioeconomic History   Marital status: Married    Spouse name: Not on file   Number of children: 1   Years of education: Not on file   Highest education level: Not on file  Occupational History   Not on  file  Tobacco Use   Smoking status: Never   Smokeless tobacco: Never  Vaping Use   Vaping Use: Never used  Substance and Sexual Activity   Alcohol use: Yes    Alcohol/week: 4.0 standard drinks of alcohol    Types: 4 Standard drinks or equivalent per week    Comment: 4 glasses of wine per week   Drug use: No   Sexual activity: Yes    Comment: 1st intercourse 56 yo-Fewer than 5 partners  Other Topics Concern   Not on file  Social History Narrative   ** Merged History Encounter **       Social Determinants of Health   Financial Resource Strain: Not on file  Food Insecurity: Not on file  Transportation Needs: Not on file  Physical Activity: Not on file  Stress: Not on file  Social Connections: Not on file  Intimate Partner Violence: Not on file    ROS  PHYSICAL EXAMINATION:    There were no vitals taken for this visit.    General appearance: alert, cooperative and appears stated age Neck: no adenopathy, supple, symmetrical, trachea midline and thyroid {CHL AMB PHY EX THYROID NORM DEFAULT:[phone]::"normal to inspection and palpation"} Breasts: {Exam; breast:13139::"normal appearance, no masses or tenderness"} Abdomen: soft, non-tender; non distended, no masses,  no organomegaly  Pelvic: External genitalia:  no lesions              Urethra:  normal appearing urethra with no masses, tenderness or lesions              Bartholins and Skenes: normal                 Vagina: normal appearing vagina with normal color and discharge, no lesions              Cervix: {CHL AMB PHY EX CERVIX NORM DEFAULT:[phone]::"no lesions"}              Bimanual Exam:  Uterus:  {CHL AMB PHY EX UTERUS NORM DEFAULT:[phone]::"normal size, contour, position, consistency, mobility, non-tender"}              Adnexa: {CHL AMB PHY EX ADNEXA NO MASS DEFAULT:[phone]::"no mass, fullness, tenderness"}              Rectovaginal: {yes no:[id number]}.  Confirms.              Anus:  normal sphincter tone, no  lesions  Chaperone was present for exam.  ASSESSMENT     PLAN    An After Visit Summary was printed and given to the patient.  *** minutes  face to face time of which over 50% was spent in counseling.

## 2021-11-29 ENCOUNTER — Telehealth (INDEPENDENT_AMBULATORY_CARE_PROVIDER_SITE_OTHER): Payer: Self-pay | Admitting: Neurology

## 2021-11-29 DIAGNOSIS — I159 Secondary hypertension, unspecified: Secondary | ICD-10-CM

## 2021-11-29 DIAGNOSIS — E1169 Type 2 diabetes mellitus with other specified complication: Secondary | ICD-10-CM

## 2021-11-29 DIAGNOSIS — F909 Attention-deficit hyperactivity disorder, unspecified type: Secondary | ICD-10-CM

## 2021-11-29 DIAGNOSIS — E1149 Type 2 diabetes mellitus with other diabetic neurological complication: Secondary | ICD-10-CM

## 2021-11-29 MED ORDER — MOUNJARO 5 MG/0.5ML ~~LOC~~ SOAJ: 5.0000 mg | SUBCUTANEOUS | 3 refills | Status: DC

## 2021-11-29 NOTE — Progress Notes (Addendum)
[facility]    Provider:  Dr Jaynee Eagles Referring Provider: Crist Infante, MD Primary Care Physician:  Crist Infante, MD  CC:  ADHD, [id number]  56 year old female with CTS, chronic neck pain, ADHD  - Continue Adderall daily - emg/ncs showed CTS  - Diabetes initial Hgba1c 8, wegovy has been great on hgba1c but having side effects. In the past he has used Trulicity, victoza, phentermine, glipizide, glyburide,metformin, Glimepiride, invokana, jardiance, januvia, Pioglitazone, victoza,, Ozempic and Saxenda with nausea and he has also tried metformin with diarrhea.,rybelsus, orlistat (Xenical, Alli), phentermine-topiramate (Qsymia), naltrexone-bupropion (Contrave), and semaglutide [facility]), gliflozins, actos, also has been involved in a formal weight loss program for > 1 year (weight watchers, bariatric clinic, healthy weight and wellness center, [facility] MD, [facility] healthy weight loss, noom with great compliance). Will prescribe mounjaro.   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 Joylene Draft for CTS and ADHD. Patient has a PMHx of long standing ADHD treated with Adderall. She was diagnosed with ADHD many years ago and has been on daily medication with good control. She is primarily inattentive type. Records reviewed previous physicians consistent with ADHD. She has pain in the hands, tingling with weakness in the left > right. She has neck pain which has been longstanding and has been to PT with medical management. No other focal neurologic deficits, associated symptoms, inciting events or modifiable factors.  Reviewed notes, labs and imaging from outside physicians, which showed:  Clinical Data:   Severe right arm pain extending to right fourth and fifth finger.  No known injury.  Woke up this way.   MRI CERVICAL SPINE WITHOUT CONTRAST: Comparison:   None. Cervicomedullary junction and visualized intracranial structures are grossly within normal limits.   Top normal size high jugulodigastric lymph nodes, with short axis diameter less than 1 cm.  The cervical spinal cord is of normal caliber without focal signal abnormality.  There is a flow void within both vertebral arteries.   C2-3:  No significant spinal stenosis or foraminal narrowing.  Minimal facet joint degenerative changes.   C3-4:  Minimal bulge.  Minimal facet joint degenerative changes.  Minimal left-sided uncovertebral joint degenerative changes and minimal left-sided foraminal narrowing.  C4-5:  Mild left-sided facet joint degenerative changes.  Minimal left-sided foraminal narrowing.  Minimal bulge, slightly greater towards the left.   C5-6:  Small to moderate sized broad-based central protrusion causes mild spinal stenosis.  Additionally, there is a small left posterior lateral cephalad extending extruded disk elevating the posterior longitudinal ligament causing slight impression upon the left ventral aspect of the thecal sac near the left C6 nerve root.  Minimal facet joint degenerative changes and minimal right-sided foraminal narrowing. C6-7:  Moderate to large sized broad-based right posterior lateral disk protrusion with impression upon the thecal sac and deformity of the right aspect of the cord as well as mass effect upon the right C7 nerve root.  Slight narrowing of the origin of the right C6-7 foramen.  Minimal bilateral facet joint degenerative changes. C7-T1:  Minimal bilateral facet joint degenerative changes.   IMPRESSION: 1.  The most significant finding on present examination is the moderate to large-sized right posterior lateral C6-7 disk protrusion and associated mass effect as described above.   2.  C5-6 disk protrusion has two components; broad-based central component with mild spinal stenosis with a small extruded cephalad extending component left posterior lateral position, elevating the posterior longitudinal ligament and causing slight  impression upon the left ventral  aspect of the thecal sac near the left C6 nerve root  CBC, CP unremarkable  Review of Systems: Patient complains of symptoms per HPI as well as the following symptoms:no CP, no SOB.  Pertinent negatives and positives per HPI. All others negative.   Social History   Socioeconomic History   Marital status: Married    Spouse name: Not on file   Number of children: 1   Years of education: Not on file   Highest education level: Not on file  Occupational History   Not on file  Tobacco Use   Smoking status: Never   Smokeless tobacco: Never  Vaping Use   Vaping Use: Never used  Substance and Sexual Activity   Alcohol use: Yes    Alcohol/week: 4.0 standard drinks of alcohol    Types: 4 Standard drinks or equivalent per week    Comment: 4 glasses of wine per week   Drug use: No   Sexual activity: Yes    Birth control/protection: Post-menopausal    Comment: 1st intercourse 56 yo-Fewer than 5 partners  Other Topics Concern   Not on file  Social History Narrative   ** Merged History Encounter **       Social Determinants of Health   Financial Resource Strain: Not on file  Food Insecurity: Not on file  Transportation Needs: Not on file  Physical Activity: Not on file  Stress: Not on file  Social Connections: Not on file  Intimate Partner Violence: Not on file    Family History  Problem Relation Age of Onset   Cleft lip Brother    Melanoma Father    Stroke Mother     Past Medical History:  Diagnosis Date   Acute acalculous cholecystitis 12/04/2014   Acute cholecystitis 12/04/2014   Chondromalacia of knee    LEFT COMPARTMENT   Giardia    History of cervical dysplasia    History of gestational diabetes    History of uterine fibroid    Hypertension    Palpitations    Shortness of breath     Past Surgical History:  Procedure Laterality Date   CESAREAN SECTION  X2  11-16-2005   CHOLECYSTECTOMY N/A 12/04/2014   Procedure: LAPAROSCOPIC CHOLECYSTECTOMY;  Surgeon:  Ralene Ok, MD;  Location: [place];  Service: General;  Laterality: N/A;   COLPOSCOPY     GYNECOLOGIC CRYOSURGERY  age 56   KNEE ARTHROSCOPY Right 05/25/2015   Procedure: RIGHT KNEE ARTHROSCOPY;  Surgeon: Paralee Cancel, MD;  Location: [place];  Service: Orthopedics;  Laterality: Right;   MYOMECTOMY ABDOMINAL APPROACH  12-29-2003   NEGATIVE SLEEP STUDY  2015    Current Outpatient Medications  Medication Sig Dispense Refill   tirzepatide (MOUNJARO) 5 MG/0.5ML Pen Inject 5 mg into the skin once a week. 2 mL 3   ADDERALL XR 30 MG 24 hr capsule Take 1 capsule (30 mg total) by mouth daily. 30 capsule 0   Cholecalciferol (VITAMIN D PO) Take by mouth.     diphenhydrAMINE (BENADRYL) 25 MG tablet Take by mouth as needed.      estradiol (VIVELLE-DOT) 0.025 MG/24HR Place 1 patch onto the skin 2 (two) times a week. 24 patch 4   ibuprofen (ADVIL,MOTRIN) 200 MG tablet Take 200 mg by mouth every 6 (six) hours as needed for headache or mild pain. Reported on 06/12/2015     PROMETRIUM 100 MG capsule Take 1 capsule (100 mg total) by mouth daily. [place]  capsule 4   tretinoin (RETIN-A) 0.05 % cream Apply topically at bedtime. (Patient not taking: Reported on 10/01/2021) 45 g 11   verapamil (VERELAN PM) 180 MG 24 hr capsule Take 1 capsule (180 mg total) by mouth 2 (two) times a day. (Patient taking differently: Take 160 mg by mouth 2 (two) times a day.) 180 capsule 3   No current facility-administered medications for this visit.    Allergies as of 11/29/2021 - Review Complete 11/12/2021  Allergen Reaction Noted   Adhesive [tape] Other (See Comments) 05/20/2015   Bactrim [sulfamethoxazole-trimethoprim] Other (See Comments) 12/03/2014   Coconut flavor Nausea And Vomiting 08/07/2011   Erythromycin Other (See Comments) 05/21/2015   Sulfa antibiotics Other (See Comments) 12/03/2014    Vitals: There were no vitals taken for this visit. Last Weight:  Wt Readings from Last 1 Encounters:  11/16/20  221 lb (100.2 kg)   Last Height:   Ht Readings from Last 1 Encounters:  11/16/20 5' 9.29" (1.76 m)        Physical exam: Exam: Gen: NAD, conversant, well nourised, obese, well groomed                     CV: RRR, no MRG. No Carotid Bruits. No peripheral edema, warm, nontender Eyes: Conjunctivae clear without exudates or hemorrhage  Neuro: Detailed Neurologic Exam  Speech:    Speech is normal; fluent and spontaneous with normal comprehension.  Cognition:    The patient is oriented to person, place, and time;     recent and remote memory intact;     language fluent;     normal attention, concentration,     fund of knowledge Cranial Nerves:    The pupils are equal, round, and reactive to light. The fundi are normal and spontaneous venous pulsations are present. Visual fields are full to finger confrontation. Extraocular movements are intact. Trigeminal sensation is intact and the muscles of mastication are normal. The face is symmetric. The palate elevates in the midline. Hearing intact. Voice is normal. Shoulder shrug is normal. The tongue has normal motion without fasciculations.   Coordination:    Normal finger to nose and heel to shin. Normal rapid alternating movements.   Gait:    Heel-toe and tandem gait are normal.   Motor Observation:    No asymmetry, no atrophy, and no involuntary movements noted. Tone:    Normal muscle tone.    Posture:    Posture is normal. normal erect    Strength:    Strength is V/V in the upper and lower limbs.      Sensation: intact to LT     Reflex Exam:  DTR's:    Deep tendon reflexes in the upper and lower extremities are normal bilaterally.   Toes:    The toes are downgoing bilaterally.   Clonus:    Clonus is absent.  Assessment/Plan:  56 year old female with CTS, chronic neck pain, ADHD  - Continue Adderall daily - emg/ncs fshowed CTS  -  Assessment/Plan:  56 year old female with CTS, chronic neck pain, ADHD  -  Continue Adderall daily - emg/ncs showed CTS  - Diabetes initial Hgba1c 8, wegovy has been great on hgba1c but having side effects. In the past he has used Trulicity, victoza, phentermine, glipizide, glyburide,metformin, Glimepiride, invokana, jardiance, januvia, Pioglitazone, victoza,, Ozempic and Saxenda with nausea and he has also tried metformin with diarrhea.,rybelsus, orlistat (Xenical, Alli), phentermine-topiramate (Qsymia), naltrexone-bupropion (Contrave), and semaglutide [facility]), gliflozins, actos, also  has been involved in a formal weight loss program for > 1 year (weight watchers, bariatric clinic, healthy weight and wellness center, [facility], [facility] healthy weight loss, noom with great compliance). Will prescribe mounjaro.   Meds ordered this encounter  Medications   tirzepatide (MOUNJARO) 5 MG/0.5ML Pen    Sig: Inject 5 mg into the skin once a week.    Dispense:  2 mL    Refill:  [place], MD  [facility] [address] [place], [place] [address]  Phone [phone] Fax [phone]

## 2021-12-03 ENCOUNTER — Ambulatory Visit (HOSPITAL_BASED_OUTPATIENT_CLINIC_OR_DEPARTMENT_OTHER): Payer: PRIVATE HEALTH INSURANCE | Attending: Student | Admitting: Physical Therapy

## 2021-12-03 ENCOUNTER — Ambulatory Visit (HOSPITAL_BASED_OUTPATIENT_CLINIC_OR_DEPARTMENT_OTHER): Payer: PRIVATE HEALTH INSURANCE | Admitting: Physical Therapy

## 2021-12-03 ENCOUNTER — Encounter (HOSPITAL_BASED_OUTPATIENT_CLINIC_OR_DEPARTMENT_OTHER): Payer: Self-pay | Admitting: Physical Therapy

## 2021-12-03 ENCOUNTER — Encounter: Payer: Self-pay | Admitting: Obstetrics and Gynecology

## 2021-12-03 ENCOUNTER — Ambulatory Visit (INDEPENDENT_AMBULATORY_CARE_PROVIDER_SITE_OTHER): Payer: PRIVATE HEALTH INSURANCE | Admitting: Obstetrics and Gynecology

## 2021-12-03 ENCOUNTER — Telehealth: Payer: Self-pay | Admitting: *Deleted

## 2021-12-03 VITALS — BP 124/80 | HR 74 | Ht 69.0 in | Wt 211.0 lb

## 2021-12-03 DIAGNOSIS — M6281 Muscle weakness (generalized): Secondary | ICD-10-CM | POA: Diagnosis present

## 2021-12-03 DIAGNOSIS — M25561 Pain in right knee: Secondary | ICD-10-CM | POA: Diagnosis present

## 2021-12-03 DIAGNOSIS — G8929 Other chronic pain: Secondary | ICD-10-CM | POA: Diagnosis present

## 2021-12-03 DIAGNOSIS — M25562 Pain in left knee: Secondary | ICD-10-CM | POA: Diagnosis present

## 2021-12-03 DIAGNOSIS — Z01419 Encounter for gynecological examination (general) (routine) without abnormal findings: Secondary | ICD-10-CM

## 2021-12-03 DIAGNOSIS — R2689 Other abnormalities of gait and mobility: Secondary | ICD-10-CM | POA: Insufficient documentation

## 2021-12-03 DIAGNOSIS — Z79890 Hormone replacement therapy (postmenopausal): Secondary | ICD-10-CM | POA: Diagnosis not present

## 2021-12-03 DIAGNOSIS — R6882 Decreased libido: Secondary | ICD-10-CM | POA: Diagnosis not present

## 2021-12-03 MED ORDER — ESTRADIOL 0.025 MG/24HR TD PTTW: 1.0000 | MEDICATED_PATCH | TRANSDERMAL | 4 refills | Status: DC

## 2021-12-03 MED ORDER — PROMETRIUM 100 MG PO CAPS: 100.0000 mg | ORAL_CAPSULE | Freq: Every day | ORAL | 4 refills | Status: DC

## 2021-12-03 MED ORDER — PROGESTERONE MICRONIZED 100 MG PO CAPS: 100.0000 mg | ORAL_CAPSULE | Freq: Every day | ORAL | 4 refills | Status: DC

## 2021-12-03 NOTE — Telephone Encounter (Signed)
She can be on the Generic, I just refilled what she was already on.

## 2021-12-03 NOTE — Telephone Encounter (Signed)
[place] pharmacy called asking if it is medically indicated for patient to have brand Prometrium 100 mg capsules? Patient was seen today for annual exam. If not, okay to switch to generic? Please advise

## 2021-12-03 NOTE — Therapy (Signed)
[facility] [address] 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 Date of Birth: 1965/05/12 Referring Provider:  Crist Infante, MD  Encounter Date: 12/03/2021   OUTPATIENT PHYSICAL THERAPY LOWER EXTREMITY TREATMENT   Patient Name: Martha Loeffler, MD MRN: [id number] DOB:[birth year], 56 y.o., female Today's Date: 12/03/2021   PT End of Session - 12/03/21 0817     Visit Number 5    Number of Visits 12    Date for PT Re-Evaluation 11/12/21    Authorization Type [employer]    Progress Note Due on Visit 10    PT Start Time 0815    PT Stop Time 0900    PT Time Calculation (min) 45 min    Activity Tolerance Patient tolerated treatment well    Behavior During Therapy [facility] for tasks assessed/performed                Past Medical History:  Diagnosis Date   Acute acalculous cholecystitis 12/04/2014   Acute cholecystitis 12/04/2014   Chondromalacia of knee    LEFT COMPARTMENT   Giardia    History of cervical dysplasia    History of gestational diabetes    History of uterine fibroid    Hypertension    Palpitations    Shortness of breath    Past Surgical History:  Procedure Laterality Date   CESAREAN SECTION  X2  11-16-2005   CHOLECYSTECTOMY N/A 12/04/2014   Procedure: LAPAROSCOPIC CHOLECYSTECTOMY;  Surgeon: Ralene Ok, MD;  Location: [place];  Service: General;  Laterality: N/A;   COLPOSCOPY     GYNECOLOGIC CRYOSURGERY  age 38   KNEE ARTHROSCOPY Right 05/25/2015   Procedure: RIGHT KNEE ARTHROSCOPY;  Surgeon: Paralee Cancel, MD;  Location: [place];  Service: Orthopedics;  Laterality: Right;   MYOMECTOMY ABDOMINAL APPROACH  12-29-2003   NEGATIVE SLEEP STUDY  2015   Patient Active Problem List   Diagnosis Date Noted   Gastroesophageal reflux disease 11/16/2020   Osteoarthritis of right knee 05/08/2019   Essential  hypertension 05/25/2018   ADHD 01/06/2017   CTS (carpal tunnel syndrome) 01/06/2017   Asymptomatic microscopic hematuria 02/23/2016   Neck pain 12/28/2015   Anterior knee pain, right 05/21/2015   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 Crist Infante, MD  REFERRING PROVIDER: Derl Barrow, PA  REFERRING DIAG: [phone] (ICD-10-CM) - Unilateral primary osteoarthritis, left knee  THERAPY DIAG:  Other abnormalities of gait and mobility  Chronic pain of left knee  Muscle weakness (generalized)  Chronic pain of right knee  Rationale for Evaluation and Treatment Rehabilitation  ONSET DATE: 2023  SUBJECTIVE:   SUBJECTIVE STATEMENT:   "TKR scheduled for the week after Thanksgiving.  Pain is low today"  PERTINENT HISTORY: [place] in 2014-2015 slipping in the gym shower falling into knee valgus. There was no grip attaching the mat to the floor. Swelling both  knees. Saw MD for acute injury . Meniscus damaged on both sides R >  L.She has recently been told she knees a total knee; she is ready for surgery but MD wants her to wait.  Has had sterioid injections, gel injections with last being in late June 2023.  Pain both knees upon locking in of knees after transfer  PAIN:  Are you having pain? Not currently but pain with walking and descending stairs with pain getting up to 6-8/10   PRECAUTIONS: None  WEIGHT BEARING RESTRICTIONS No  FALLS:  Has patient fallen in last 6 months? No  LIVING ENVIRONMENT: Lives with:  lives with their family Lives in: House/apartment Has following equipment at home: None  OCCUPATION: Physician  PLOF: Independent  PATIENT GOALS walk without pain  TODAY'S TREATMENT  9/15 Pt seen for aquatic therapy today.  Treatment took place in water 3.25-4.5 ft in depth at the [facility]. Temp of water was 91.  Pt entered/exited the pool via steps with hand rail.  Seated flutter (SLR);add/abd 3x20-25reps CKC TKE R&L 2x12 rising onto bottom step Adductor set using BB x5 hold x10s STS with add set 3rd from bottom step x 5; 4th step x10  Plank hip ext 2x10. Cues for abdominal bracing and glut contraction with hip extension Quad stretch kneeling on bottom step Runners stretch hamstring/gastroc 3rd step Side lunges with add/abd ue using yellow hand buoys x 4widths; forward and backward lunges x 2 widths ea SL squats 43f ue support yellow hand buoys 10 R&L, squats 2 x 10 Cycling on yellow noodle indep end of session  Pt requires the buoyancy and hydrostatic pressure of water for support, and to offload joints by unweighting joint load by at least 50 % in navel deep water and by at least 75-80% in chest to neck deep water.  Viscosity of the water is needed for resistance of strengthening. Water current perturbations provides challenge to standing balance requiring increased core activation.    8/25  Nustep L6x6 minutes BLEs only  Objective measures and recertification- education as appropriate   Hip hikes x10 green TB  U bridge x10 B  Walking bridges x10   8/4 Reviewed and updated HEP   Bridges 1x15 green TB 5 second holds at the top SL SLR 2x10 each side  SLR 2x10  Quad set 2x10   Standing heel raise x20  Standing slow march with grade UE support Hip extension 2x10     7/21  FOTO 40  Bridges 1x15 red TB 5 second holds at the top Sidelying clams 1x15 B red TB  Prone hip extensions 1x10 B red TB  Walking bridges 2x5 B  SLR with ER 1x10 B for  VMO activation   Patella mobility B knees all directions as tolerated (L much less immobile than R)  STS with red TB around knees 1x10   HS and piriformis stretches 2x30 seconds B each      OBJECTIVE:   DIAGNOSTIC FINDINGS: 03/03/20 Xray R knee: Degenerative changes similar to that seen on previous exam in 2020.  PATIENT SURVEYS:  Not performed at evaluation due to time constraint  COGNITION:  Overall cognitive status: Within functional limits for tasks assessed     SENSATION: WFL  EDEMA:  none   POSTURE:  L quad activation with standing, R hip internal rotation with gait, increased WB L with gait  PALPATION: R patella laterally tracks with quad set, R patella mobility limited all directions, L patella mobility limited but medial/lateral more mobile than superior/inferior. L ITB/quad tighter with TTP L ITB. No extension  lag with SLR  LOWER EXTREMITY ROM:  Passive ROM Right eval Left eval  Hip flexion [facility]   Hip extension [facility]  Hip abduction [facility]  Hip adduction [facility]  Hip internal rotation [facility] WFL  Hip external rotation [facility] WFL with discomfort reported and restriction palpated  Knee flexion [facility]  Knee extension [facility]  Ankle dorsiflexion [facility]  Ankle plantarflexion    Ankle inversion    Ankle eversion     (Blank rows = not tested) Knee AROM WNL; L hip flexion impaired and uncomfortable  LOWER EXTREMITY MMT:  MMT Right eval Left eval R 8/25 L 8/25  Hip flexion 4+/5 2-/5 5 4-  Hip extension 2+/5 2+/5 3- 3-  Hip abduction 4/5 4+/5 4+ 5  Hip adduction 4/5 2+/5    Hip internal rotation      Hip external rotation      Knee flexion 4+/5 4/5 4+ 4+  Knee extension 4+/5 4/'5 5 5  ' Ankle dorsiflexion 5/5 5/5    Ankle plantarflexion      Ankle inversion      Ankle eversion       (Blank rows = not tested)  LOWER EXTREMITY SPECIAL TESTS:  Hip special tests: Saralyn Pilar (FABER) test: positive , Thomas test: positive , and Ober's test: positive  all  positive on L. SLR + for Hamstring tightness bil LEs   GAIT: Distance walked: in gym Assistive device utilized: None Level of assistance: Complete Independence Comments: increased WB L with gait    TODAY'S TREATMENT: At eval: see pt education   PATIENT EDUCATION:  Education details: PT findings reviewed, PT POC reviewed, HEP issued Person educated: Patient Education method: Explanation, Demonstration, and Handouts Education comprehension: verbalized understanding and returned demonstration   HOME EXERCISE PROGRAM: Access Code: [email or web address] URL: [email or web address] Date: 10/01/2021 Prepared by: America Brown  Exercises - Seated Hamstring Stretch  - 1 x daily - 7 x weekly - 1 sets - 2 reps - 30 sec hold - Standing Hip Flexor Stretch  - 1 x daily - 7 x weekly - 1 sets - 2 reps - 30 sec hold - Supine ITB Stretch  - 1 x daily - 7 x weekly - 1 sets - 2 reps - 30 sec hold  ASSESSMENT:  CLINICAL IMPRESSION: Pt demonstrates safety and indep in setting. Instruction given from deck by therapist. Initial pain low 2/10. She tolerates all directed exercises without increase in pain, requiring vc as well as demonstration for proper execution. Some right knee discomfort with CKC TKE on bottom step but able to continue without further irritation. Focus on hip and knee complex musculature to improve strength and readiness for surgical intervention late November. Pt will benefit from aquatic rehab using the properties of water to facilitate and hasten progression towards goals.   OBJECTIVE IMPAIRMENTS Abnormal gait, decreased activity tolerance, decreased endurance, decreased mobility, difficulty walking, decreased strength, impaired flexibility, and pain.   ACTIVITY LIMITATIONS sitting, standing, squatting, stairs, transfers, and locomotion level  PARTICIPATION LIMITATIONS: community activity and occupation  [place] Past/current experiences are also affecting patient's  functional outcome.   REHAB POTENTIAL: Good  CLINICAL DECISION MAKING: Evolving/moderate complexity  EVALUATION COMPLEXITY: Moderate   GOALS: Goals reviewed with patient? Yes  SHORT TERM GOALS: Target date: 12/24/2021  Pt will be indep with initial HEP to assist with bil knee pain management  for mobility at work Baseline:initiated at eval Goal status: MET  2.  Pt will report 25% improvement  in L knee pain with walking Baseline: 8/25- 25% improvement L, R still painful  Goal status: MET    LONG TERM GOALS: Target date: 01/14/2022   Pt will be able to walk x 15 min </=3/10 bil knee pain for neighborhood walking Baseline: 8/25- endurance is better pain is still a problem  Goal status: IN PROGRESS  2.  Pt will be able to walk at work with 75% less difficulty Baseline: 8/25- but tends to cruise around on rolling chair Goal status: MET  3.  Pt will be able to descend stairs  with </= 3/10 Baseline:  Goal status: MET  4.  Pt will be able to walk without holding onto a cart to shop Baseline:  Goal status: MET  5.  Pt will demo improvement in bil LE strengthening >/=1 MMT grade to assist with overall mobility.  Baseline:  MMT Right eval Left eval R 8/25 L 8/25  Hip flexion 4+/5 2-/5 5 4-  Hip extension 2+/5 2+/5 3- 3-  Hip abduction 4/5 4+/5 4+ 5  Hip adduction 4/5 2+/5    Hip internal rotation      Hip external rotation      Knee flexion 4+/5 4/5 4+ 4+  Knee extension 4+/5 4/'5 5 5  ' Ankle dorsiflexion 5/5 5/5    Ankle plantarflexion      Ankle inversion      Ankle eversion       (Blank rows = not tested) Goal status: IN PROGRESS     PLAN: PT FREQUENCY: 1x/week  PT DURATION: 8 weeks  PLANNED INTERVENTIONS: Therapeutic exercises, Therapeutic activity, Neuromuscular re-education, Gait training, Patient/Family education, Stair training, Aquatic Therapy, Dry Needling, Cryotherapy, Moist heat, Taping, Vasopneumatic device, Ultrasound, Manual therapy, and  Re-evaluation  PLAN FOR NEXT SESSION: review HEP, patella mobility bil LEs, bil LE strengthening, L hip flexibility therex, stretch R hip internal rotators.Progress CKC strength in water.   Stanton Kidney Tharon Aquas) Savyon Loken MPT 12/03/2021, 11:26 AM    [facility] [address] [place], [place], [address] Phone: [phone]   Fax:  [phone]

## 2021-12-03 NOTE — Progress Notes (Signed)
56 y.o. [id number] Married White or Caucasian Not Hispanic or Latino female here for annual exam.  No vaginal bleeding. Discuss HRT, wants to change to the combipatch, aware the dose is higher.  She is interested in testosterone cream. She reports a decrease in libido and is having a harder time to have an orgasm. No dyspareunia.  She is having a knee replacement in 11/23.   She has lost over 30 lbs on weight loss medication. Originally on ozempic which caused severe constipation, now she is on mounjaro which she is tolerating fine.      No LMP recorded. Patient is postmenopausal.          Sexually active: Yes.    The current method of family planning is post menopausal status.    Exercising: Yes.    Gym/ health club routine includes walking on track . Smoker:  no  Health Maintenance: Pap:  11/16/2020 normal, negative hpv testing. h/o cryosurgery at 26-27  History of abnormal Pap:  yes MMG:  05/01/2020 BI-RADS CATEGORY  1: Negative. BMD:   recently with her primary.  Colonoscopy: at 10, small polyp, thinks she is due in December.  TDaP:  thinks UTD Gardasil: n/a   reports that she has never smoked. She has never used smokeless tobacco. She reports current alcohol use of about 4.0 standard drinks of alcohol per week. She reports that she does not use drugs. She is from [place], works as a Garment/textile technologist for [employer]. 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 Husband is a Clinical research associate. Professor at [employer].  Daughter is 29, 11th grade.   Past Medical History:  Diagnosis Date   Acute acalculous cholecystitis 12/04/2014   Acute cholecystitis 12/04/2014   Chondromalacia of knee    LEFT COMPARTMENT   Giardia    History of cervical dysplasia    History of gestational diabetes    History of uterine fibroid    Hypertension    Palpitations    Shortness of breath     Past Surgical History:  Procedure Laterality Date   CESAREAN SECTION  X2  11-16-2005   CHOLECYSTECTOMY N/A  12/04/2014   Procedure: LAPAROSCOPIC CHOLECYSTECTOMY;  Surgeon: Ralene Ok, MD;  Location: [facility];  Service: General;  Laterality: N/A;   COLPOSCOPY     GYNECOLOGIC CRYOSURGERY  age 46   KNEE ARTHROSCOPY Right 05/25/2015   Procedure: RIGHT KNEE ARTHROSCOPY;  Surgeon: Paralee Cancel, MD;  Location: [facility];  Service: Orthopedics;  Laterality: Right;   MYOMECTOMY ABDOMINAL APPROACH  12-29-2003   NEGATIVE SLEEP STUDY  2015    Current Outpatient Medications  Medication Sig Dispense Refill   ADDERALL XR 30 MG 24 hr capsule Take 1 capsule (30 mg total) by mouth daily. 30 capsule 0   Cholecalciferol (VITAMIN D PO) Take by mouth.     estradiol (VIVELLE-DOT) 0.025 MG/24HR Place 1 patch onto the skin 2 (two) times a week. 24 patch 4   ibuprofen (ADVIL,MOTRIN) 200 MG tablet Take 200 mg by mouth every 6 (six) hours as needed for headache or mild pain. Reported on 06/12/2015     PROMETRIUM 100 MG capsule Take 1 capsule (100 mg total) by mouth daily. 90 capsule 4   tirzepatide (MOUNJARO) 5 MG/0.5ML Pen Inject 5 mg into the skin once a week. 2 mL 3   verapamil (VERELAN PM) 180 MG 24 hr capsule Take 1 capsule (180 mg total) by mouth 2 (two) times a day. (Patient taking differently: Take 160 mg  by mouth 2 (two) times a day.) 180 capsule 3   No current facility-administered medications for this visit.    Family History  Problem Relation Age of Onset   Cleft lip Brother    Melanoma Father    Stroke Mother     Review of Systems  All other systems reviewed and are negative.   Exam:   BP 124/80 (BP Location: Right Arm, Patient Position: Sitting, Cuff Size: Normal)   Pulse 74   Ht '5\' 9"'$  (1.753 m)   Wt 211 lb (95.7 kg)   BMI 31.16 kg/m   Weight change: '@WEIGHTCHANGE'$ @ Height:   Height: '5\' 9"'$  (175.3 cm)  Ht Readings from Last 3 Encounters:  12/03/21 '5\' 9"'$  (1.753 m)  11/16/20 5' 9.29" (1.76 m)  08/23/19 '5\' 10"'$  (1.778 m)    General appearance: alert, cooperative and appears stated  age Head: Normocephalic, without obvious abnormality, atraumatic Neck: no adenopathy, supple, symmetrical, trachea midline and thyroid normal to inspection and palpation Lungs: clear to auscultation bilaterally Cardiovascular: regular rate and rhythm Breasts: normal appearance, no masses or tenderness Abdomen: soft, non-tender; non distended,  no masses,  no organomegaly Extremities: extremities normal, atraumatic, no cyanosis or edema Skin: Skin color, texture, turgor normal. No rashes or lesions Lymph nodes: Cervical, supraclavicular, and axillary nodes normal. No abnormal inguinal nodes palpated Neurologic: Grossly normal   Pelvic: External genitalia:  no lesions              Urethra:  normal appearing urethra with no masses, tenderness or lesions              Bartholins and Skenes: normal                 Vagina: normal appearing vagina with normal color and discharge, no lesions              Cervix:  small polyp, not friable               Bimanual Exam:  Uterus:   no masses or tenderness              Adnexa: no mass, fullness, tenderness               Rectovaginal: Confirms               Anus:  normal sphincter tone, no lesions  Wandra Scot, RMA chaperoned for the exam.  1. Well woman exam Discussed breast self exam Discussed calcium and vit D intake Mammogram due, she will schedule She will schedule her colonoscopy Labs with primary  2. Hormone replacement therapy (HRT) - estradiol (VIVELLE-DOT) 0.025 MG/24HR; Place 1 patch onto the skin 2 (two) times a week.  Dispense: 24 patch; Refill: 4 - PROMETRIUM 100 MG capsule; Take 1 capsule (100 mg total) by mouth daily.  Dispense: 90 capsule; Refill: 4 -After her knee replacement in 11/23, she will call to change to the combipatch. Aware of the risks of HRT, she will stop HRT while she is immobile secondary to the risk of blood clots  3. Low libido -Discussed, information given - Testos,Total,Free and SHBG (Female) -If her  levels are low will call in testosterone cream, she is aware of the risks

## 2021-12-03 NOTE — Patient Instructions (Signed)

## 2021-12-03 NOTE — Telephone Encounter (Signed)
I called [facility] and was told patient has used generic in past. Generic sent.

## 2021-12-08 LAB — TESTOS,TOTAL,FREE AND SHBG (FEMALE)
Free Testosterone: 3 pg/mL (ref 0.1–6.4)
Sex Hormone Binding: 63 nmol/L (ref 14–73)
Testosterone, Total, LC-MS-MS: 29 ng/dL (ref 2–45)

## 2021-12-09 ENCOUNTER — Other Ambulatory Visit: Payer: Self-pay | Admitting: Neurology

## 2021-12-09 ENCOUNTER — Other Ambulatory Visit: Payer: Self-pay

## 2021-12-09 DIAGNOSIS — F909 Attention-deficit hyperactivity disorder, unspecified type: Secondary | ICD-10-CM

## 2021-12-09 DIAGNOSIS — R6882 Decreased libido: Secondary | ICD-10-CM

## 2021-12-09 MED ORDER — NONFORMULARY OR COMPOUNDED ITEM: 0 refills | Status: DC

## 2021-12-09 NOTE — Telephone Encounter (Signed)
Per [place] registry, last filled 10/27/21

## 2021-12-10 ENCOUNTER — Encounter (HOSPITAL_BASED_OUTPATIENT_CLINIC_OR_DEPARTMENT_OTHER): Payer: Self-pay | Admitting: Physical Therapy

## 2021-12-10 ENCOUNTER — Ambulatory Visit: Payer: Self-pay | Admitting: Obstetrics and Gynecology

## 2021-12-10 ENCOUNTER — Ambulatory Visit (HOSPITAL_BASED_OUTPATIENT_CLINIC_OR_DEPARTMENT_OTHER): Payer: PRIVATE HEALTH INSURANCE | Admitting: Physical Therapy

## 2021-12-10 DIAGNOSIS — R2689 Other abnormalities of gait and mobility: Secondary | ICD-10-CM

## 2021-12-10 DIAGNOSIS — M6281 Muscle weakness (generalized): Secondary | ICD-10-CM

## 2021-12-10 DIAGNOSIS — G8929 Other chronic pain: Secondary | ICD-10-CM

## 2021-12-10 NOTE — Therapy (Signed)
[facility] [address] [place], [place], [address] Phone: [phone]   Fax:  [phone]  Patient Details  Name: Martha Rennie, MD MRN: [id number] Date of Birth: [birth year] Referring Provider:  Crist Infante, MD  Encounter Date: 12/10/2021   OUTPATIENT PHYSICAL THERAPY LOWER EXTREMITY TREATMENT   Patient Name: Martha Fasig, MD MRN: [id number] DOB:1965-05-07, 56 y.o., female Today's Date: 12/10/2021   PT End of Session - 12/10/21 0943     Visit Number 6    Number of Visits 12    Date for PT Re-Evaluation 11/12/21    Authorization Type [employer]    Progress Note Due on Visit 10    PT Start Time 0945    PT Stop Time 1030    PT Time Calculation (min) 45 min    Activity Tolerance Patient tolerated treatment well    Behavior During Therapy [facility] for tasks assessed/performed                Past Medical History:  Diagnosis Date   Acute acalculous cholecystitis 12/04/2014   Acute cholecystitis 12/04/2014   Chondromalacia of knee    LEFT COMPARTMENT   Giardia    History of cervical dysplasia    History of gestational diabetes    History of uterine fibroid    Hypertension    Palpitations    Shortness of breath    Past Surgical History:  Procedure Laterality Date   CESAREAN SECTION  X2  11-16-2005   CHOLECYSTECTOMY N/A 12/04/2014   Procedure: LAPAROSCOPIC CHOLECYSTECTOMY;  Surgeon: Ralene Ok, MD;  Location: [facility];  Service: General;  Laterality: N/A;   COLPOSCOPY     GYNECOLOGIC CRYOSURGERY  age 37   KNEE ARTHROSCOPY Right 05/25/2015   Procedure: RIGHT KNEE ARTHROSCOPY;  Surgeon: Paralee Cancel, MD;  Location: [facility];  Service: Orthopedics;  Laterality: Right;   MYOMECTOMY ABDOMINAL APPROACH  12-29-2003   NEGATIVE SLEEP STUDY  2015   Patient Active Problem List   Diagnosis Date Noted   Gastroesophageal reflux disease 11/16/2020   Osteoarthritis of right knee 05/08/2019   Essential  hypertension 05/25/2018   ADHD 01/06/2017   CTS (carpal tunnel syndrome) 01/06/2017   Asymptomatic microscopic hematuria 02/23/2016   Neck pain 12/28/2015   Anterior knee pain, right 05/21/2015   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 Crist Infante, MD  REFERRING PROVIDER: Derl Barrow, PA  REFERRING DIAG: [phone] (ICD-10-CM) - Unilateral primary osteoarthritis, left knee  THERAPY DIAG:  Other abnormalities of gait and mobility  Chronic pain of left knee  Muscle weakness (generalized)  Chronic pain of right knee  Rationale for Evaluation and Treatment Rehabilitation  ONSET DATE: 2023  SUBJECTIVE:   SUBJECTIVE STATEMENT:   "No pain after last visit, felt good.  My knee really hasn't been bothering me much over the last few weeks."  PERTINENT HISTORY: [place] in 2014-2015 slipping in the gym shower falling into knee valgus. There was no grip attaching the mat to the floor. Swelling both  knees. Saw MD for  acute injury . Meniscus damaged on both sides R > L.She has recently been told she knees a total knee; she is ready for surgery but MD wants her to wait.  Has had sterioid injections, gel injections with last being in late June 2023.  Pain both knees upon locking in of knees after transfer  PAIN:  Are you having pain? Not currently but pain with walking and descending stairs with pain getting up to 6-8/10   PRECAUTIONS: None  WEIGHT BEARING RESTRICTIONS No  FALLS:  Has patient fallen in last 6 months?  No  LIVING ENVIRONMENT: Lives with: lives with their family Lives in: House/apartment Has following equipment at home: None  OCCUPATION: Physician  PLOF: Independent  PATIENT GOALS walk without pain  TODAY'S TREATMENT  9/22 Pt seen for aquatic therapy today.  Treatment took place in water 3.25-4.5 ft in depth at the [facility]. Temp of water was 91.  Pt entered/exited the pool via steps with hand rail.  Seated flutter (SLR);add/abd 3x20-25reps Seated Add/abd 3 x20 CKC TKE R&L 2x12 rising onto bottom step Adductor set using BB x5 hold x10s STS with add set 4th step x10  Plank hip ext 2x10. Cues for abdominal bracing and glut contraction with hip extension Quad stretch kneeling on bottom step Runners stretch hamstring/gastroc 3rd step Side lunges with add/abd ue using yellow hand buoys x 4widths; forward and backward lunges x 2 widths ea Cursy squats 19f ue support yellow hand buoys  Squats bottom step x 10 cues for weight through heels Cycling on yellow noodle indep end of session Step ups onto 2nd step x 7-8 reps R/L.  Pt reports some pressure medial aspect of right knee Pt swimming for recovery between exercises  Pt requires the buoyancy and hydrostatic pressure of water for support, and to offload joints by unweighting joint load by at least 50 % in navel deep water and by at least 75-80% in chest to neck deep water.  Viscosity of the water is needed for resistance of strengthening. Water current perturbations provides challenge to standing balance requiring increased core activation.    8/25  Nustep L6x6 minutes BLEs only  Objective measures and recertification- education as appropriate   Hip hikes x10 green TB  U bridge x10 B  Walking bridges x10   8/4 Reviewed and updated HEP   Bridges 1x15 green TB 5 second holds at the top SL SLR 2x10 each side  SLR 2x10  Quad set 2x10   Standing heel raise x20  Standing slow march with grade UE  support Hip extension 2x10     7/21  FOTO 40  Bridges 1x15 red TB 5 second holds at the top Sidelying clams 1x15 B red TB  Prone hip extensions 1x10 B red TB  Walking bridges 2x5 B  SLR with ER 1x10 B for VMO activation   Patella mobility B knees all directions as tolerated (L much less immobile than R)  STS with red TB around knees 1x10   HS and piriformis stretches 2x30 seconds B each      OBJECTIVE:   DIAGNOSTIC FINDINGS: 03/03/20 Xray R knee: Degenerative changes similar to that seen on previous exam in 2020.  PATIENT SURVEYS:  Not performed at evaluation due to time constraint  COGNITION:  Overall cognitive status: Within functional limits for tasks assessed     SENSATION: WFL  EDEMA:  none   POSTURE:  L quad activation with standing, R hip internal rotation with gait, increased  WB L with gait  PALPATION: R patella laterally tracks with quad set, R patella mobility limited all directions, L patella mobility limited but medial/lateral more mobile than superior/inferior. L ITB/quad tighter with TTP L ITB. No extension lag with SLR  LOWER EXTREMITY ROM:  Passive ROM Right eval Left eval  Hip flexion [facility]   Hip extension [facility]  Hip abduction [facility]  Hip adduction [facility]  Hip internal rotation [facility] WFL  Hip external rotation [facility] WFL with discomfort reported and restriction palpated  Knee flexion [facility]  Knee extension [facility]  Ankle dorsiflexion [facility]  Ankle plantarflexion    Ankle inversion    Ankle eversion     (Blank rows = not tested) Knee AROM WNL; L hip flexion impaired and uncomfortable  LOWER EXTREMITY MMT:  MMT Right eval Left eval R 8/25 L 8/25  Hip flexion 4+/5 2-/5 5 4-  Hip extension 2+/5 2+/5 3- 3-  Hip abduction 4/5 4+/5 4+ 5  Hip adduction 4/5 2+/5    Hip internal rotation      Hip external rotation      Knee flexion 4+/5 4/5 4+ 4+  Knee extension 4+/5 4/_0 Ankle dorsiflexion 5/5 5/5    Ankle plantarflexion       Ankle inversion      Ankle eversion       (Blank rows = not tested)  LOWER EXTREMITY SPECIAL TESTS:  Hip special tests: Saralyn Pilar (FABER) test: positive , Thomas test: positive , and Ober's test: positive  all positive on L. SLR + for Hamstring tightness bil LEs   GAIT: Distance walked: in gym Assistive device utilized: None Level of assistance: Complete Independence Comments: increased WB L with gait        PATIENT EDUCATION:  Education details: PT findings reviewed, PT POC reviewed, HEP issued Person educated: Patient Education method: Explanation, Demonstration, and Handouts Education comprehension: verbalized understanding and returned demonstration   HOME EXERCISE PROGRAM: Access Code: [email or web address] URL: [email or web address] Date: 10/01/2021 Prepared by: America Brown  Exercises - Seated Hamstring Stretch  - 1 x daily - 7 x weekly - 1 sets - 2 reps - 30 sec hold - Standing Hip Flexor Stretch  - 1 x daily - 7 x weekly - 1 sets - 2 reps - 30 sec hold - Supine ITB Stretch  - 1 x daily - 7 x weekly - 1 sets - 2 reps - 30 sec hold  ASSESSMENT:  CLINICAL IMPRESSION: Good response to last visit.  No residual pain. She reports compliance with HEP as instructed. Cues given to slow pacing of activities as she moves rather swiftly throughout with good motivation and time on task. Reports minimal medial right knee pressure with step ups that decreases with reps.  Advanced all exercises for increased strengthening which is tolerated very well. Goals ongoing     OBJECTIVE IMPAIRMENTS Abnormal gait, decreased activity tolerance, decreased endurance, decreased mobility, difficulty walking, decreased strength, impaired flexibility, and pain.   ACTIVITY LIMITATIONS sitting, standing, squatting, stairs, transfers, and locomotion level  PARTICIPATION LIMITATIONS: community activity and occupation  [place] Past/current experiences are also affecting patient's  functional outcome.   REHAB POTENTIAL: Good  CLINICAL DECISION MAKING: Evolving/moderate complexity  EVALUATION COMPLEXITY: Moderate   GOALS: Goals reviewed with patient? Yes  SHORT TERM GOALS: Target date: 12/31/2021  Pt will be indep with initial HEP to assist with bil knee pain management  for mobility at work Baseline:initiated at eval  Goal status: MET  2.  Pt will report 25% improvement in L knee pain with walking Baseline: 8/25- 25% improvement L, R still painful  Goal status: MET    LONG TERM GOALS: Target date: 01/21/2022   Pt will be able to walk x 15 min </=3/10 bil knee pain for neighborhood walking Baseline: 8/25- endurance is better pain is still a problem  Goal status: IN PROGRESS  2.  Pt will be able to walk at work with 75% less difficulty Baseline: 8/25- but tends to cruise around on rolling chair Goal status: MET  3.  Pt will be able to descend stairs  with </= 3/10 Baseline:  Goal status: MET  4.  Pt will be able to walk without holding onto a cart to shop Baseline:  Goal status: MET  5.  Pt will demo improvement in bil LE strengthening >/=1 MMT grade to assist with overall mobility.  Baseline:  MMT Right eval Left eval R 8/25 L 8/25  Hip flexion 4+/5 2-/5 5 4-  Hip extension 2+/5 2+/5 3- 3-  Hip abduction 4/5 4+/5 4+ 5  Hip adduction 4/5 2+/5    Hip internal rotation      Hip external rotation      Knee flexion 4+/5 4/5 4+ 4+  Knee extension 4+/5 4/_0 Ankle dorsiflexion 5/5 5/5    Ankle plantarflexion      Ankle inversion      Ankle eversion       (Blank rows = not tested) Goal status: IN PROGRESS     PLAN: PT FREQUENCY: 1x/week  PT DURATION: 8 weeks  PLANNED INTERVENTIONS: Therapeutic exercises, Therapeutic activity, Neuromuscular re-education, Gait training, Patient/Family education, Stair training, Aquatic Therapy, Dry Needling, Cryotherapy, Moist heat, Taping, Vasopneumatic device, Ultrasound, Manual therapy, and  Re-evaluation  PLAN FOR NEXT SESSION: review HEP, patella mobility bil LEs, bil LE strengthening, L hip flexibility therex, stretch R hip internal rotators.Progress CKC strength in water.   [address] [place]) Kebin Maye MPT 12/10/2021, 11:37 AM    [facility] [address] [place], [place], [address] Phone: [phone]   Fax:  [phone]

## 2021-12-12 MED ORDER — ADDERALL XR 30 MG PO CP24: 30.0000 mg | ORAL_CAPSULE | Freq: Every day | ORAL | 0 refills | Status: DC

## 2021-12-17 ENCOUNTER — Encounter (HOSPITAL_BASED_OUTPATIENT_CLINIC_OR_DEPARTMENT_OTHER): Payer: Self-pay | Admitting: Physical Therapy

## 2021-12-17 ENCOUNTER — Ambulatory Visit (HOSPITAL_BASED_OUTPATIENT_CLINIC_OR_DEPARTMENT_OTHER): Payer: PRIVATE HEALTH INSURANCE | Admitting: Physical Therapy

## 2021-12-17 DIAGNOSIS — R2689 Other abnormalities of gait and mobility: Secondary | ICD-10-CM | POA: Diagnosis not present

## 2021-12-17 DIAGNOSIS — G8929 Other chronic pain: Secondary | ICD-10-CM

## 2021-12-17 DIAGNOSIS — M6281 Muscle weakness (generalized): Secondary | ICD-10-CM

## 2021-12-17 NOTE — Therapy (Signed)
[facility] [address] [place], [place], [address] Phone: [phone]   Fax:  [phone]  Patient Details  Name: Dayshia Ballinas, MD MRN: [id number] Date of Birth: Feb 12, 1966 Referring Provider:  Crist Infante, MD  Encounter Date: 12/17/2021   OUTPATIENT PHYSICAL THERAPY LOWER EXTREMITY TREATMENT   Patient Name: Betzabe Bevans, MD MRN: [id number] DOB:Jun 01, 1965, 56 y.o., female Today's Date: 12/17/2021   PT End of Session - 12/17/21 1139     Visit Number 7    Number of Visits 12    Date for PT Re-Evaluation 11/12/21    Authorization Type [employer]    Progress Note Due on Visit 10    PT Start Time 0952    PT Stop Time 1030    PT Time Calculation (min) 38 min    Activity Tolerance Patient tolerated treatment well    Behavior During Therapy [facility] for tasks assessed/performed                 Past Medical History:  Diagnosis Date   Acute acalculous cholecystitis 12/04/2014   Acute cholecystitis 12/04/2014   Chondromalacia of knee    LEFT COMPARTMENT   Giardia    History of cervical dysplasia    History of gestational diabetes    History of uterine fibroid    Hypertension    Palpitations    Shortness of breath    Past Surgical History:  Procedure Laterality Date   CESAREAN SECTION  X2  11-16-2005   CHOLECYSTECTOMY N/A 12/04/2014   Procedure: LAPAROSCOPIC CHOLECYSTECTOMY;  Surgeon: Ralene Ok, MD;  Location: [facility];  Service: General;  Laterality: N/A;   COLPOSCOPY     GYNECOLOGIC CRYOSURGERY  age 24   KNEE ARTHROSCOPY Right 05/25/2015   Procedure: RIGHT KNEE ARTHROSCOPY;  Surgeon: Paralee Cancel, MD;  Location: [facility];  Service: Orthopedics;  Laterality: Right;   MYOMECTOMY ABDOMINAL APPROACH  12-29-2003   NEGATIVE SLEEP STUDY  2015   Patient Active Problem List   Diagnosis Date Noted   Gastroesophageal reflux disease 11/16/2020   Osteoarthritis of right knee 05/08/2019   Essential  hypertension 05/25/2018   ADHD 01/06/2017   CTS (carpal tunnel syndrome) 01/06/2017   Asymptomatic microscopic hematuria 02/23/2016   Neck pain 12/28/2015   Anterior knee pain, right 05/21/2015   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 Crist Infante, MD  REFERRING PROVIDER: Derl Barrow, PA  REFERRING DIAG: [phone] (ICD-10-CM) - Unilateral primary osteoarthritis, left knee  THERAPY DIAG:  Other abnormalities of gait and mobility  Chronic pain of left knee  Muscle weakness (generalized)  Chronic pain of right knee  Rationale for Evaluation and Treatment Rehabilitation  ONSET DATE: 2023  SUBJECTIVE:   SUBJECTIVE STATEMENT:   "I have less limitation now than I did 2 months ago."  PERTINENT HISTORY: [place] in 2014-2015 slipping in the gym shower falling into knee valgus. There was no grip attaching the mat to the floor. Swelling both  knees. Saw MD for acute injury . Meniscus damaged on both sides R >  L.She has recently been told she knees a total knee; she is ready for surgery but MD wants her to wait.  Has had sterioid injections, gel injections with last being in late June 2023.  Pain both knees upon locking in of knees after transfer  PAIN:  Are you having pain? Not currently but pain with walking and descending stairs with pain getting up to 3/10   PRECAUTIONS: None  WEIGHT BEARING RESTRICTIONS No  FALLS:  Has patient fallen in last 6 months? No  LIVING ENVIRONMENT: Lives with: lives with their  family Lives in: House/apartment Has following equipment at home: None  OCCUPATION: Physician  PLOF: Independent  PATIENT GOALS walk without pain  TODAY'S TREATMENT  9/22 Pt seen for aquatic therapy today.  Treatment took place in water 3.25-4.5 ft in depth at the [facility]. Temp of water was 91.  Pt entered/exited the pool via steps with hand rail.  Seated flutter (SLR);add/abd 3x20-25reps Seated Add/abd 3 x20 CKC TKE R&L 2x12 rising onto bottom step Adductor set using BB x5 hold x10s STS with add set 4th step x10  Plank hip ext 2x10. Cues for abdominal bracing and glut contraction with hip extension Quad stretch kneeling on bottom step Runners stretch hamstring/gastroc bench Side lunges with add/abd ue using yellow hand buoys x 4widths; forward and backward lunges x 2 widths ea Cursy squats 8f ue support yellow hand buoys  Squats bottom step x 10 cues for weight through heels Retro Step ups onto bottom steps then forward onto 2nd step x 7-8 reps R/L.  Pt reports some pressure medial aspect of right knee Pt swimming for recovery between exercises Cycling on yellow noodle indep end of session  Pt requires the buoyancy and hydrostatic pressure of water for support, and to offload joints by unweighting joint load by at least 50 % in navel deep water and by at least 75-80% in chest to neck deep water.  Viscosity of the water is needed for resistance of strengthening. Water current perturbations provides challenge to standing balance requiring increased core activation.    8/25  Nustep L6x6 minutes BLEs only  Objective measures and recertification- education as appropriate   Hip hikes x10 green TB  U bridge x10 B  Walking bridges x10   8/4 Reviewed and updated HEP   Bridges 1x15 green TB 5 second holds at the top SL SLR 2x10 each side  SLR 2x10  Quad set 2x10   Standing heel raise x20  Standing slow march with grade UE support Hip extension 2x10      7/21  FOTO 40  Bridges 1x15 red TB 5 second holds at the top Sidelying clams 1x15 B red TB  Prone hip extensions 1x10 B red TB  Walking bridges 2x5 B  SLR with ER 1x10 B for VMO activation   Patella mobility B knees all directions as tolerated (L much less immobile than R)  STS with red TB around knees 1x10   HS and piriformis stretches 2x30 seconds B each      OBJECTIVE:   DIAGNOSTIC FINDINGS: 03/03/20 Xray R knee: Degenerative changes similar to that seen on previous exam in 2020.  PATIENT SURVEYS:  Not performed at evaluation due to time constraint  COGNITION:  Overall cognitive status: Within functional limits for tasks assessed     SENSATION: WFL  EDEMA:  none   POSTURE:  L quad activation with standing, R hip internal rotation with gait, increased WB L with gait  PALPATION: R patella laterally tracks with quad set, R patella mobility limited all directions, L patella mobility limited but medial/lateral more mobile than superior/inferior. L ITB/quad tighter with TTP L ITB. No extension lag with SLR  LOWER EXTREMITY ROM:  Passive ROM Right eval Left eval  Hip flexion [facility]   Hip extension [facility]  Hip abduction [facility]  Hip adduction [facility]  Hip internal rotation [facility] WFL  Hip external rotation [facility] WFL with discomfort reported and restriction palpated  Knee flexion [facility]  Knee extension [facility]  Ankle dorsiflexion [facility]  Ankle plantarflexion    Ankle inversion    Ankle eversion     (Blank rows = not tested) Knee AROM WNL; L hip flexion impaired and uncomfortable  LOWER EXTREMITY MMT:  MMT Right eval Left eval R 8/25 L 8/25  Hip flexion 4+/5 2-/5 5 4-  Hip extension 2+/5 2+/5 3- 3-  Hip abduction 4/5 4+/5 4+ 5  Hip adduction 4/5 2+/5    Hip internal rotation      Hip external rotation      Knee flexion 4+/5 4/5 4+ 4+  Knee extension 4+/5 4/_0 Ankle dorsiflexion 5/5 5/5    Ankle plantarflexion      Ankle inversion       Ankle eversion       (Blank rows = not tested)  LOWER EXTREMITY SPECIAL TESTS:  Hip special tests: Saralyn Pilar (FABER) test: positive , Thomas test: positive , and Ober's test: positive  all positive on L. SLR + for Hamstring tightness bil LEs   GAIT: Distance walked: in gym Assistive device utilized: None Level of assistance: Complete Independence Comments: increased WB L with gait        PATIENT EDUCATION:  Education details: PT findings reviewed, PT POC reviewed, HEP issued Person educated: Patient Education method: Explanation, Demonstration, and Handouts Education comprehension: verbalized understanding and returned demonstration   HOME EXERCISE PROGRAM: Access Code: [email or web address] URL: [email or web address] Date: 10/01/2021 Prepared by: America Brown  Exercises - Seated Hamstring Stretch  - 1 x daily - 7 x weekly - 1 sets - 2 reps - 30 sec hold - Standing Hip Flexor Stretch  - 1 x daily - 7 x weekly - 1 sets - 2 reps - 30 sec hold - Supine ITB Stretch  - 1 x daily - 7 x weekly - 1 sets - 2 reps - 30 sec hold  ASSESSMENT:  CLINICAL IMPRESSION: Good response to last visit.  No residual pain. She has increased tolerance to activity as she hiked 1 mile yesterday.  Reports more stiffness vs pain (3/10). She consistently uses stationary bike at home which she reports has improved her exercises tolerance as well as the aqautic intervention increasing strength and decreasing pain. She executes exercises well but continues to need cuing for mindful movements/slowing speed of activity. No pain reported throughout session other than a little right medial knee pressure with STS. Goals ongoing.    OBJECTIVE IMPAIRMENTS Abnormal gait, decreased activity tolerance, decreased endurance, decreased mobility, difficulty walking, decreased strength, impaired flexibility, and pain.   ACTIVITY LIMITATIONS sitting, standing, squatting, stairs, transfers, and locomotion  level  PARTICIPATION LIMITATIONS: community activity and occupation  [place] Past/current experiences are also affecting patient's functional outcome.   REHAB POTENTIAL: Good  CLINICAL DECISION MAKING: Evolving/moderate complexity  EVALUATION COMPLEXITY: Moderate   GOALS: Goals reviewed with patient? Yes  SHORT TERM GOALS: Target date: 01/07/2022  Pt will be indep with  initial HEP to assist with bil knee pain management  for mobility at work Baseline:initiated at eval Goal status: MET  2.  Pt will report 25% improvement in L knee pain with walking Baseline: 8/25- 25% improvement L, R still painful  Goal status: MET    LONG TERM GOALS: Target date: 01/28/2022   Pt will be able to walk x 15 min </=3/10 bil knee pain for neighborhood walking Baseline: 8/25- endurance is better pain is still a problem  Goal status: IN PROGRESS  2.  Pt will be able to walk at work with 75% less difficulty Baseline: 8/25- but tends to cruise around on rolling chair Goal status: MET  3.  Pt will be able to descend stairs  with </= 3/10 Baseline:  Goal status: MET  4.  Pt will be able to walk without holding onto a cart to shop Baseline:  Goal status: MET  5.  Pt will demo improvement in bil LE strengthening >/=1 MMT grade to assist with overall mobility.  Baseline:  MMT Right eval Left eval R 8/25 L 8/25  Hip flexion 4+/5 2-/5 5 4-  Hip extension 2+/5 2+/5 3- 3-  Hip abduction 4/5 4+/5 4+ 5  Hip adduction 4/5 2+/5    Hip internal rotation      Hip external rotation      Knee flexion 4+/5 4/5 4+ 4+  Knee extension 4+/5 4/_0 Ankle dorsiflexion 5/5 5/5    Ankle plantarflexion      Ankle inversion      Ankle eversion       (Blank rows = not tested) Goal status: IN PROGRESS     PLAN: PT FREQUENCY: 1x/week  PT DURATION: 8 weeks  PLANNED INTERVENTIONS: Therapeutic exercises, Therapeutic activity, Neuromuscular re-education, Gait training, Patient/Family  education, Stair training, Aquatic Therapy, Dry Needling, Cryotherapy, Moist heat, Taping, Vasopneumatic device, Ultrasound, Manual therapy, and Re-evaluation  PLAN FOR NEXT SESSION: review HEP, patella mobility bil LEs, bil LE strengthening, L hip flexibility therex, stretch R hip internal rotators.Progress CKC strength in water.   [address] [place]) Arnel Wymer MPT 12/17/2021, 11:40 AM    [facility] [address] [place], [place], [address] Phone: [phone]   Fax:  [phone]

## 2021-12-24 ENCOUNTER — Ambulatory Visit (HOSPITAL_BASED_OUTPATIENT_CLINIC_OR_DEPARTMENT_OTHER): Payer: PRIVATE HEALTH INSURANCE | Attending: Student | Admitting: Physical Therapy

## 2021-12-24 ENCOUNTER — Encounter (HOSPITAL_BASED_OUTPATIENT_CLINIC_OR_DEPARTMENT_OTHER): Payer: Self-pay | Admitting: Physical Therapy

## 2021-12-24 DIAGNOSIS — M25561 Pain in right knee: Secondary | ICD-10-CM | POA: Insufficient documentation

## 2021-12-24 DIAGNOSIS — M6281 Muscle weakness (generalized): Secondary | ICD-10-CM | POA: Insufficient documentation

## 2021-12-24 DIAGNOSIS — M25562 Pain in left knee: Secondary | ICD-10-CM | POA: Insufficient documentation

## 2021-12-24 DIAGNOSIS — G8929 Other chronic pain: Secondary | ICD-10-CM | POA: Insufficient documentation

## 2021-12-24 DIAGNOSIS — R2689 Other abnormalities of gait and mobility: Secondary | ICD-10-CM | POA: Diagnosis present

## 2021-12-24 NOTE — Therapy (Signed)
[facility] [address] [place], [place], [address] Phone: [phone]   Fax:  [phone]  Patient Details  Name: Martha Brzozowski, MD MRN: [id number] Date of Birth: 1965-10-16 Referring Provider:  Crist Infante, MD  Encounter Date: 12/24/2021   OUTPATIENT PHYSICAL THERAPY LOWER EXTREMITY TREATMENT   Patient Name: Martha Garrette, MD MRN: [id number] DOB:17-Oct-1965, 56 y.o., female Today's Date: 12/24/2021   PT End of Session - 12/24/21 1001     Visit Number 8    Number of Visits 12    Date for PT Re-Evaluation 01/07/22    Authorization Type [employer]    Authorization Time Period 11/12/21-01/07/22    PT Start Time 0950    PT Stop Time 1030    PT Time Calculation (min) 40 min    Activity Tolerance Patient tolerated treatment well    Behavior During Therapy [facility] for tasks assessed/performed                 Past Medical History:  Diagnosis Date   Acute acalculous cholecystitis 12/04/2014   Acute cholecystitis 12/04/2014   Chondromalacia of knee    LEFT COMPARTMENT   Giardia    History of cervical dysplasia    History of gestational diabetes    History of uterine fibroid    Hypertension    Palpitations    Shortness of breath    Past Surgical History:  Procedure Laterality Date   CESAREAN SECTION  X2  11-16-2005   CHOLECYSTECTOMY N/A 12/04/2014   Procedure: LAPAROSCOPIC CHOLECYSTECTOMY;  Surgeon: Ralene Ok, MD;  Location: [facility];  Service: General;  Laterality: N/A;   COLPOSCOPY     GYNECOLOGIC CRYOSURGERY  age 49   KNEE ARTHROSCOPY Right 05/25/2015   Procedure: RIGHT KNEE ARTHROSCOPY;  Surgeon: Paralee Cancel, MD;  Location: [facility];  Service: Orthopedics;  Laterality: Right;   MYOMECTOMY ABDOMINAL APPROACH  12-29-2003   NEGATIVE SLEEP STUDY  2015   Patient Active Problem List   Diagnosis Date Noted   Gastroesophageal reflux disease 11/16/2020   Osteoarthritis of right knee 05/08/2019    Essential hypertension 05/25/2018   ADHD 01/06/2017   CTS (carpal tunnel syndrome) 01/06/2017   Asymptomatic microscopic hematuria 02/23/2016   Neck pain 12/28/2015   Anterior knee pain, right 05/21/2015   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 Crist Infante, MD  REFERRING PROVIDER: Derl Barrow, PA  REFERRING DIAG: [phone] (ICD-10-CM) - Unilateral primary osteoarthritis, left knee  THERAPY DIAG:  Other abnormalities of gait and mobility  Chronic pain of left knee  Muscle weakness (generalized)  Chronic pain of right knee  Rationale for Evaluation and Treatment Rehabilitation  ONSET DATE: 2023  SUBJECTIVE:   SUBJECTIVE STATEMENT:   Pt reports no new changes since last visit.   PERTINENT HISTORY: [place] in 2014-2015 slipping in the gym shower falling into knee valgus. There was no grip attaching the mat to the floor. Swelling both  knees. Saw MD for acute injury . Meniscus damaged on both sides R > L.She has recently  been told she knees a total knee; she is ready for surgery but MD wants her to wait.  Has had sterioid injections, gel injections with last being in late June 2023.  Pain both knees upon locking in of knees after transfer  PAIN:  Are you having pain? Not currently but pain with walking and descending stairs with pain in knee getting up,  to 3/10   PRECAUTIONS: None  WEIGHT BEARING RESTRICTIONS No  FALLS:  Has patient fallen in last 6 months? No  LIVING ENVIRONMENT: Lives with: lives  with their family Lives in: House/apartment Has following equipment at home: None  OCCUPATION: Physician  PLOF: Independent  PATIENT GOALS walk without pain  TODAY'S TREATMENT 10/6 Pt seen for aquatic therapy today.  Treatment took place in water 3.25-4.5 ft in depth at the [facility]. Temp of water was 93.  Pt entered/exited the pool via steps with hand rail.  Quad stretch with LE supported by thin square noodle Forward single leg leans with back leg supported by noodle Hamstring, ITB and adductor stretch with LE supported by thin square noodle, back against wall Lateral heel taps on 1st step, with hip hinge R/L Forward runners step ups on 2nd step R/L Heel raises, heels off step x 10; repeated with toes turned out x 10 Walking split squats, then with added forward kick Side squat with arm add/abdct Tandem stance without UE and eyes closed SLS with 3 way leg kick with increased speed, cues to minimize height of leg Suspended on yellow noodle with yellow hand buoys: cycling 4 way pendulum x 2 reps each direction holding yellow hand buoys on surface    9/22 Pt seen for aquatic therapy today.  Treatment took place in water 3.25-4.5 ft in depth at the [facility]. Temp of water was 91.  Pt entered/exited the pool via steps with hand rail.  Seated flutter (SLR);add/abd 3x20-25reps Seated Add/abd 3 x20 CKC TKE R&L 2x12 rising onto bottom step Adductor set using BB x5 hold x10s STS with add set 4th step x10  Plank hip ext 2x10. Cues for abdominal bracing and glut contraction with hip extension Quad stretch kneeling on bottom step Runners stretch hamstring/gastroc bench Side lunges with add/abd ue using yellow hand buoys x 4widths; forward and backward lunges x 2 widths ea Cursy squats 63f ue support yellow hand buoys  Squats bottom step x 10 cues for weight through heels Retro Step ups onto bottom steps then forward onto 2nd step x 7-8 reps R/L.  Pt  reports some pressure medial aspect of right knee Pt swimming for recovery between exercises Cycling on yellow noodle indep end of session  Pt requires the buoyancy and hydrostatic pressure of water for support, and to offload joints by unweighting joint load by at least 50 % in navel deep water and by at least 75-80% in chest to neck deep water.  Viscosity of the water is needed for resistance of strengthening. Water current perturbations provides challenge to standing balance requiring increased core activation.    8/25 Nustep L6x6 minutes BLEs only  Objective measures and recertification- education as appropriate   Hip hikes x10 green TB  U bridge x10 B  Walking bridges x10   8/4 Reviewed and updated HEP   Bridges 1x15 green TB 5 second holds at the top SL SLR 2x10 each side  SLR 2x10  Quad set 2x10   Standing heel raise x20  Standing slow march with grade UE support Hip  extension 2x10     7/21 FOTO 40  Bridges 1x15 red TB 5 second holds at the top Sidelying clams 1x15 B red TB  Prone hip extensions 1x10 B red TB  Walking bridges 2x5 B  SLR with ER 1x10 B for VMO activation   Patella mobility B knees all directions as tolerated (L much less immobile than R)  STS with red TB around knees 1x10   HS and piriformis stretches 2x30 seconds B each      OBJECTIVE:   DIAGNOSTIC FINDINGS: 03/03/20 Xray R knee: Degenerative changes similar to that seen on previous exam in 2020.  PATIENT SURVEYS:  FOTO - 40  COGNITION:  Overall cognitive status: Within functional limits for tasks assessed     SENSATION: WFL  EDEMA:  none   POSTURE:  L quad activation with standing, R hip internal rotation with gait, increased WB L with gait  PALPATION: R patella laterally tracks with quad set, R patella mobility limited all directions, L patella mobility limited but medial/lateral more mobile than superior/inferior. L ITB/quad tighter with TTP L ITB. No extension lag with  SLR  LOWER EXTREMITY ROM:  Passive ROM Right eval Left eval  Hip flexion [facility]   Hip extension [facility]  Hip abduction [facility]  Hip adduction [facility]  Hip internal rotation [facility] WFL  Hip external rotation [facility] WFL with discomfort reported and restriction palpated  Knee flexion [facility]  Knee extension [facility]  Ankle dorsiflexion [facility]  Ankle plantarflexion    Ankle inversion    Ankle eversion     (Blank rows = not tested) Knee AROM WNL; L hip flexion impaired and uncomfortable  LOWER EXTREMITY MMT:  MMT Right eval Left eval R 8/25 L 8/25  Hip flexion 4+/5 2-/5 5 4-  Hip extension 2+/5 2+/5 3- 3-  Hip abduction 4/5 4+/5 4+ 5  Hip adduction 4/5 2+/5    Hip internal rotation      Hip external rotation      Knee flexion 4+/5 4/5 4+ 4+  Knee extension 4+/5 4/_0 Ankle dorsiflexion 5/5 5/5    Ankle plantarflexion      Ankle inversion      Ankle eversion       (Blank rows = not tested)  LOWER EXTREMITY SPECIAL TESTS:  Hip special tests: Saralyn Pilar (FABER) test: positive , Thomas test: positive , and Ober's test: positive  all positive on L. SLR + for Hamstring tightness bil LEs   GAIT: Distance walked: in gym Assistive device utilized: None Level of assistance: Complete Independence Comments: increased WB L with gait         PATIENT EDUCATION:  Education details: aquatic progressions Person educated: Patient Education method: Consulting civil engineer, Media planner, and Handouts Education comprehension: verbalized understanding and returned demonstration   HOME EXERCISE PROGRAM: Access Code: [email or web address] URL: [email or web address] Date: 10/01/2021 Prepared by: America Brown  Exercises - Seated Hamstring Stretch  - 1 x daily - 7 x weekly - 1 sets - 2 reps - 30 sec hold - Standing Hip Flexor Stretch  - 1 x daily - 7 x weekly - 1 sets - 2 reps - 30 sec hold - Supine ITB Stretch  - 1 x daily - 7 x weekly - 1 sets - 2 reps - 30 sec  hold  ASSESSMENT:  CLINICAL IMPRESSION: Pt executes exercises well but continues to need cuing for mindful movements/slowing speed of activity. No pain reported throughout session other than  a little right medial knee pressure with deeper squats. Pt is progressing towards goals.  Plan to bring green band to next session for pt to trial in the water (ie: resisted side stepping), per pt request.     OBJECTIVE IMPAIRMENTS Abnormal gait, decreased activity tolerance, decreased endurance, decreased mobility, difficulty walking, decreased strength, impaired flexibility, and pain.   ACTIVITY LIMITATIONS sitting, standing, squatting, stairs, transfers, and locomotion level  PARTICIPATION LIMITATIONS: community activity and occupation  [place] Past/current experiences are also affecting patient's functional outcome.   REHAB POTENTIAL: Good  CLINICAL DECISION MAKING: Evolving/moderate complexity  EVALUATION COMPLEXITY: Moderate   GOALS: Goals reviewed with patient? Yes  SHORT TERM GOALS: Target date: 01/14/2022  Pt will be indep with initial HEP to assist with bil knee pain management  for mobility at work Baseline:initiated at eval Goal status: MET  2.  Pt will report 25% improvement in L knee pain with walking Baseline: 8/25- 25% improvement L, R still painful  Goal status: MET    LONG TERM GOALS: Target date: 02/04/2022   Pt will be able to walk x 15 min </=3/10 bil knee pain for neighborhood walking Baseline: 8/25- endurance is better pain is still a problem  Goal status: IN PROGRESS  2.  Pt will be able to walk at work with 75% less difficulty Baseline: 8/25- but tends to cruise around on rolling chair Goal status: MET  3.  Pt will be able to descend stairs  with </= 3/10 Baseline:  Goal status: MET  4.  Pt will be able to walk without holding onto a cart to shop Baseline:  Goal status: MET  5.  Pt will demo improvement in bil LE strengthening >/=1 MMT  grade to assist with overall mobility.  Baseline:  MMT Right eval Left eval R 8/25 L 8/25  Hip flexion 4+/5 2-/5 5 4-  Hip extension 2+/5 2+/5 3- 3-  Hip abduction 4/5 4+/5 4+ 5  Hip adduction 4/5 2+/5    Hip internal rotation      Hip external rotation      Knee flexion 4+/5 4/5 4+ 4+  Knee extension 4+/5 4/_0 Ankle dorsiflexion 5/5 5/5    Ankle plantarflexion      Ankle inversion      Ankle eversion       (Blank rows = not tested) Goal status: IN PROGRESS     PLAN: PT FREQUENCY: 1x/week  PT DURATION: 8 weeks  PLANNED INTERVENTIONS: Therapeutic exercises, Therapeutic activity, Neuromuscular re-education, Gait training, Patient/Family education, Stair training, Aquatic Therapy, Dry Needling, Cryotherapy, Moist heat, Taping, Vasopneumatic device, Ultrasound, Manual therapy, and Re-evaluation  PLAN FOR NEXT SESSION: review HEP, patella mobility bil LEs, bil LE strengthening, L hip flexibility therex, stretch R hip internal rotators.Progress CKC strength in water.   Kerin Perna, PTA 12/24/21 2:21 PM [facility] [address] [place], [place], [address] Phone: [phone]   Fax:  [phone]

## 2021-12-27 ENCOUNTER — Other Ambulatory Visit: Payer: Self-pay | Admitting: Neurology

## 2021-12-27 DIAGNOSIS — E1149 Type 2 diabetes mellitus with other diabetic neurological complication: Secondary | ICD-10-CM

## 2021-12-27 DIAGNOSIS — I159 Secondary hypertension, unspecified: Secondary | ICD-10-CM

## 2021-12-27 MED ORDER — MOUNJARO 7.5 MG/0.5ML ~~LOC~~ SOAJ: 7.5000 mg | SUBCUTANEOUS | 3 refills | Status: DC

## 2021-12-31 ENCOUNTER — Encounter (HOSPITAL_BASED_OUTPATIENT_CLINIC_OR_DEPARTMENT_OTHER): Payer: Self-pay | Admitting: Physical Therapy

## 2021-12-31 ENCOUNTER — Ambulatory Visit (HOSPITAL_BASED_OUTPATIENT_CLINIC_OR_DEPARTMENT_OTHER): Payer: PRIVATE HEALTH INSURANCE | Admitting: Physical Therapy

## 2021-12-31 DIAGNOSIS — G8929 Other chronic pain: Secondary | ICD-10-CM

## 2021-12-31 DIAGNOSIS — M6281 Muscle weakness (generalized): Secondary | ICD-10-CM

## 2021-12-31 DIAGNOSIS — R2689 Other abnormalities of gait and mobility: Secondary | ICD-10-CM

## 2021-12-31 NOTE — Therapy (Signed)
[facility] [address] 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 Date of Birth: 07/28/1965 Referring Provider:  Crist Infante, MD  Encounter Date: 12/31/2021   OUTPATIENT PHYSICAL THERAPY LOWER EXTREMITY TREATMENT   Patient Name: Martha Brunke, MD MRN: [id number] DOB:01-Mar-1966, 56 y.o., female Today's Date: 12/31/2021   PT End of Session - 12/31/21 1240     Visit Number 9    Number of Visits 12    Date for PT Re-Evaluation 01/07/22    Authorization Type [employer]    Authorization Time Period 11/12/21-01/07/22    PT Start Time 0945    PT Stop Time 1028    PT Time Calculation (min) 43 min    Activity Tolerance Patient tolerated treatment well    Behavior During Therapy [facility] for tasks assessed/performed                  Past Medical History:  Diagnosis Date   Acute acalculous cholecystitis 12/04/2014   Acute cholecystitis 12/04/2014   Chondromalacia of knee    LEFT COMPARTMENT   Giardia    History of cervical dysplasia    History of gestational diabetes    History of uterine fibroid    Hypertension    Palpitations    Shortness of breath    Past Surgical History:  Procedure Laterality Date   CESAREAN SECTION  X2  11-16-2005   CHOLECYSTECTOMY N/A 12/04/2014   Procedure: LAPAROSCOPIC CHOLECYSTECTOMY;  Surgeon: Ralene Ok, MD;  Location: [facility];  Service: General;  Laterality: N/A;   COLPOSCOPY     GYNECOLOGIC CRYOSURGERY  age 69   KNEE ARTHROSCOPY Right 05/25/2015   Procedure: RIGHT KNEE ARTHROSCOPY;  Surgeon: Paralee Cancel, MD;  Location: [facility];  Service: Orthopedics;  Laterality: Right;   MYOMECTOMY ABDOMINAL APPROACH  12-29-2003   NEGATIVE SLEEP STUDY  2015   Patient Active Problem List   Diagnosis Date Noted   Gastroesophageal reflux disease 11/16/2020   Osteoarthritis of right knee 05/08/2019    Essential hypertension 05/25/2018   ADHD 01/06/2017   CTS (carpal tunnel syndrome) 01/06/2017   Asymptomatic microscopic hematuria 02/23/2016   Neck pain 12/28/2015   Anterior knee pain, right 05/21/2015   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 Crist Infante, MD  REFERRING PROVIDER: Derl Barrow, PA  REFERRING DIAG: [id number] (ICD-10-CM) - Unilateral primary osteoarthritis, left knee  THERAPY DIAG:  Other abnormalities of gait and mobility  Muscle weakness (generalized)  Chronic pain of left knee  Chronic pain of right knee  Rationale for Evaluation and Treatment Rehabilitation  ONSET DATE: 2023  SUBJECTIVE:   SUBJECTIVE STATEMENT:   Pt reports she has been stretching and exercising.  She notices her knee (pain) when descending the stairs.  She is ready for her TKR on 11/28.     PERTINENT HISTORY: [place] in 2014-2015 slipping in the gym shower falling into knee valgus. There was no grip attaching the mat to  the floor. Swelling both  knees. Saw MD for acute injury . Meniscus damaged on both sides R > L.She has recently been told she knees a total knee; she is ready for surgery but MD wants her to wait.  Has had sterioid injections, gel injections with last being in late June 2023.  Pain both knees upon locking in of knees after transfer  PAIN:  Are you having pain? Not currently but pain with walking and descending stairs with pain in knee getting up,  to 3/10   PRECAUTIONS: None  WEIGHT BEARING  RESTRICTIONS No  FALLS:  Has patient fallen in last 6 months? No  LIVING ENVIRONMENT: Lives with: lives with their family Lives in: House/apartment Has following equipment at home: None  OCCUPATION: Physician  PLOF: Independent  PATIENT GOALS walk without pain  TODAY'S TREATMENT 10/13 Pt seen for aquatic therapy today.  Treatment took place in water 3.25-4.5 ft in depth at the [facility]. Temp of water was 93.  Pt entered/exited the pool via steps with hand rail.  Warm up of walking and flutter kick swimming Walking forward split squats Forward walking kicks  Forward  single LE leans with UE on yellow noodle x 10 each LE Single leg press (slow stomp) with thin square noodle (too easy) , then 10-15 with yellow noodle - repeated with hip abdct/knee flex Lateral heel taps on 1st step, with hip hinge R/L  x 10 Heel raises with heels off step-- feet together x 10; toes turned out x 10; wide feet pointed forward x 10 Calf stretch x 20s x 2 Suspended on yellow noodle with yellow hand buoys: cycling, cc ski, jumping jack LE Quad stretch with LE supported by thin square noodle -> yellow noodle Hamstring, ITB and adductor stretch with LE supported by yellow noodle, back against wall  10/6 Pt seen for aquatic therapy today.  Treatment took place in water 3.25-4.5 ft in depth at the [facility]. Temp of water was 93.  Pt entered/exited the pool via steps with hand rail.  Quad stretch with LE supported by thin square noodle Forward single leg leans with back leg supported by noodle Hamstring, ITB and adductor stretch with LE supported by thin square noodle, back against wall Lateral heel taps on 1st step, with hip hinge R/L Forward runners step ups on 2nd step R/L Heel raises, heels off step x 10; repeated with toes turned out x 10 Walking split squats, then with added forward kick Side squat with arm add/abdct Tandem stance without UE and eyes closed SLS  with 3 way leg kick with increased speed, cues to minimize height of leg Suspended on yellow noodle with yellow hand buoys: cycling 4 way pendulum x 2 reps each direction holding yellow hand buoys on surface    9/22 Pt seen for aquatic therapy today.  Treatment took place in water 3.25-4.5 ft in depth at the [facility]. Temp of water was 91.  Pt entered/exited the pool via steps with hand rail.  Seated flutter (SLR);add/abd 3x20-25reps Seated Add/abd 3 x20 CKC TKE R&L 2x12 rising onto bottom step Adductor set using BB x5 hold x10s STS with add set 4th step x10  Plank hip ext 2x10. Cues for abdominal bracing and glut contraction with hip extension Quad stretch kneeling on bottom step Runners stretch hamstring/gastroc bench Side lunges with add/abd ue using yellow hand buoys x 4widths; forward and backward lunges x 2 widths ea Cursy squats 47f ue support yellow  hand buoys  Squats bottom step x 10 cues for weight through heels Retro Step ups onto bottom steps then forward onto 2nd step x 7-8 reps R/L.  Pt reports some pressure medial aspect of right knee Pt swimming for recovery between exercises Cycling on yellow noodle indep end of session  Pt requires the buoyancy and hydrostatic pressure of water for support, and to offload joints by unweighting joint load by at least 50 % in navel deep water and by at least 75-80% in chest to neck deep water.  Viscosity of the water is needed for resistance of strengthening. Water current perturbations provides challenge to standing balance requiring increased core activation.    8/25 Nustep L6x6 minutes BLEs only  Objective measures and recertification- education as appropriate   Hip hikes x10 green TB  U bridge x10 B  Walking bridges x10   8/4 Reviewed and updated HEP   Bridges 1x15 green TB 5 second holds at the top SL SLR 2x10 each side  SLR 2x10  Quad set 2x10   Standing heel raise x20  Standing slow march with  grade UE support Hip extension 2x10     7/21 FOTO 40  Bridges 1x15 red TB 5 second holds at the top Sidelying clams 1x15 B red TB  Prone hip extensions 1x10 B red TB  Walking bridges 2x5 B  SLR with ER 1x10 B for VMO activation   Patella mobility B knees all directions as tolerated (L much less immobile than R)  STS with red TB around knees 1x10   HS and piriformis stretches 2x30 seconds B each      OBJECTIVE: ( findings taken at evaluation unless otherwise noted)   DIAGNOSTIC FINDINGS: 03/03/20 Xray R knee: Degenerative changes similar to that seen on previous exam in 2020.  PATIENT SURVEYS:  FOTO - 40  COGNITION:  Overall cognitive status: Within functional limits for tasks assessed     SENSATION: WFL  EDEMA:  none   POSTURE:  L quad activation with standing, R hip internal rotation with gait, increased WB L with gait  PALPATION: R patella laterally tracks with quad set, R patella mobility limited all directions, L patella mobility limited but medial/lateral more mobile than superior/inferior. L ITB/quad tighter with TTP L ITB. No extension lag with SLR  LOWER EXTREMITY ROM:  Passive ROM Right eval Left eval  Hip flexion [facility]   Hip extension [facility]  Hip abduction [facility]  Hip adduction [facility]  Hip internal rotation [facility] WFL  Hip external rotation [facility] WFL with discomfort reported and restriction palpated  Knee flexion [facility]  Knee extension [facility]  Ankle dorsiflexion [facility]  Ankle plantarflexion    Ankle inversion    Ankle eversion     (Blank rows = not tested) Knee AROM WNL; L hip flexion impaired and uncomfortable  LOWER EXTREMITY MMT:  MMT Right eval Left eval R 8/25 L 8/25  Hip flexion 4+/5 2-/5 5 4-  Hip extension 2+/5 2+/5 3- 3-  Hip abduction 4/5 4+/5 4+ 5  Hip adduction 4/5 2+/5    Hip internal rotation      Hip external rotation      Knee flexion 4+/5 4/5 4+ 4+  Knee extension 4+/5 4/'5 5 5  ' Ankle dorsiflexion 5/5 5/5     Ankle plantarflexion      Ankle inversion      Ankle eversion       (Blank rows = not tested)  LOWER EXTREMITY SPECIAL TESTS:  Hip special tests: Saralyn Pilar (FABER) test: positive , Thomas test: positive , and Ober's test: positive  all positive on L. SLR + for Hamstring tightness bil LEs   GAIT: Distance walked: in gym Assistive device utilized: None Level of assistance: Complete Independence Comments: increased WB L with gait         PATIENT EDUCATION:  Education details: aquatic progressions Person educated: Patient Education method: Consulting civil engineer, Media planner, and Handouts Education comprehension: verbalized understanding and returned demonstration   HOME EXERCISE PROGRAM: Access Code: [email or web address] URL: [email or web address] Date: 10/01/2021 Prepared by: America Brown  Exercises - Seated Hamstring Stretch  - 1 x daily - 7 x weekly - 1 sets - 2 reps - 30 sec hold - Standing Hip Flexor Stretch  - 1 x daily - 7 x weekly - 1 sets - 2 reps - 30 sec hold - Supine ITB Stretch  - 1 x daily - 7 x weekly - 1 sets - 2 reps - 30 sec hold  ASSESSMENT:  CLINICAL IMPRESSION: Occasional cuing for mindful movements/slowing speed of activity. No pain reported throughout session other than mild pain with Lateral heel taps due to depth of squats. Pt is progressing towards goals.  Next session is last scheduled aquatic appt; Plan to assess goals and need for additional sessions vs d/c to HEP.     OBJECTIVE IMPAIRMENTS Abnormal gait, decreased activity tolerance, decreased endurance, decreased mobility, difficulty walking, decreased strength, impaired flexibility, and pain.   ACTIVITY LIMITATIONS sitting, standing, squatting, stairs, transfers, and locomotion level  PARTICIPATION LIMITATIONS: community activity and occupation  [place] Past/current experiences are also affecting patient's functional outcome.   REHAB POTENTIAL: Good  CLINICAL DECISION MAKING:  Evolving/moderate complexity  EVALUATION COMPLEXITY: Moderate   GOALS: Goals reviewed with patient? Yes  SHORT TERM GOALS: Target date: 10/22/21 Pt will be indep with initial HEP to assist with bil knee pain management  for mobility at work Baseline:initiated at eval Goal status: MET  2.  Pt will report 25% improvement in L knee pain with walking Baseline: 8/25- 25% improvement L, R still painful  Goal status: MET    LONG TERM GOALS: Target date: 01/07/22  Pt will be able to walk x 15 min </=3/10 bil knee pain for neighborhood walking Baseline: 8/25- endurance is better pain is still a problem  Goal status: IN PROGRESS  2.  Pt will be able to walk at work with 75% less difficulty Baseline: 8/25- but tends to cruise around on rolling chair Goal status: MET  3.  Pt will be able to descend stairs  with </= 3/10 Baseline:  Goal status: MET  4.  Pt will be able to walk without holding onto a cart to shop Baseline:  Goal status: MET  5.  Pt will demo improvement in bil LE strengthening >/=1 MMT grade to assist with overall mobility.  Baseline:  MMT Right eval Left eval R 8/25 L 8/25  Hip flexion 4+/5 2-/5 5 4-  Hip extension 2+/5 2+/5 3- 3-  Hip abduction 4/5 4+/5 4+ 5  Hip adduction 4/5 2+/5    Hip internal rotation      Hip external rotation      Knee flexion 4+/5 4/5 4+ 4+  Knee extension 4+/5 4/'5 5 5  ' Ankle dorsiflexion 5/5 5/5    Ankle plantarflexion      Ankle inversion      Ankle eversion       (Blank rows = not tested) Goal  status: IN PROGRESS     PLAN: PT FREQUENCY: 1x/week  PT DURATION: 8 weeks  PLANNED INTERVENTIONS: Therapeutic exercises, Therapeutic activity, Neuromuscular re-education, Gait training, Patient/Family education, Stair training, Aquatic Therapy, Dry Needling, Cryotherapy, Moist heat, Taping, Vasopneumatic device, Ultrasound, Manual therapy, and Re-evaluation  PLAN FOR NEXT SESSION: see above.   Kerin Perna,  PTA 12/31/21 12:44 PM [facility] [address] [place], [place], [address] Phone: [phone]   Fax:  [phone]

## 2022-01-07 ENCOUNTER — Ambulatory Visit (HOSPITAL_BASED_OUTPATIENT_CLINIC_OR_DEPARTMENT_OTHER): Payer: PRIVATE HEALTH INSURANCE | Admitting: Physical Therapy

## 2022-01-07 ENCOUNTER — Encounter (HOSPITAL_BASED_OUTPATIENT_CLINIC_OR_DEPARTMENT_OTHER): Payer: Self-pay | Admitting: Physical Therapy

## 2022-01-07 ENCOUNTER — Other Ambulatory Visit: Payer: PRIVATE HEALTH INSURANCE

## 2022-01-07 DIAGNOSIS — R2689 Other abnormalities of gait and mobility: Secondary | ICD-10-CM | POA: Diagnosis not present

## 2022-01-07 DIAGNOSIS — M6281 Muscle weakness (generalized): Secondary | ICD-10-CM

## 2022-01-07 DIAGNOSIS — G8929 Other chronic pain: Secondary | ICD-10-CM

## 2022-01-07 DIAGNOSIS — R6882 Decreased libido: Secondary | ICD-10-CM

## 2022-01-07 NOTE — Therapy (Signed)
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 Patient goals were met. Patient is being discharged due to meeting the stated rehab goals.  OUTPATIENT PHYSICAL THERAPY LOWER EXTREMITY TREATMENT   Patient Name: Martha Halbur, MD MRN: [id number] DOB:[birth year], 56 y.o., female Today's Date: 01/07/2022   PT End of Session - 01/07/22 0946     Visit Number 10    Number of Visits 12    Date for PT Re-Evaluation 01/07/22    Authorization Type [employer]    Authorization Time Period 11/12/21-01/07/22    Progress Note Due on Visit 10    PT Start Time 0947    PT Stop Time 1030    PT Time Calculation (min) 43 min    Activity Tolerance Patient tolerated treatment well    Behavior During Therapy [facility] for tasks assessed/performed                   Past Medical History:  Diagnosis Date   Acute acalculous cholecystitis 12/04/2014   Acute cholecystitis 12/04/2014   Chondromalacia of knee    LEFT COMPARTMENT   Giardia    History of cervical dysplasia    History of gestational diabetes    History of uterine fibroid    Hypertension    Palpitations    Shortness of breath    Past Surgical History:  Procedure Laterality Date   CESAREAN SECTION  X2  11-16-2005   CHOLECYSTECTOMY N/A 12/04/2014   Procedure: LAPAROSCOPIC CHOLECYSTECTOMY;  Surgeon: Ralene Ok, MD;  Location: [facility];  Service: General;  Laterality: N/A;    COLPOSCOPY     GYNECOLOGIC CRYOSURGERY  age 50   KNEE ARTHROSCOPY Right 05/25/2015   Procedure: RIGHT KNEE ARTHROSCOPY;  Surgeon: Paralee Cancel, MD;  Location: [facility];  Service: Orthopedics;  Laterality: Right;   MYOMECTOMY ABDOMINAL APPROACH  12-29-2003   NEGATIVE SLEEP STUDY  2015   Patient Active Problem List   Diagnosis Date Noted   Gastroesophageal reflux disease 11/16/2020   Osteoarthritis of right knee 05/08/2019   Essential hypertension 05/25/2018   ADHD 01/06/2017   CTS (carpal tunnel syndrome) 01/06/2017   Asymptomatic microscopic hematuria 02/23/2016   Neck pain 12/28/2015   Anterior knee pain, right 05/21/2015   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 Crist Infante, MD  REFERRING PROVIDER: Derl Barrow, PA  REFERRING DIAG: [phone] (ICD-10-CM) - Unilateral primary osteoarthritis, left knee  THERAPY DIAG:  Muscle weakness (generalized)  Chronic pain of left knee  Chronic pain of right knee  Other abnormalities of gait and mobility  Rationale for Evaluation and Treatment Rehabilitation  ONSET DATE: 2023  SUBJECTIVE:   SUBJECTIVE STATEMENT:   Pt reports she felt good after last session.  Intensity was good.    PERTINENT HISTORY: [place] in 2014-2015 slipping in the gym shower falling into knee valgus. There was no grip attaching the mat to the floor. Swelling both  knees. Saw MD for acute injury . Meniscus damaged on both sides R > L.She has recently been told she knees a total  knee; she is ready for surgery but MD wants her to wait.  Has had sterioid injections, gel injections with last being in late June 2023.  Pain both knees upon locking in of knees after transfer  PAIN:  Are you having pain? Not currently but pain with walking and descending stairs with pain in knee getting up,  to 3/10   PRECAUTIONS: None  WEIGHT BEARING RESTRICTIONS No  FALLS:  Has patient fallen in last 6 months? No  LIVING ENVIRONMENT: Lives with: lives with their family Lives in: House/apartment Has following equipment at home: None  OCCUPATION: Physician  PLOF: Independent  PATIENT GOALS walk without pain  TODAY'S TREATMENT  10/20  objective testing  Pt seen for aquatic therapy today.  Treatment took place in water 3.25-4.5 ft in depth at the [facility]. Temp of water was 93.  Pt entered/exited the pool via steps with hand rail.  Warm up of walking and flutter kick swimming Walking forward split squats  Forward  single LE leans with UE on yellow noodle x 10 each LE Single leg press (slow stomp) with thin square noodle (too easy) , then 10-15 with yellow noodle - repeated with hip abdct/knee flex             Heel raises with heels off step-- feet together x 10; toes turned out x 10; wide feet pointed forward x 10 Calf stretch x 20s x 2 Suspended on yellow noodle with yellow hand buoys: cycling, cc ski, jumping jack LE Quad stretch kneeling on bottom water step Hamstring, ITB and adductor stretch with LE supported by yellow noodle, back against wall    OBJECTIVE: ( findings taken at evaluation unless otherwise noted)   DIAGNOSTIC FINDINGS: 03/03/20 Xray R knee: Degenerative changes similar to that seen on previous exam in 2020.  PATIENT SURVEYS:  FOTO - 40  COGNITION:  Overall cognitive status: Within functional limits for tasks assessed     SENSATION: WFL  EDEMA:  none   POSTURE:  L quad activation with standing, R hip internal rotation  with gait, increased WB L with gait  PALPATION: R patella laterally tracks with quad set, R patella mobility limited all directions, L patella mobility limited but medial/lateral more mobile than superior/inferior. L ITB/quad tighter with TTP L ITB. No extension lag with SLR  LOWER EXTREMITY ROM:  Passive ROM Right eval Left eval  Hip flexion [facility]   Hip extension [facility]  Hip abduction [facility]  Hip adduction [facility]  Hip internal rotation [facility] WFL  Hip external rotation [facility] WFL with discomfort reported and restriction palpated  Knee flexion [facility]  Knee extension [facility]  Ankle dorsiflexion [facility]  Ankle plantarflexion    Ankle inversion    Ankle eversion     (Blank rows = not tested) Knee AROM WNL; L hip flexion impaired and uncomfortable  LOWER EXTREMITY MMT:  MMT Right eval Left eval R 8/25 L  8/25  Hip flexion 4+/5 2-/5 5 4-  Hip extension 2+/5 2+/5 3- 3-  Hip abduction 4/5 4+/5 4+ 5  Hip adduction 4/5 2+/5    Hip internal rotation      Hip external rotation      Knee flexion 4+/5 4/5 4+ 4+  Knee extension 4+/5 4/'5 5 5  ' Ankle dorsiflexion 5/5 5/5    Ankle plantarflexion      Ankle inversion      Ankle eversion       (Blank rows = not tested)  LOWER EXTREMITY SPECIAL TESTS:  Hip special tests: Saralyn Pilar (FABER) test: positive , Thomas test: positive , and Ober's test: positive  all positive on L. SLR + for Hamstring tightness bil LEs   GAIT: Distance walked: in gym Assistive device utilized: None Level of assistance: Complete Independence Comments: increased WB L with gait         PATIENT EDUCATION:  Education details: aquatic progressions Person educated: Patient Education method: Consulting civil engineer, Media planner, and Handouts Education comprehension: verbalized understanding and returned demonstration   HOME EXERCISE PROGRAM: Access Code: [email or web address] URL: [email or web address] Date: 10/01/2021 Prepared by: America Brown  Exercises -  Seated Hamstring Stretch  - 1 x daily - 7 x weekly - 1 sets - 2 reps - 30 sec hold - Standing Hip Flexor Stretch  - 1 x daily - 7 x weekly - 1 sets - 2 reps - 30 sec hold - Supine ITB Stretch  - 1 x daily - 7 x weekly - 1 sets - 2 reps - 30 sec hold  ASSESSMENT:  CLINICAL IMPRESSION: TKR right is planned for just after Thanksgiving.  She reports she enjoyed the intensity of the last session.  Did have some ms fatigue afterwards.She is directed through session given mostly VC for slowed and purposeful movement. She has met all of her goals except pain with amb.  Her endurance/toleration to activity is not limiting and has improved.  She is now a member of [employer] with intension to continue with aquatic HEP before and after surgical procedure.  Pt DC from services     OBJECTIVE IMPAIRMENTS Abnormal gait, decreased activity tolerance, decreased endurance, decreased mobility, difficulty walking, decreased strength, impaired flexibility, and pain.   ACTIVITY LIMITATIONS sitting, standing, squatting, stairs, transfers, and locomotion level  PARTICIPATION LIMITATIONS: community activity and occupation  [place] Past/current experiences are also affecting patient's functional outcome.   REHAB POTENTIAL: Good  CLINICAL DECISION MAKING: Evolving/moderate complexity  EVALUATION COMPLEXITY: Moderate   GOALS: Goals reviewed with patient? Yes  SHORT TERM GOALS: Target date: 10/22/21 Pt will be indep with initial HEP to assist with bil knee pain management  for mobility at work Baseline:initiated at eval Goal status: MET  2.  Pt will report 25% improvement in L knee pain with walking Baseline: 8/25- 25% improvement L, R still painful  Goal status: MET    LONG TERM GOALS: Target date: 01/07/22  Pt will be able to walk x 15 min </=3/10 bil knee pain for neighborhood walking Baseline: 8/25- endurance is better pain is still a problem  Goal status: Partially met.  2.  Pt will be  able to walk at work with 75% less difficulty Baseline: 8/25- but tends to cruise around on rolling chair Goal status: MET  3.  Pt will be able to descend stairs  with </= 3/10 Baseline:  Goal status: MET  4.  Pt will be able to walk without holding onto a cart to shop Baseline:  Goal status: MET  5.  Pt will demo improvement in bil LE strengthening >/=1 MMT grade to assist with overall mobility.  Baseline:  MMT Right eval Left eval R 8/25 L 8/25 Right / Left 01/07/22  Hip flexion 4+/5 2-/5 5 4- 5  4+/5  Hip extension 2+/5 2+/5 3- 3-   Hip abduction 4/5 4+/5 4+ 5   Hip adduction 4/5 2+/5     Hip internal rotation       Hip external rotation       Knee flexion 4+/5 4/5 4+ 4+ 4+/5-  Knee extension 4+/5 4/'5 5 5   ' Ankle dorsiflexion 5/5 5/5     Ankle plantarflexion       Ankle inversion       Ankle eversion        (Blank rows = not tested) Goal status: IN PROGRESS     PLAN: PT FREQUENCY: 1x/week  PT DURATION: 8 weeks  PLANNED INTERVENTIONS: Therapeutic exercises, Therapeutic activity, Neuromuscular re-education, Gait training, Patient/Family education, Stair training, Aquatic Therapy, Dry Needling, Cryotherapy, Moist heat, Taping, Vasopneumatic device, Ultrasound, Manual therapy, and Re-evaluation  PLAN FOR NEXT SESSION: see above.   Stanton Kidney [place]) Lamica Mccart MPT 01/07/22 10:41 AM [facility] [address] [place], [place], [address] Phone: [phone]   Fax:  [phone]

## 2022-01-12 DIAGNOSIS — R6882 Decreased libido: Secondary | ICD-10-CM

## 2022-01-12 LAB — TESTOS,TOTAL,FREE AND SHBG (FEMALE)
Free Testosterone: 2.8 pg/mL (ref 0.1–6.4)
Sex Hormone Binding: 96.4 nmol/L — ABNORMAL HIGH (ref 14–73)
Testosterone, Total, LC-MS-MS: 43 ng/dL (ref 2–45)

## 2022-01-13 NOTE — Telephone Encounter (Signed)
Please advise how you want me to order LFT's?  CMET? ALT/AST?  Also, what to use for diagnosis for these labs.  Please see patient's return message as well. Thanks!

## 2022-01-14 ENCOUNTER — Other Ambulatory Visit: Payer: PRIVATE HEALTH INSURANCE

## 2022-01-14 DIAGNOSIS — R6882 Decreased libido: Secondary | ICD-10-CM

## 2022-01-15 LAB — HEPATIC FUNCTION PANEL
AG Ratio: 2.1 (calc) (ref 1.0–2.5)
ALT: 13 U/L (ref 6–29)
AST: 14 U/L (ref 10–35)
Albumin: 4.6 g/dL (ref 3.6–5.1)
Alkaline phosphatase (APISO): 61 U/L (ref 37–153)
Bilirubin, Direct: 0.1 mg/dL (ref 0.0–0.2)
Globulin: 2.2 g/dL (calc) (ref 1.9–3.7)
Indirect Bilirubin: 0.4 mg/dL (calc) (ref 0.2–1.2)
Total Bilirubin: 0.5 mg/dL (ref 0.2–1.2)
Total Protein: 6.8 g/dL (ref 6.1–8.1)

## 2022-01-15 LAB — THYROID PANEL WITH TSH
Free Thyroxine Index: 2.4 (ref 1.4–3.8)
T3 Uptake: 31 % (ref 22–35)
T4, Total: 7.6 ug/dL (ref 5.1–11.9)
TSH: 2.95 mIU/L (ref 0.40–4.50)

## 2022-01-15 LAB — PROLACTIN: Prolactin: 16.9 ng/mL

## 2022-01-17 ENCOUNTER — Other Ambulatory Visit: Payer: PRIVATE HEALTH INSURANCE

## 2022-01-17 NOTE — Telephone Encounter (Signed)
Patient had labs done on 01/14/22

## 2022-01-27 ENCOUNTER — Other Ambulatory Visit: Payer: Self-pay | Admitting: Neurology

## 2022-02-03 ENCOUNTER — Other Ambulatory Visit: Payer: PRIVATE HEALTH INSURANCE

## 2022-02-03 ENCOUNTER — Other Ambulatory Visit: Payer: Self-pay | Admitting: Neurology

## 2022-02-03 DIAGNOSIS — G8929 Other chronic pain: Secondary | ICD-10-CM

## 2022-02-03 DIAGNOSIS — Z01812 Encounter for preprocedural laboratory examination: Secondary | ICD-10-CM

## 2022-02-04 LAB — COMPREHENSIVE METABOLIC PANEL
ALT: 14 IU/L (ref 0–32)
AST: 17 IU/L (ref 0–40)
Albumin/Globulin Ratio: 2.3 — ABNORMAL HIGH (ref 1.2–2.2)
Albumin: 5.1 g/dL — ABNORMAL HIGH (ref 3.8–4.9)
Alkaline Phosphatase: 69 IU/L (ref 44–121)
BUN/Creatinine Ratio: 20 (ref 9–23)
BUN: 21 mg/dL (ref 6–24)
Bilirubin Total: 0.3 mg/dL (ref 0.0–1.2)
CO2: 24 mmol/L (ref 20–29)
Calcium: 10.5 mg/dL — ABNORMAL HIGH (ref 8.7–10.2)
Chloride: 102 mmol/L (ref 96–106)
Creatinine, Ser: 1.05 mg/dL — ABNORMAL HIGH (ref 0.57–1.00)
Globulin, Total: 2.2 g/dL (ref 1.5–4.5)
Glucose: 91 mg/dL (ref 70–99)
Potassium: 4.3 mmol/L (ref 3.5–5.2)
Sodium: 139 mmol/L (ref 134–144)
Total Protein: 7.3 g/dL (ref 6.0–8.5)
eGFR: 62 mL/min/{1.73_m2} (ref >59)

## 2022-02-04 LAB — CBC WITH DIFFERENTIAL/PLATELET
Basophils Absolute: 0 10*3/uL (ref 0.0–0.2)
Basos: 1 %
EOS (ABSOLUTE): 0.1 10*3/uL (ref 0.0–0.4)
Eos: 1 %
Hematocrit: 40.3 % (ref 34.0–46.6)
Hemoglobin: 13.8 g/dL (ref 11.1–15.9)
Immature Grans (Abs): 0 10*3/uL (ref 0.0–0.1)
Immature Granulocytes: 0 %
Lymphocytes Absolute: 2.7 10*3/uL (ref 0.7–3.1)
Lymphs: 33 %
MCH: 31.7 pg (ref 26.6–33.0)
MCHC: 34.2 g/dL (ref 31.5–35.7)
MCV: 93 fL (ref 79–97)
Monocytes Absolute: 0.5 10*3/uL (ref 0.1–0.9)
Monocytes: 7 %
Neutrophils Absolute: 4.8 10*3/uL (ref 1.4–7.0)
Neutrophils: 58 %
Platelets: 230 10*3/uL (ref 150–450)
RBC: 4.35 x10E6/uL (ref 3.77–5.28)
RDW: 13.1 % (ref 11.7–15.4)
WBC: 8.2 10*3/uL (ref 3.4–10.8)

## 2022-02-08 ENCOUNTER — Other Ambulatory Visit: Payer: Self-pay | Admitting: Neurology

## 2022-02-08 DIAGNOSIS — E1169 Type 2 diabetes mellitus with other specified complication: Secondary | ICD-10-CM

## 2022-02-08 MED ORDER — MOUNJARO 7.5 MG/0.5ML ~~LOC~~ SOAJ: 7.5000 mg | SUBCUTANEOUS | 3 refills | Status: DC

## 2022-02-21 ENCOUNTER — Other Ambulatory Visit: Payer: Self-pay | Admitting: Neurology

## 2022-02-21 DIAGNOSIS — F909 Attention-deficit hyperactivity disorder, unspecified type: Secondary | ICD-10-CM

## 2022-02-21 MED ORDER — ADDERALL XR 30 MG PO CP24: 30.0000 mg | ORAL_CAPSULE | Freq: Every day | ORAL | 0 refills | Status: DC

## 2022-04-15 ENCOUNTER — Encounter: Payer: Self-pay | Admitting: Neurology

## 2022-04-15 ENCOUNTER — Encounter: Payer: Self-pay | Admitting: Obstetrics and Gynecology

## 2022-04-15 DIAGNOSIS — F909 Attention-deficit hyperactivity disorder, unspecified type: Secondary | ICD-10-CM

## 2022-04-18 ENCOUNTER — Encounter: Payer: Self-pay | Admitting: Neurology

## 2022-04-18 MED ORDER — ADDERALL XR 30 MG PO CP24: 30.0000 mg | ORAL_CAPSULE | Freq: Every day | ORAL | 0 refills | Status: DC

## 2022-04-18 NOTE — Telephone Encounter (Signed)
Checked [place] registry, last filled adderall xr 30 mg on 02/23/2022 #30/30. Rx sent to Dr Jaynee Eagles.

## 2022-04-18 NOTE — Telephone Encounter (Signed)
Last AEX 12/03/2021-neg birads 1, scheduled for 12/08/2022. Last mammo seen 05/01/2020-neg birads 1

## 2022-04-19 MED ORDER — COMBIPATCH 0.05-0.25 MG/DAY TD PTTW: 1.0000 | MEDICATED_PATCH | TRANSDERMAL | 2 refills | Status: DC

## 2022-05-04 NOTE — Telephone Encounter (Signed)
Called patient and per DPR access note on file left detailed message in voice mail and read her Dr. Gentry Fitz reply on this My Chart message.

## 2022-07-14 ENCOUNTER — Other Ambulatory Visit: Payer: Self-pay | Admitting: *Deleted

## 2022-07-14 DIAGNOSIS — F909 Attention-deficit hyperactivity disorder, unspecified type: Secondary | ICD-10-CM

## 2022-07-14 MED ORDER — ADDERALL XR 30 MG PO CP24: 30.0000 mg | ORAL_CAPSULE | Freq: Every day | ORAL | 0 refills | Status: DC

## 2022-08-29 IMAGING — MR MR ABDOMEN WO/W CM
18 series · 48 of 48 positions shown · IV contrast (18ml multihance)
Comparison: None.

CLINICAL DATA: Bloating nausea stool color changes with abnormality
discovered on recent CT evaluation, need for follow-up.

EXAM:
MRI ABDOMEN WITHOUT AND WITH CONTRAST
TECHNIQUE: Multiplanar multisequence MR imaging of the abdomen was performed
both before and after the administration of intravenous contrast.
CONTRAST:  18mL MULTIHANCE GADOBENATE DIMEGLUMINE 529 MG/ML IV SOLN

[Series 3: T2 · coronal · 5.0mm · 1.56mm/px · 1 of 36 slices shown (1 of 3)]
[im 1/36]
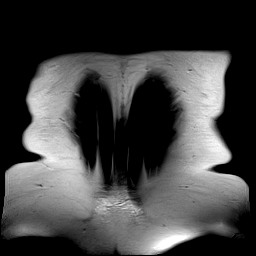

[Series 4: T1 · axial · 3.0mm · 1.19mm/px · z∈[-92,+145]mm · 6 of 160 slices shown]
[im 1/160]
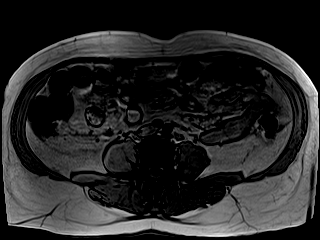
[im 32/160]
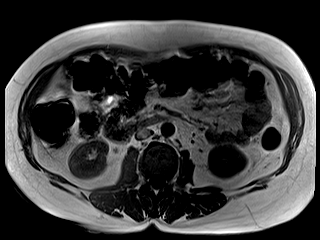
[im 64/160]
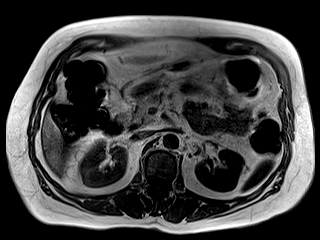
[im 96/160]
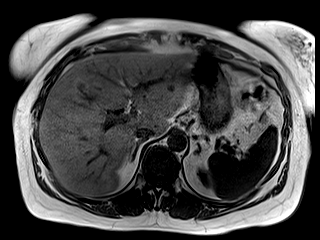
[im 128/160]
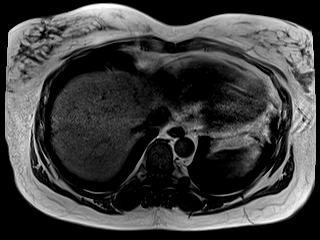
[im 160/160]
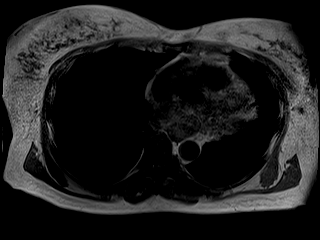

[Series 5: bSSFP · axial · 5.0mm · 1.25mm/px · 1 of 38 slices shown]
[im 1/38]
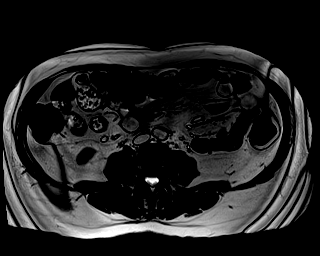

[Series 6: T2 · axial · 5.0mm · 1.48mm/px · 1 of 42 slices shown (2 of 3)]
[im 1/42]
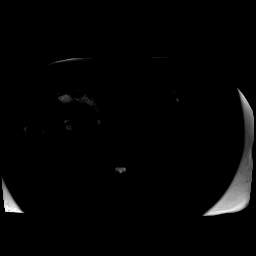

[Series 7: DWI · axial · 5.0mm · 1.42mm/px · z∈[-88,+158]mm · 4 of 126 slices shown (1 of 2)]
[im 1/126]
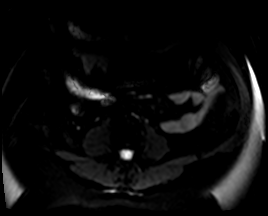
[im 42/126]
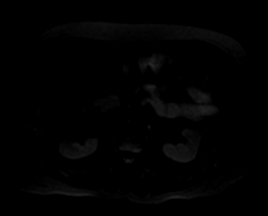
[im 84/126]
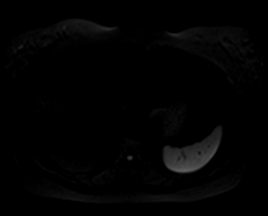
[im 126/126]
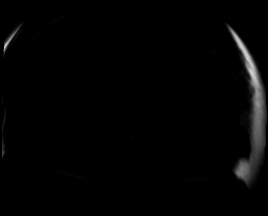

[Series 8: DWI · axial · 5.0mm · 1.42mm/px · 1 of 42 slices shown (2 of 2)]
[im 1/42]
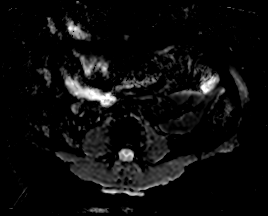

[Series 9: T2 · axial · 6.0mm · 1.19mm/px · 1 of 32 slices shown (3 of 3)]
[im 1/32]
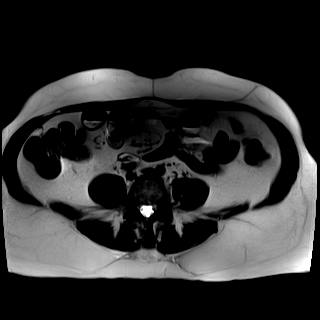

[Series 10: T1 dynamic · axial · non-contrast · 3.0mm · 1.25mm/px · z∈[-128,+109]mm · 3 of 80 slices shown]
[im 1/80]
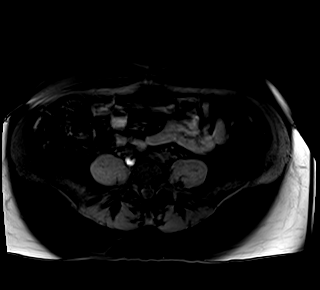
[im 40/80]
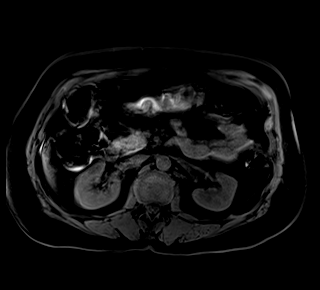
[im 80/80]
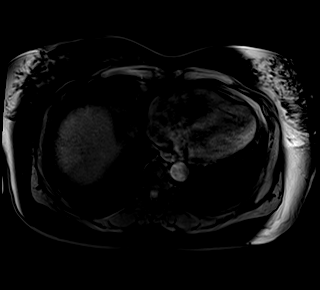

[Series 11: T1 dynamic post-contrast · axial · 3.0mm · 1.25mm/px · z∈[-128,+109]mm · 3 of 80 slices shown (1 of 10)]
[im 1/80]
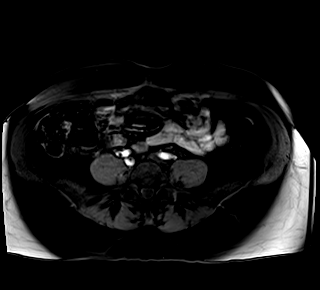
[im 40/80]
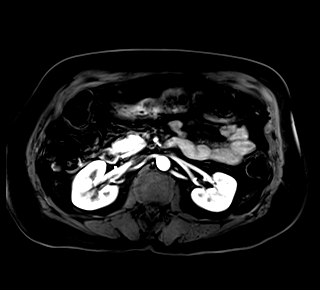
[im 80/80]
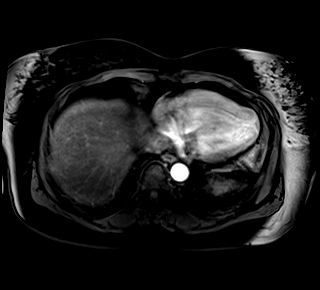

[Series 12: T1 dynamic post-contrast · axial · 3.0mm · 1.25mm/px · z∈[-128,+109]mm · 3 of 80 slices shown (2 of 10)]
[im 1/80]
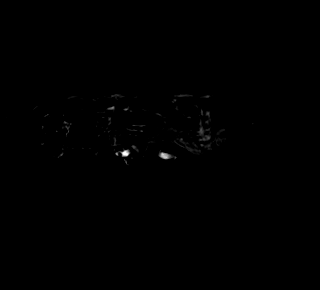
[im 40/80]
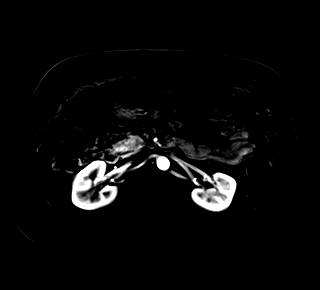
[im 80/80]
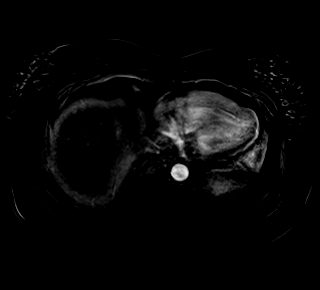

[Series 13: T1 dynamic post-contrast · axial · 3.0mm · 1.25mm/px · z∈[-128,+109]mm · 3 of 80 slices shown (3 of 10)]
[im 1/80]
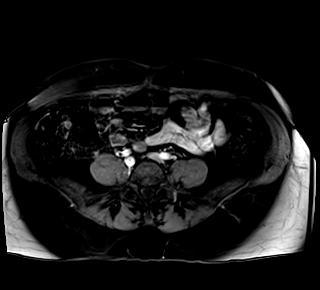
[im 40/80]
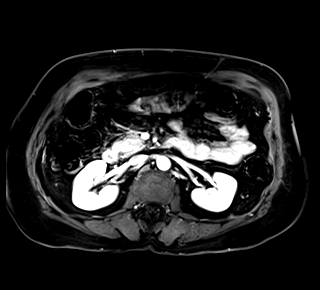
[im 80/80]
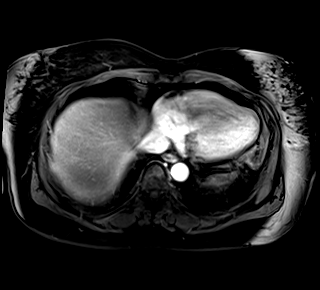

[Series 14: T1 dynamic post-contrast · axial · 3.0mm · 1.25mm/px · z∈[-128,+109]mm · 3 of 80 slices shown (4 of 10)]
[im 1/80]
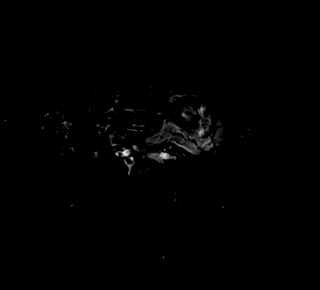
[im 40/80]
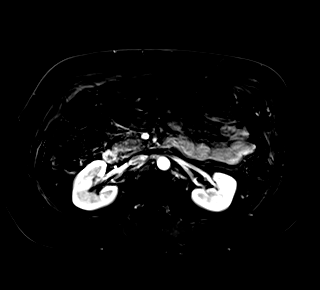
[im 80/80]
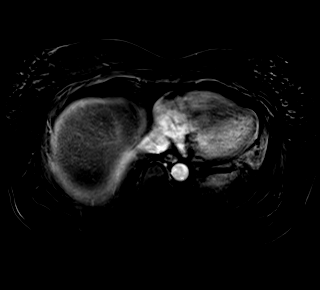

[Series 15: T1 dynamic post-contrast · axial · 3.0mm · 1.25mm/px · z∈[-128,+109]mm · 3 of 80 slices shown (5 of 10)]
[im 1/80]
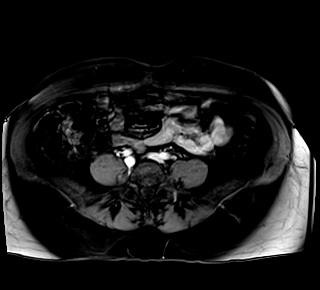
[im 40/80]
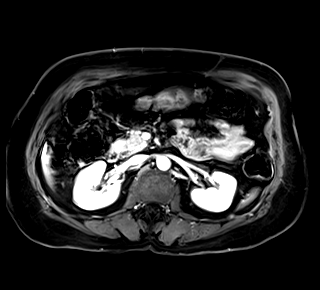
[im 80/80]
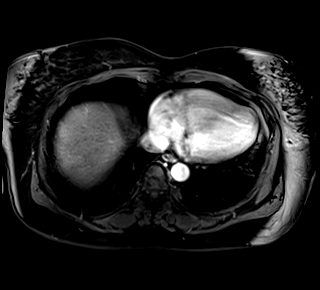

[Series 16: T1 dynamic post-contrast · axial · 3.0mm · 1.25mm/px · z∈[-128,+109]mm · 3 of 80 slices shown (6 of 10)]
[im 1/80]
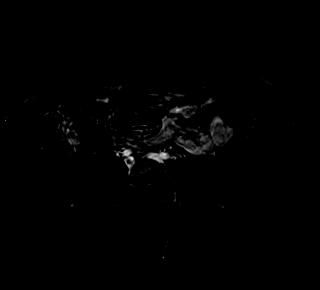
[im 40/80]
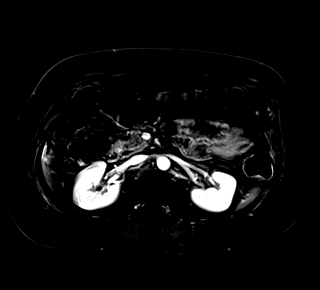
[im 80/80]
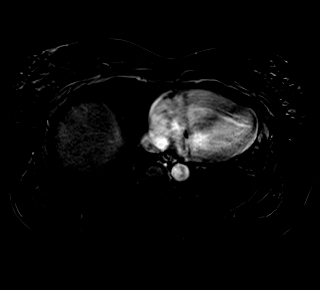

[Series 17: T1 dynamic post-contrast · coronal · 3.0mm · 1.25mm/px · 3 of 72 slices shown (7 of 10)]
[im 1/72]
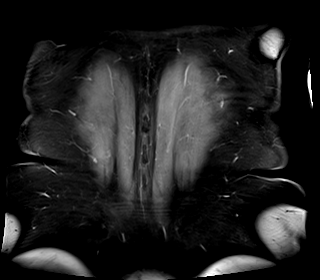
[im 36/72]
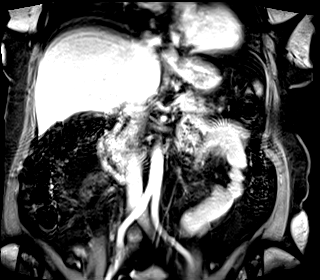
[im 72/72]
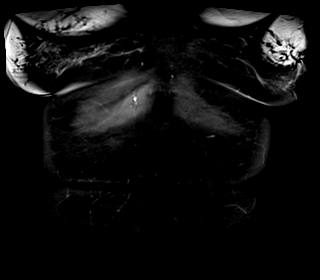

[Series 18: T1 dynamic post-contrast · axial · 3.0mm · 1.25mm/px · z∈[-128,+109]mm · 3 of 80 slices shown (8 of 10)]
[im 1/80]
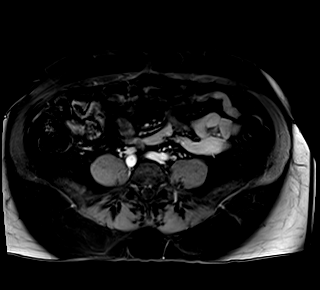
[im 40/80]
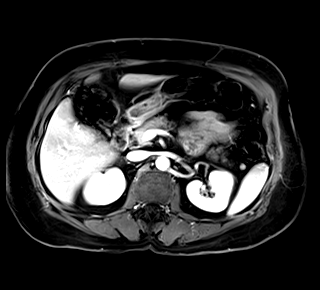
[im 80/80]
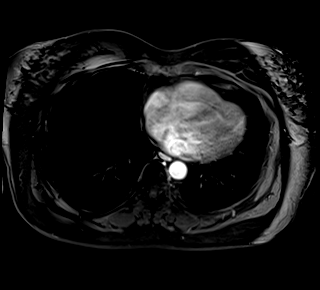

[Series 19: T1 dynamic post-contrast · axial · 3.0mm · 1.25mm/px · z∈[-128,+109]mm · 3 of 80 slices shown (9 of 10)]
[im 1/80]
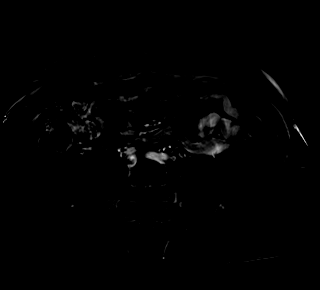
[im 40/80]
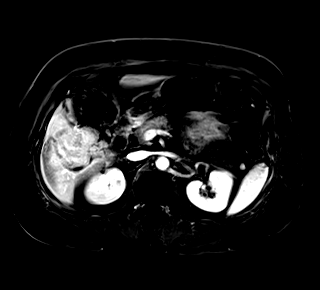
[im 80/80]
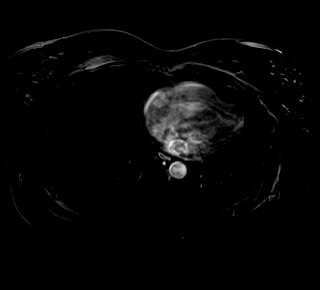

[Series 20: T1 dynamic post-contrast · axial · 3.0mm · 1.25mm/px · z∈[-128,+133]mm · 3 of 88 slices shown (10 of 10)]
[im 1/88]
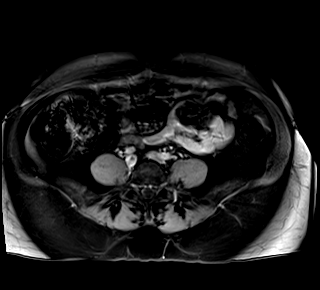
[im 44/88]
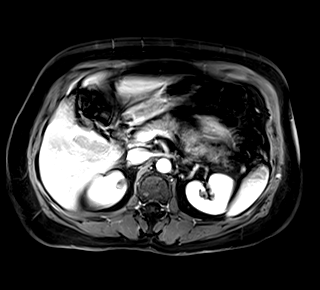
[im 88/88]
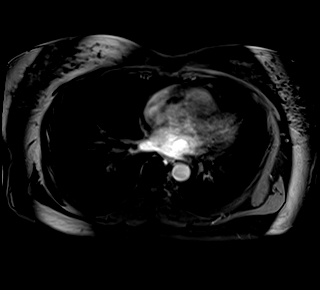

[48 of 48 positions shown; findings below may reference images not displayed]

FINDINGS: Lower chest: Lung bases are unremarkable on MRI, limited assessment.

Hepatobiliary: The dome of the RIGHT hemi liver is excluded from
view. The area of interest is included on current imaging,
well-circumscribed T2 bright area near the hepatic venous confluence
measuring 16 mm greatest axial dimension with perhaps subtle delayed
enhancement. Question of some peripheral and nodular enhancement.
Location makes assessment mildly difficult prior imaging suggests
this may have been present in Sunday October, 2019 though prior
noncontrast imaging shows limited assessment of this area.

Additional tiny T2 bright lesion in the lateral segment of the LEFT
hepatic lobe (image [DATE]) difficult to visualize on post-contrast
imaging until 3 minute delay where it follows blood pool. Lesions
appear to fade on diffusion-weighted imaging arguing for benign
etiology of the dominant lesion.

Fissural widening of hepatic contours. No substantial fat or iron
deposition. Post cholecystectomy without substantial biliary duct
dilation. No intrahepatic biliary duct distension.

Pancreas: Normal intrinsic T1 signal. No ductal dilation or sign of
inflammation. No focal lesion.

Spleen:  Normal.

Adrenals/Urinary Tract: Adrenal glands are normal. No suspicious
renal lesion or hydronephrosis. Renal cyst in the upper pole the
RIGHT kidney.

Stomach/Bowel: No acute gastrointestinal process to the extent
evaluated on abdominal MRI.

Vascular/Lymphatic: No pathologically enlarged lymph nodes
identified. No abdominal aortic aneurysm demonstrated.

Other:  No ascites

Musculoskeletal: No suspicious bone lesions identified.
IMPRESSION: Hepatic lesion in the dome of the liver near the hepatic venous
confluence remains indeterminate. Features suggest benign process
such as sclerosed hemangioma. Would suggest a follow-up study at a 3
month interval focused on the liver only and with extended
postcontrast delays [DATE] minutes.

Small LEFT hepatic hemangioma.

Widening of fissural contours, posterior RIGHT hepatic notching and
mild enlargement of the caudate all raise the question of developing
and or current evidence of early liver disease or even developing
cirrhosis. For this reason continued follow-up of the dominant
lesion is critical.

RIGHT renal cyst.

Post cholecystectomy.

## 2022-09-07 ENCOUNTER — Telehealth: Payer: Self-pay | Admitting: Neurology

## 2022-09-07 ENCOUNTER — Other Ambulatory Visit: Payer: Self-pay | Admitting: Neurology

## 2022-09-07 MED ORDER — METHYLPREDNISOLONE 4 MG PO TBPK: ORAL_TABLET | ORAL | 1 refills | Status: DC

## 2022-09-07 NOTE — Telephone Encounter (Signed)
MRI CERVICAL SPINE REVIEWED  STUDY DATE: 09/06/2022 PATIENT NAME: Martha Novas, MD DOB: 09/08/1965 MRN: [id number]  EXAM: MRI of the cervical spine   CLINICAL HISTORY: 57 year old woman with neck pain and right arm pain COMPARISON FILMS: None  TECHNIQUE: MRI of the cervical spine was obtained utilizing 3 mm sagittal slices from the posterior fossa down to the T3-4 level with T1, T2 and inversion recovery views. In addition 4 mm axial slices from C2-3 down to T1-2 level were included with T2 and gradient echo views. CONTRAST: None IMAGING SITE: [facility], [address] [place], [place] [address]  FINDINGS: :  On sagittal images, the spine is imaged from above the cervicomedullary junction to T2.   The cervicomedullary junction and the visible brain appears normal.  The spinal cord is of normal caliber and signal.   The vertebral bodies are normally aligned.   A hemangioma is noted towards the right within the C7 vertebral body.  Otherwise, the vertebral bodies have normal signal.    The discs and interspaces were further evaluated on axial views from C2 to T1 as follows:  C2-C3: This level is normal.  C3-C4 there is mild disc bulging.  No spinal stenosis or nerve root compression.  C4-C5: This level appears normal.  C5-C6: There is a right paramedian disc protrusion causing mild spinal stenosis and moderate right foraminal narrowing encroaching upon the right C6 nerve root.   C6-C7: There is right paramedian disc protrusion causing mild spinal stenosis and mild to moderate foraminal narrowing without nerve root compression.  C7-T1: This level is normal.   IMPRESSION: This MRI of the cervical spine without contrast shows the following: At C5-C6 there is a right paramedian disc protrusion causing mild spinal stenosis and moderate right foraminal narrowing which could affect the right C6 nerve root. At C6-C7, there is right paramedian disc protrusion causing mild  spinal stenosis and mild to moderate right foraminal narrowing.  There does not appear to be nerve root compression. The spinal cord appears normal.    INTERPRETING PHYSICIAN:  Zarion Oliff A. Epimenio Foot, MD, PhD, FAAN Certified in  Neuroimaging by [employer]

## 2022-09-12 ENCOUNTER — Other Ambulatory Visit: Payer: Self-pay | Admitting: Neurology

## 2022-09-12 MED ORDER — METHYLPREDNISOLONE 4 MG PO TBPK: ORAL_TABLET | ORAL | 3 refills | Status: DC

## 2022-09-16 ENCOUNTER — Ambulatory Visit (HOSPITAL_BASED_OUTPATIENT_CLINIC_OR_DEPARTMENT_OTHER)
Admission: RE | Admit: 2022-09-16 | Discharge: 2022-09-16 | Disposition: A | Discharge status: Home / Self Care | Payer: PRIVATE HEALTH INSURANCE | Source: Ambulatory Visit | Attending: Obstetrics and Gynecology | Admitting: Obstetrics and Gynecology

## 2022-09-16 ENCOUNTER — Other Ambulatory Visit (HOSPITAL_BASED_OUTPATIENT_CLINIC_OR_DEPARTMENT_OTHER): Payer: Self-pay

## 2022-09-16 DIAGNOSIS — Z1231 Encounter for screening mammogram for malignant neoplasm of breast: Secondary | ICD-10-CM

## 2022-10-03 ENCOUNTER — Other Ambulatory Visit: Payer: Self-pay | Admitting: Neurology

## 2022-10-03 DIAGNOSIS — F909 Attention-deficit hyperactivity disorder, unspecified type: Secondary | ICD-10-CM

## 2022-10-03 MED ORDER — ADDERALL XR 30 MG PO CP24
30.0000 mg | ORAL_CAPSULE | Freq: Every day | ORAL | 0 refills | Status: DC
Start: 2022-10-03 — End: 2022-11-22

## 2022-11-22 ENCOUNTER — Other Ambulatory Visit: Payer: Self-pay | Admitting: Neurology

## 2022-11-22 DIAGNOSIS — E1169 Type 2 diabetes mellitus with other specified complication: Secondary | ICD-10-CM

## 2022-11-22 DIAGNOSIS — F909 Attention-deficit hyperactivity disorder, unspecified type: Secondary | ICD-10-CM

## 2022-11-22 DIAGNOSIS — E1149 Type 2 diabetes mellitus with other diabetic neurological complication: Secondary | ICD-10-CM

## 2022-11-22 DIAGNOSIS — F900 Attention-deficit hyperactivity disorder, predominantly inattentive type: Secondary | ICD-10-CM

## 2022-11-22 MED ORDER — MOUNJARO 10 MG/0.5ML ~~LOC~~ SOAJ
10.0000 mg | SUBCUTANEOUS | 3 refills | Status: DC
Start: 2022-11-22 — End: 2023-01-10

## 2022-11-22 MED ORDER — ADDERALL XR 30 MG PO CP24
30.0000 mg | ORAL_CAPSULE | Freq: Every day | ORAL | 0 refills | Status: DC
Start: 2022-11-22 — End: 2023-01-16

## 2022-12-08 ENCOUNTER — Ambulatory Visit: Payer: PRIVATE HEALTH INSURANCE | Admitting: Obstetrics and Gynecology

## 2023-01-10 ENCOUNTER — Other Ambulatory Visit: Payer: Self-pay | Admitting: Neurology

## 2023-01-10 DIAGNOSIS — L219 Seborrheic dermatitis, unspecified: Secondary | ICD-10-CM

## 2023-01-10 MED ORDER — KETOCONAZOLE 2 % EX SHAM
1.0000 | MEDICATED_SHAMPOO | CUTANEOUS | 4 refills | Status: AC
Start: 2023-01-12 — End: ?

## 2023-01-10 MED ORDER — MOUNJARO 7.5 MG/0.5ML ~~LOC~~ SOAJ: 7.5000 mg | SUBCUTANEOUS | 4 refills | Status: AC

## 2023-01-16 ENCOUNTER — Other Ambulatory Visit: Payer: Self-pay | Admitting: Neurology

## 2023-01-16 DIAGNOSIS — F900 Attention-deficit hyperactivity disorder, predominantly inattentive type: Secondary | ICD-10-CM

## 2023-01-16 DIAGNOSIS — F909 Attention-deficit hyperactivity disorder, unspecified type: Secondary | ICD-10-CM

## 2023-01-16 MED ORDER — ADDERALL XR 30 MG PO CP24
30.0000 mg | ORAL_CAPSULE | Freq: Every day | ORAL | 0 refills | Status: DC
Start: 2023-01-16 — End: 2023-02-20

## 2023-02-02 ENCOUNTER — Telehealth: Payer: Self-pay

## 2023-02-02 ENCOUNTER — Telehealth: Payer: Self-pay | Admitting: Neurology

## 2023-02-02 NOTE — Telephone Encounter (Signed)
*  [facility]  Pharmacy Patient Advocate Encounter   Received notification from CoverMyMeds that prior authorization for Mounjaro 7.5MG /0.5ML auto-injectors  is required/requested.   Insurance verification completed.   The patient is insured through [facility] .   Per test claim: PA required; PA started via CoverMyMeds. KEY [id number] . Waiting for clinical questions to populate.

## 2023-02-02 NOTE — Telephone Encounter (Signed)
PA approved for the patient until 02/02/2024 through opt[employer] rx  Request Reference Number: [id number]. MOUNJARO INJ 7.5/0.5 is approved through 02/02/2024.

## 2023-02-02 NOTE — Telephone Encounter (Signed)
PA completed on CMM/optumrx ZOX:[id number] Awaiting determination

## 2023-02-20 ENCOUNTER — Other Ambulatory Visit: Payer: Self-pay | Admitting: *Deleted

## 2023-02-20 DIAGNOSIS — F909 Attention-deficit hyperactivity disorder, unspecified type: Secondary | ICD-10-CM

## 2023-02-20 DIAGNOSIS — F900 Attention-deficit hyperactivity disorder, predominantly inattentive type: Secondary | ICD-10-CM

## 2023-02-20 MED ORDER — ADDERALL XR 30 MG PO CP24
30.0000 mg | ORAL_CAPSULE | Freq: Every day | ORAL | 0 refills | Status: DC
Start: 2023-02-20 — End: 2023-03-30

## 2023-02-20 NOTE — Telephone Encounter (Signed)
Pt requests refill of Adderall

## 2023-03-12 ENCOUNTER — Telehealth: Payer: Self-pay | Admitting: Cardiology

## 2023-03-12 DIAGNOSIS — R002 Palpitations: Secondary | ICD-10-CM

## 2023-03-13 ENCOUNTER — Ambulatory Visit: Payer: PRIVATE HEALTH INSURANCE | Attending: Cardiology

## 2023-03-13 DIAGNOSIS — R002 Palpitations: Secondary | ICD-10-CM

## 2023-03-13 NOTE — Progress Notes (Unsigned)
ZIO serial # [id number] from office inventory applied.  Dr. Jacinto Halim to read.

## 2023-03-13 NOTE — Telephone Encounter (Signed)
Monitor applied in office today

## 2023-03-29 ENCOUNTER — Other Ambulatory Visit: Payer: Self-pay | Admitting: Neurology

## 2023-03-29 DIAGNOSIS — F909 Attention-deficit hyperactivity disorder, unspecified type: Secondary | ICD-10-CM

## 2023-03-29 DIAGNOSIS — F900 Attention-deficit hyperactivity disorder, predominantly inattentive type: Secondary | ICD-10-CM

## 2023-03-30 ENCOUNTER — Other Ambulatory Visit: Payer: Self-pay | Admitting: Neurology

## 2023-03-30 DIAGNOSIS — F909 Attention-deficit hyperactivity disorder, unspecified type: Secondary | ICD-10-CM

## 2023-03-30 DIAGNOSIS — F900 Attention-deficit hyperactivity disorder, predominantly inattentive type: Secondary | ICD-10-CM

## 2023-03-30 MED ORDER — ADDERALL XR 30 MG PO CP24
30.0000 mg | ORAL_CAPSULE | Freq: Every day | ORAL | 0 refills | Status: DC
Start: 2023-03-30 — End: 2023-04-03

## 2023-03-30 MED ORDER — ADDERALL XR 30 MG PO CP24
30.0000 mg | ORAL_CAPSULE | Freq: Every day | ORAL | 0 refills | Status: DC
Start: 2023-03-30 — End: 2023-04-04

## 2023-04-02 NOTE — Progress Notes (Signed)
 Cardiology Office Note:  .   Date:  04/03/2023  ID:  Martha Gores, MD, DOB May 13, 1965, MRN [id number] PCP: Shayne Anes, MD  [facility] Providers Cardiologist:  Gordy Bergamo, MD   History of Present Illness: .   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  Discussed the use of AI scribe software for clinical note transcription with the patient, who gave verbal consent to proceed.  History of Present Illness   Martha Vincent, a neurologist and sleep doctor, presents with high blood pressure, vertigo, tinnitus, and lightheadedness. She reports that her blood pressure has been consistently high recently, despite being on verapamil . She also mentions experiencing palpitations and a general feeling of unwellness. Martha Vincent has been under significant stress due to work and personal issues, including caring for her elderly parents. She has lost weight due to often skipping meals and reports feeling exhausted by late afternoon. Martha Vincent denies having sleep apnea but has not had a recent sleep study. She has a family history of heart disease and Parkinson's.      Labs   External Labs:  Labs 12/02/2022:  Serum glucose 87 mg, BUN 18, creatinine 0.8, EGFR 73 mL, potassium 4.4.  Hb 13.2/HCT 40.4, platelets 265, normal indicis.  Total cholesterol 225, triglycerides 66, HDL 61, LDL 151.  Non-HDL cholesterol 164.  A1c 5.0%.  TSH normal at 1.77.  Vitamin D49.9.  Review of Systems  Constitutional: Positive for malaise/fatigue.  Cardiovascular:  Positive for palpitations. Negative for chest pain, dyspnea on exertion and leg swelling.    Physical Exam:   VS:  BP (!) 150/100 (BP Location: Left Arm, Patient Position: Sitting, Cuff Size: Large)   Pulse 73   Resp 16   Ht 5' 9 (1.753 m)   Wt 194 lb 12.8 oz (88.4 kg)   SpO2 95%   BMI 28.77 kg/m    Wt Readings from Last 3 Encounters:  04/03/23 194  lb 12.8 oz (88.4 kg)  12/03/21 211 lb (95.7 kg)  11/16/20 221 lb (100.2 kg)     Physical Exam Neck:     Vascular: No carotid bruit or JVD.  Cardiovascular:     Rate and Rhythm: Normal rate and regular rhythm.     Pulses: Intact distal pulses.     Heart sounds: Normal heart sounds. No murmur heard.    No gallop.  Pulmonary:     Effort: Pulmonary effort is normal.     Breath sounds: Normal breath sounds.  Abdominal:     General: Bowel sounds are normal.     Palpations: Abdomen is soft.  Musculoskeletal:     Right lower leg: No edema.     Left lower leg: No edema.    Studies Reviewed: .    Echo- 04/06/2018 1. Left ventricle cavity is normal in size. Normal global wall motion. Calculated EF 64%. 2. Left atrial cavity is borderline dilated. 3. Mild (Grade I) mitral regurgitation. 4. Mild tricuspid regurgitation. No evidence of pulmonary hypertension. 5. Minimal echo free space is seen in pericardium, may be due to tiny pericardial effusion or epicardial fat pad.   Treadmill exercise stress test 03/30/2018: Indication: Dyspnea. The patient exercised on Bruce protocol for   6:45 min. Patient achieved  8.18 METS and reached HR  160 bpm, which is   95% of maximum age-predicted HR.  Stress test terminated due to dyspnea.  Resting EKG demonstrates Normal sinus rhythm. ST  Changes: With peak exercise there was no ST-T changes of ischemia.  Arrhythmias: none. BP Response to Exercise: Resting hypertension- appropriate response. HR Response to Exercise: Exaggerated HR response suggests decreased aerobic tolerence.  Exercise capacity was below average for age.  Overall Impression: Normal stress test. Recommendations: Continue primary/secondary prevention.  Coronary calcium score 0/23/2021 Total Agatston coronary calcium score 0. MESA database percentile 0-1. Visualized ascending and descending aorta normal. Extracardiac abnormalities: None.   Extended out patient EKG monitoring 7 days  starting 03/13/2023: Predominant rhythm was normal sinus rhythm.  Minimum heart rate 54 bpm at 10:30 AM and maximum heart rate of 140 bpm at 5:12 PM.  Average heartbeat 77 bpm.   There were 7 SVT episodes, longest 6 beats, EKG reveals brief atrial tachycardia.  The rare isolated PACs, rare atrial couplets and triplets. Patient triggered events revealed normal sinus rhythm/sinus tachycardia.    There was 1 ventricular tachycardia episode lasting 19 beats with a maximum ventricular rate of 200 bpm for 6.5 seconds, patient asymptomatic.  There were rare PVCs.     EKG:    EKG Interpretation Date/Time:  Monday April 03 2023 12:06:07 EST Ventricular Rate:  74 PR Interval:  168 QRS Duration:  82 QT Interval:  370 QTC Calculation: 410 R Axis:   2  Text Interpretation: EKG 04/03/2023: Normal sinus rhythm at rate of 74 bpm, left atrial enlargement, poor R progression, probably normal variant cannot exclude anteroseptal infarct old. Confirmed by Abrey Bradway, Jagadeesh ([id number]) on 04/03/2023 12:32:11 PM    Medications and allergies    Allergies  Allergen Reactions   Adhesive [Tape] Other (See Comments)    blisters   Bactrim  [Sulfamethoxazole -Trimethoprim ] Other (See Comments)    neck stiffness   Coconut Flavoring Agent (Non-Screening) Nausea And Vomiting    Fresh coconut   Erythromycin Other (See Comments)    Other Reaction: Topical Erythromycin Rash   Lisinopril Cough   Sulfa  Antibiotics Other (See Comments)    neck stiffness     Current Outpatient Medications:    ADDERALL  XR 30 MG 24 hr capsule, Take 1 capsule (30 mg total) by mouth daily., Disp: 30 capsule, Rfl: 0   Cholecalciferol (VITAMIN D PO), Take by mouth., Disp: , Rfl:    ibuprofen (ADVIL,MOTRIN) 200 MG tablet, Take 200 mg by mouth every 6 (six) hours as needed for headache or mild pain. Reported on 06/12/2015, Disp: , Rfl:    ketoconazole  (NIZORAL ) 2 % shampoo, Apply 1 Application topically 2 (two) times a week., Disp: 360 mL,  Rfl: 4   olmesartan  (BENICAR ) 20 MG tablet, Take 1 tablet (20 mg total) by mouth every evening., Disp: 30 tablet, Rfl: 2   tirzepatide  (MOUNJARO ) 7.5 MG/0.5ML Pen, Inject 7.5 mg into the skin once a week., Disp: 6 mL, Rfl: 4   verapamil  (VERELAN  PM) 180 MG 24 hr capsule, Take 1 capsule (180 mg total) by mouth 2 (two) times a day., Disp: 180 capsule, Rfl: 3   NONFORMULARY OR COMPOUNDED ITEM, Testosterone cream 1%  30 gram tube S:  Apply 0.5 grams to lower abdomen daily. (Patient not taking: Reported on 04/03/2023), Disp: 30 each, Rfl: 0   ASSESSMENT AND PLAN: .      ICD-10-CM   1. Palpitations  R00.2 EKG 12-Lead    CBC    Basic Metabolic Panel (BMET)    EXERCISE TOLERANCE TEST (ETT)    2. NSVT (nonsustained ventricular tachycardia) (HCC)  I47.29 MR CARDIAC MORPHOLOGY W WO CONTRAST    CBC  Basic Metabolic Panel (BMET)    EXERCISE TOLERANCE TEST (ETT)    3. Primary hypertension  I10 olmesartan  (BENICAR ) 20 MG tablet    CBC    Basic Metabolic Panel (BMET)    EXERCISE TOLERANCE TEST (ETT)    Assessment and Plan    Hypertension Elevated blood pressure readings, currently on Verapamil . Patient also taking Adderall . Discussed potential link between hypertension and symptoms of vertigo and tinnitus. -Start Olmesartan  20mg  at night for better blood pressure control.  Ventricular Tachycardia Detected on event monitor, lasting for 6.5 seconds at 8:26 AM. No clear correlation with symptoms. Discussed potential structural heart disease. -Order Cardiac MRI to rule out structural heart disease. -Plan for treadmill stress test following MRI results.  Work-related Stress Patient reports significant stress related to work, potentially contributing to hypertension. -Consider discussing stress management strategies or referral for counseling.  Hyperlipidemia Elevated LDL, currently not on any lipid-lowering medication. Patient did not fill prescription for Zetia previously. -Consider discussing  the importance of lipid management in future visits.  Follow-up Plan to review results of cardiac MRI and treadmill stress test. -Schedule follow-up appointment after completion of these tests.        Informed Consent   Shared Decision Making/Informed Consent The risks [chest pain, shortness of breath, cardiac arrhythmias, dizziness, blood pressure fluctuations, myocardial infarction, stroke/transient ischemic attack, and life-threatening complications (estimated to be 1 in 10,000)], benefits (risk stratification, diagnosing coronary artery disease, treatment guidance) and alternatives of an exercise tolerance test were discussed in detail with Ms. Quilling and she agrees to proceed.      Signed,  Gordy Bergamo, MD, [facility] 04/03/2023, 9:05 PM [facility] [address] [place], [place] [address] Phone: [phone]. Fax:  [phone]

## 2023-04-03 ENCOUNTER — Encounter: Payer: Self-pay | Admitting: Cardiology

## 2023-04-03 ENCOUNTER — Ambulatory Visit: Payer: PRIVATE HEALTH INSURANCE | Attending: Cardiology | Admitting: Cardiology

## 2023-04-03 VITALS — BP 150/100 | HR 73 | Resp 16 | Ht 69.0 in | Wt 194.8 lb

## 2023-04-03 DIAGNOSIS — I4729 Other ventricular tachycardia: Secondary | ICD-10-CM | POA: Diagnosis not present

## 2023-04-03 DIAGNOSIS — I10 Essential (primary) hypertension: Secondary | ICD-10-CM | POA: Diagnosis not present

## 2023-04-03 DIAGNOSIS — R002 Palpitations: Secondary | ICD-10-CM

## 2023-04-03 MED ORDER — OLMESARTAN MEDOXOMIL 20 MG PO TABS
20.0000 mg | ORAL_TABLET | Freq: Every evening | ORAL | 2 refills | Status: DC
Start: 2023-04-03 — End: 2023-08-02

## 2023-04-03 NOTE — Patient Instructions (Addendum)
 Medication Instructions:  Your physician recommends that you continue on your current medications as directed. Please refer to the Current Medication list given to you today.  *If you need a refill on your cardiac medications before your next appointment, please call your pharmacy*   Lab Work: Have Lab work done prior to MRI  (CBC and BMP).  You can have done at any [facility] location. There is an office on the first floor of our building If you have labs (blood work) drawn today and your tests are completely normal, you will receive your results only by: MyChart Message (if you have MyChart) OR A paper copy in the mail If you have any lab test that is abnormal or we need to change your treatment, we will call you to review the results.   Testing/Procedures: Your physician has requested that you have a cardiac MRI. Cardiac MRI uses a computer to create images of your heart as its beating, producing both still and moving pictures of your heart and major blood vessels. For further information please visit [email or web address]. Please follow the instruction sheet given to you today for more information.   Your physician has requested that you have an exercise tolerance test. For further information please visit [email or web address] Please also follow instruction sheet, as given. To be done after MRI Follow-Up: At [facility], you and your health needs are our priority.  As part of our continuing mission to provide you with exceptional heart care, we have created designated Provider Care Teams.  These Care Teams include your primary Cardiologist (physician) and Advanced Practice Providers (APPs -  Physician Assistants and Nurse Practitioners) who all work together to provide you with the care you need, when you need it.  We recommend signing up for the patient portal called MyChart.  Sign up information is provided on this After Visit Summary.  MyChart is used to connect with patients for  Virtual Visits (Telemedicine).  Patients are able to view lab/test results, encounter notes, upcoming appointments, etc.  Non-urgent messages can be sent to your provider as well.   To learn more about what you can do with MyChart, go to [email or web address].    Your next appointment:   After testing completed  Provider:   Dr Ladona    Other Instructions    You are scheduled for Cardiac MRI at the location below.  Please arrive for your appointment at ______________ . ?  [facility] [address] [place], [place] [address] [phone] Please take advantage of the free valet parking available at the [facility] [employer] (Entrance C).  Proceed to the [facility] Radiology Department (First Floor) for check-in.   OR   [facility] [address] [place], [place] [address] [phone] Please go to the [facility] and check-in with the desk attendant.   Magnetic resonance imaging (MRI) is a painless test that produces images of the inside of the body without using Xrays.  During an MRI, strong magnets and radio waves work together in a data processing manager to form detailed images.   MRI images may provide more details about a medical condition than X-rays, CT scans, and ultrasounds can provide.  You may be given earphones to listen for instructions.  You may eat a light breakfast and take medications as ordered with the exception of furosemide, hydrochlorothiazide, chlorthalidone or spironolactone  (or any other fluid pill). If you are undergoing a stress MRI, please avoid stimulants for 12 hr prior  to test. (I.e. Caffeine, nicotine, chocolate, or antihistamine medications)  An IV will be inserted into one of your veins. Contrast material will be injected into your IV. It will leave your body through your urine within a day. You may be told to drink plenty of fluids to help flush the contrast material out of your system.  You will  be asked to remove all metal, including: Watch, jewelry, and other metal objects including hearing aids, hair pieces and dentures. Also wearable glucose monitoring systems (ie. Freestyle Libre and Omnipods) (Braces and fillings normally are not a problem.)   TEST WILL TAKE APPROXIMATELY 1 HOUR  PLEASE NOTIFY SCHEDULING AT LEAST 24 HOURS IN ADVANCE IF YOU ARE UNABLE TO KEEP YOUR APPOINTMENT. [phone]  For more information and frequently asked questions, please visit our website : [email or web address]  Please call the Cardiac Imaging Nurse Navigators with any questions/concerns. [phone] Office

## 2023-04-04 ENCOUNTER — Other Ambulatory Visit: Payer: Self-pay | Admitting: Neurology

## 2023-04-04 ENCOUNTER — Encounter: Payer: Self-pay | Admitting: Cardiology

## 2023-04-04 DIAGNOSIS — F909 Attention-deficit hyperactivity disorder, unspecified type: Secondary | ICD-10-CM

## 2023-04-04 DIAGNOSIS — F900 Attention-deficit hyperactivity disorder, predominantly inattentive type: Secondary | ICD-10-CM

## 2023-04-04 MED ORDER — ADDERALL XR 30 MG PO CP24
30.0000 mg | ORAL_CAPSULE | Freq: Every day | ORAL | 0 refills | Status: DC
Start: 2023-04-04 — End: 2023-06-16

## 2023-04-05 ENCOUNTER — Other Ambulatory Visit: Payer: Self-pay | Admitting: Neurology

## 2023-04-05 DIAGNOSIS — I10 Essential (primary) hypertension: Secondary | ICD-10-CM

## 2023-04-12 ENCOUNTER — Encounter: Payer: Self-pay | Admitting: Cardiology

## 2023-04-12 ENCOUNTER — Ambulatory Visit: Payer: PRIVATE HEALTH INSURANCE | Admitting: Neurology

## 2023-04-12 DIAGNOSIS — I10 Essential (primary) hypertension: Secondary | ICD-10-CM

## 2023-04-12 DIAGNOSIS — G4733 Obstructive sleep apnea (adult) (pediatric): Secondary | ICD-10-CM | POA: Diagnosis not present

## 2023-04-12 DIAGNOSIS — R002 Palpitations: Secondary | ICD-10-CM

## 2023-04-12 DIAGNOSIS — I4729 Other ventricular tachycardia: Secondary | ICD-10-CM

## 2023-04-17 NOTE — Progress Notes (Signed)
   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 Sater, MD, PhD     CLINICAL INFORMATION: 58 year old woman with history of mild OSA and short runs of SVT on cardiac monitoring  FINDINGS:  Total Record Time: 7 hours 57 minutes Total Sleep Time:  6 hours 48 minutes  Percent REM:   21.9   Calculated pAHI 3%:  6.1/h       supine pAHI 3%: 23/h    REM pAHI:      9.6/h      NREM pAHI: 5.1/h    Pulse Mean:    67 bpm  pulse Range (55-96 bpm) NSR    Oxygen Sat% Mean: 96%   O2Sat <88%: 0 minutes    IMPRESSION:  Mild OSA with a pAHI 3% of 6.1/h.  OSA was moderate during supine sleep with a supine pAHI 3% of 23/h No significant nocturnal hypoxemia was noted.   RECOMMENDATION: Consider positional therapy or an oral appliance for OSA that occurred predominantly during supine sleep   INTERPRETING PHYSICIAN:   Richard A. Epimenio Foot, MD, PhD, [facility] Certified in Neurology, Clinical Neurophysiology, Sleep Medicine, Pain Medicine and Neuroimaging  [facility] [address] [place], [place] [address] [phone]

## 2023-04-25 ENCOUNTER — Other Ambulatory Visit: Payer: Self-pay | Admitting: Neurology

## 2023-04-25 ENCOUNTER — Other Ambulatory Visit: Payer: Self-pay

## 2023-04-25 DIAGNOSIS — R42 Dizziness and giddiness: Secondary | ICD-10-CM

## 2023-04-25 DIAGNOSIS — R000 Tachycardia, unspecified: Secondary | ICD-10-CM

## 2023-04-25 DIAGNOSIS — R5383 Other fatigue: Secondary | ICD-10-CM

## 2023-04-26 LAB — COMPREHENSIVE METABOLIC PANEL
ALT: 11 [IU]/L (ref 0–32)
AST: 13 [IU]/L (ref 0–40)
Albumin: 4.7 g/dL (ref 3.8–4.9)
Alkaline Phosphatase: 66 [IU]/L (ref 44–121)
BUN/Creatinine Ratio: 23 (ref 9–23)
BUN: 23 mg/dL (ref 6–24)
Bilirubin Total: 0.4 mg/dL (ref 0.0–1.2)
CO2: 17 mmol/L — ABNORMAL LOW (ref 20–29)
Calcium: 10.3 mg/dL — ABNORMAL HIGH (ref 8.7–10.2)
Chloride: 104 mmol/L (ref 96–106)
Creatinine, Ser: 0.98 mg/dL (ref 0.57–1.00)
Globulin, Total: 2.3 g/dL (ref 1.5–4.5)
Glucose: 105 mg/dL — ABNORMAL HIGH (ref 70–99)
Potassium: 4.8 mmol/L (ref 3.5–5.2)
Sodium: 136 mmol/L (ref 134–144)
Total Protein: 7 g/dL (ref 6.0–8.5)
eGFR: 67 mL/min/{1.73_m2} (ref >59)

## 2023-04-26 LAB — CBC WITH DIFFERENTIAL/PLATELET
Basophils Absolute: 0 10*3/uL (ref 0.0–0.2)
Basos: 0 %
EOS (ABSOLUTE): 0.1 10*3/uL (ref 0.0–0.4)
Eos: 1 %
Hematocrit: 43.9 % (ref 34.0–46.6)
Hemoglobin: 14.3 g/dL (ref 11.1–15.9)
Immature Grans (Abs): 0 10*3/uL (ref 0.0–0.1)
Immature Granulocytes: 0 %
Lymphocytes Absolute: 3.2 10*3/uL — ABNORMAL HIGH (ref 0.7–3.1)
Lymphs: 36 %
MCH: 30.8 pg (ref 26.6–33.0)
MCHC: 32.6 g/dL (ref 31.5–35.7)
MCV: 95 fL (ref 79–97)
Monocytes Absolute: 0.7 10*3/uL (ref 0.1–0.9)
Monocytes: 8 %
Neutrophils Absolute: 5 10*3/uL (ref 1.4–7.0)
Neutrophils: 55 %
Platelets: 338 10*3/uL (ref 150–450)
RBC: 4.64 x10E6/uL (ref 3.77–5.28)
RDW: 13 % (ref 11.7–15.4)
WBC: 9 10*3/uL (ref 3.4–10.8)

## 2023-04-26 LAB — TSH RFX ON ABNORMAL TO FREE T4: TSH: 3.69 u[IU]/mL (ref 0.450–4.500)

## 2023-05-03 NOTE — Telephone Encounter (Signed)
[employer] paperwork for MRI done today. Patient has insurance from [place] and prefers to have Cardiac MR in their facility on [address]. Let me know if I need to do anything

## 2023-05-05 NOTE — Telephone Encounter (Signed)
I checked with [facility] and cardiac MRI is only done at [facility] in [place].  I spoke with Central Scheduling  [phone] and they need order faxed.  Once received they will contact patient.   Fax number [phone].  Will fax order

## 2023-05-08 ENCOUNTER — Ambulatory Visit (HOSPITAL_COMMUNITY): Payer: PRIVATE HEALTH INSURANCE

## 2023-06-16 ENCOUNTER — Other Ambulatory Visit: Payer: Self-pay | Admitting: Neurology

## 2023-06-16 DIAGNOSIS — F900 Attention-deficit hyperactivity disorder, predominantly inattentive type: Secondary | ICD-10-CM

## 2023-06-16 DIAGNOSIS — F909 Attention-deficit hyperactivity disorder, unspecified type: Secondary | ICD-10-CM

## 2023-06-16 MED ORDER — ADDERALL XR 30 MG PO CP24
30.0000 mg | ORAL_CAPSULE | Freq: Every day | ORAL | 0 refills | Status: DC
Start: 2023-06-16 — End: 2023-07-28

## 2023-07-04 ENCOUNTER — Telehealth: Payer: Self-pay | Admitting: Cardiology

## 2023-07-04 NOTE — Telephone Encounter (Signed)
 No velocity flow map needed

## 2023-07-04 NOTE — Telephone Encounter (Signed)
 Message sent to [facility] for advice.

## 2023-07-04 NOTE — Telephone Encounter (Signed)
 Edwina Gram with [facility] called in stating they received order for MRI Morphology with or w/o contrast from Dr. Berry Bristol, however financial dept and her insurance for the authorization is wanting to know if they need velocity flow map with this as well. Please advise.

## 2023-07-05 NOTE — Telephone Encounter (Signed)
 Returned call to [facility] spoke to [employer] given.

## 2023-07-12 ENCOUNTER — Encounter: Payer: Self-pay | Admitting: Cardiology

## 2023-07-12 DIAGNOSIS — R002 Palpitations: Secondary | ICD-10-CM

## 2023-07-12 DIAGNOSIS — I4729 Other ventricular tachycardia: Secondary | ICD-10-CM

## 2023-07-25 ENCOUNTER — Encounter (HOSPITAL_COMMUNITY): Payer: Self-pay

## 2023-07-27 ENCOUNTER — Other Ambulatory Visit: Payer: Self-pay | Admitting: Neurology

## 2023-07-27 DIAGNOSIS — F900 Attention-deficit hyperactivity disorder, predominantly inattentive type: Secondary | ICD-10-CM

## 2023-07-27 DIAGNOSIS — F909 Attention-deficit hyperactivity disorder, unspecified type: Secondary | ICD-10-CM

## 2023-07-28 NOTE — Telephone Encounter (Signed)
 Requested Prescriptions   Pending Prescriptions Disp Refills   ADDERALL  XR 30 MG 24 hr capsule [Pharmacy Med Name: Adderall  XR 30 mg capsule,extended release] 30 capsule 0    Sig: Take 1 capsule (30 mg total) by mouth daily.   Last seen 11/29/21 Next appt not scheduled  Pt needs an appt for further refills.  Dispenses   Dispensed Days Supply Quantity Provider Pharmacy  Adderall  XR 30 mg capsule,extended release 06/16/2023 12 12 each Glory Larsen, MD [facility]...  ADDERALL  XR  30 MG CP24 05/08/2023 30 30 capsule Glory Larsen, MD [facility]  ADDERALL  XR  30 MG CP24 03/30/2023 30 30 capsule Glory Larsen, MD [facility]...  ADDERALL  XR  30 MG CP24 02/20/2023 30 30 capsule Glory Larsen, MD [facility]...  ADDERALL  XR  30 MG CP24 01/16/2023 30 30 capsule Glory Larsen, MD [facility]...  ADDERALL  XR  30 MG CP24 11/22/2022 30 30 capsule Glory Larsen, MD [facility]  ADDERALL  XR  30 MG CP24 10/03/2022 30 30 capsule Glory Larsen, MD [facility].Aaron AasAaron Aas

## 2023-07-30 NOTE — Telephone Encounter (Signed)
 I placed GXT attestation. Can you please look into billing for her

## 2023-07-31 ENCOUNTER — Telehealth (HOSPITAL_COMMUNITY): Payer: Self-pay

## 2023-07-31 NOTE — Telephone Encounter (Signed)
 Reed Canes to Me KG   07/31/23  8:19 AM She should be able to pay self pay, being that her insurance won't cover it. Self pay gets a 65% off discount. When she registers, just let them know

## 2023-07-31 NOTE — Telephone Encounter (Signed)
 Detailed instructions left on the patient's answering machine. Martha Vincent CCT

## 2023-08-01 ENCOUNTER — Ambulatory Visit (HOSPITAL_COMMUNITY): Payer: PRIVATE HEALTH INSURANCE | Attending: Cardiovascular Disease

## 2023-08-01 DIAGNOSIS — I4729 Other ventricular tachycardia: Secondary | ICD-10-CM | POA: Diagnosis present

## 2023-08-01 DIAGNOSIS — R002 Palpitations: Secondary | ICD-10-CM | POA: Insufficient documentation

## 2023-08-01 DIAGNOSIS — I10 Essential (primary) hypertension: Secondary | ICD-10-CM | POA: Diagnosis not present

## 2023-08-01 LAB — EXERCISE TOLERANCE TEST
Angina Index: 0
Duke Treadmill Score: 6
Estimated workload: 7.5
Exercise duration (min): 6 min
Exercise duration (sec): 21 s
MPHR: 162 {beats}/min
Peak HR: 162 {beats}/min
Percent HR: 100 %
Rest HR: 88 {beats}/min
ST Depression (mm): 0 mm

## 2023-08-02 ENCOUNTER — Other Ambulatory Visit: Payer: Self-pay | Admitting: Cardiology

## 2023-08-02 ENCOUNTER — Telehealth: Payer: Self-pay | Admitting: Cardiology

## 2023-08-02 DIAGNOSIS — I10 Essential (primary) hypertension: Secondary | ICD-10-CM

## 2023-08-02 DIAGNOSIS — R002 Palpitations: Secondary | ICD-10-CM

## 2023-08-02 MED ORDER — OLMESARTAN MEDOXOMIL 20 MG PO TABS: 20.0000 mg | ORAL_TABLET | Freq: Every evening | ORAL | 3 refills | Status: DC

## 2023-08-02 MED ORDER — VERAPAMIL HCL ER 180 MG PO CP24: 180.0000 mg | ORAL_CAPSULE | Freq: Two times a day (BID) | ORAL | 3 refills | Status: DC

## 2023-08-02 NOTE — Telephone Encounter (Signed)
 Due to palpitations and uncontrolled hypertension, I started her on olmesartan  HCT and increased her verapamil  to 180 mg twice daily, patient underwent cardiac MRI in view of NSVT with polymorphic morphology noted on extended EKG monitoring.  Cardiac MRI is completely normal, normal variant lipomatous hypertrophy of the interatrial septum was evident, normal EF and no LGE noted.  Essentially normal cardiac MRI.  GXT revealed excellent heart rate response, she did not elicit any arrhythmias, no frequent PVCs, no complex arrhythmias, no evidence of ischemia by CT criteria.  In view of this, very low risk for sudden cardiac death or significant cardiac arrhythmia issues.  Patient also states that since being on verapamil  and olmesartan  combination, her blood pressure has been very well-controlled and she has not had any further palpitations and has been exercising and has resumed all her activities without symptoms.  Hence in the absence of any family history of sudden cardiac death, no other significant abnormality, normal QT interval on the EKG, no inducible arrhythmias by stress testing, I have refilled her prescriptions and she can follow-up with her PCP for further management and evaluation.  I will see her back on a as needed basis.

## 2023-08-18 ENCOUNTER — Other Ambulatory Visit: Payer: Self-pay | Admitting: Neurology

## 2023-08-18 MED ORDER — MELOXICAM 15 MG PO TABS: 15.0000 mg | ORAL_TABLET | Freq: Every day | ORAL | 4 refills | Status: AC

## 2023-08-18 MED ORDER — DIAZEPAM 10 MG PO TABS: 10.0000 mg | ORAL_TABLET | Freq: Four times a day (QID) | ORAL | 4 refills | Status: AC | PRN

## 2023-08-18 MED ORDER — METHYLPREDNISOLONE 4 MG PO TBPK: ORAL_TABLET | ORAL | 1 refills | Status: DC

## 2023-09-04 ENCOUNTER — Other Ambulatory Visit: Payer: Self-pay | Admitting: Neurology

## 2023-09-04 DIAGNOSIS — F909 Attention-deficit hyperactivity disorder, unspecified type: Secondary | ICD-10-CM

## 2023-09-04 DIAGNOSIS — F900 Attention-deficit hyperactivity disorder, predominantly inattentive type: Secondary | ICD-10-CM

## 2023-09-04 MED ORDER — ADDERALL XR 30 MG PO CP24
30.0000 mg | ORAL_CAPSULE | Freq: Every day | ORAL | 0 refills | Status: DC
Start: 2023-09-04 — End: 2023-10-25

## 2023-10-21 ENCOUNTER — Other Ambulatory Visit: Payer: Self-pay | Admitting: Neurology

## 2023-10-21 DIAGNOSIS — F909 Attention-deficit hyperactivity disorder, unspecified type: Secondary | ICD-10-CM

## 2023-10-21 DIAGNOSIS — F900 Attention-deficit hyperactivity disorder, predominantly inattentive type: Secondary | ICD-10-CM

## 2023-12-01 ENCOUNTER — Other Ambulatory Visit: Payer: Self-pay | Admitting: Neurology

## 2023-12-01 DIAGNOSIS — F900 Attention-deficit hyperactivity disorder, predominantly inattentive type: Secondary | ICD-10-CM

## 2023-12-01 DIAGNOSIS — F909 Attention-deficit hyperactivity disorder, unspecified type: Secondary | ICD-10-CM

## 2023-12-01 MED ORDER — ADDERALL XR 30 MG PO CP24
30.0000 mg | ORAL_CAPSULE | Freq: Every day | ORAL | 0 refills | Status: AC
Start: 2023-12-01 — End: ?

## 2024-02-22 ENCOUNTER — Encounter: Payer: Self-pay | Admitting: Cardiology

## 2024-02-22 DIAGNOSIS — R002 Palpitations: Secondary | ICD-10-CM

## 2024-02-22 DIAGNOSIS — I10 Essential (primary) hypertension: Secondary | ICD-10-CM

## 2024-02-23 MED ORDER — OLMESARTAN MEDOXOMIL 20 MG PO TABS: 20.0000 mg | ORAL_TABLET | Freq: Every evening | ORAL | 0 refills | Status: AC

## 2024-02-23 MED ORDER — VERAPAMIL HCL ER 180 MG PO CP24: 180.0000 mg | ORAL_CAPSULE | Freq: Two times a day (BID) | ORAL | 0 refills | Status: AC

## 2024-02-28 ENCOUNTER — Encounter (HOSPITAL_BASED_OUTPATIENT_CLINIC_OR_DEPARTMENT_OTHER): Payer: Self-pay | Admitting: General Practice

## 2024-03-01 ENCOUNTER — Encounter (HOSPITAL_BASED_OUTPATIENT_CLINIC_OR_DEPARTMENT_OTHER): Payer: PRIVATE HEALTH INSURANCE | Admitting: Obstetrics & Gynecology

## 2024-04-18 ENCOUNTER — Ambulatory Visit (INDEPENDENT_AMBULATORY_CARE_PROVIDER_SITE_OTHER): Payer: PRIVATE HEALTH INSURANCE | Admitting: Obstetrics & Gynecology

## 2024-04-18 ENCOUNTER — Other Ambulatory Visit (HOSPITAL_COMMUNITY)
Admission: RE | Admit: 2024-04-18 | Discharge: 2024-04-18 | Disposition: A | Source: Ambulatory Visit | Attending: Obstetrics & Gynecology | Admitting: Obstetrics & Gynecology

## 2024-04-18 ENCOUNTER — Encounter (HOSPITAL_BASED_OUTPATIENT_CLINIC_OR_DEPARTMENT_OTHER): Payer: Self-pay | Admitting: Obstetrics & Gynecology

## 2024-04-18 VITALS — BP 132/87 | HR 76

## 2024-04-18 DIAGNOSIS — Z789 Other specified health status: Secondary | ICD-10-CM | POA: Diagnosis not present

## 2024-04-18 DIAGNOSIS — N841 Polyp of cervix uteri: Secondary | ICD-10-CM | POA: Diagnosis present

## 2024-04-18 DIAGNOSIS — N393 Stress incontinence (female) (male): Secondary | ICD-10-CM

## 2024-04-18 DIAGNOSIS — Z79890 Hormone replacement therapy (postmenopausal): Secondary | ICD-10-CM

## 2024-04-18 DIAGNOSIS — Z124 Encounter for screening for malignant neoplasm of cervix: Secondary | ICD-10-CM

## 2024-04-18 DIAGNOSIS — Z01419 Encounter for gynecological examination (general) (routine) without abnormal findings: Secondary | ICD-10-CM | POA: Diagnosis not present

## 2024-04-18 DIAGNOSIS — Z1331 Encounter for screening for depression: Secondary | ICD-10-CM

## 2024-04-18 MED ORDER — NONFORMULARY OR COMPOUNDED ITEM: 1 refills | Status: AC

## 2024-04-18 MED ORDER — ESTRADIOL 0.0375 MG/24HR TD PTTW: 1.0000 | MEDICATED_PATCH | TRANSDERMAL | 3 refills | Status: AC

## 2024-04-18 MED ORDER — PROGESTERONE MICRONIZED 100 MG PO CAPS: 100.0000 mg | ORAL_CAPSULE | Freq: Every day | ORAL | 3 refills | Status: AC

## 2024-04-18 MED ORDER — ESTRADIOL 0.01 % VA CREA: TOPICAL_CREAM | VAGINAL | 6 refills | Status: AC

## 2024-04-19 LAB — RUBEOLA ANTIBODY IGG: RUBEOLA AB, IGG: >300 [AU]/ml

## 2024-04-20 ENCOUNTER — Ambulatory Visit (HOSPITAL_BASED_OUTPATIENT_CLINIC_OR_DEPARTMENT_OTHER): Payer: Self-pay | Admitting: Obstetrics & Gynecology

## 2024-04-22 LAB — CYTOLOGY - PAP
Comment: NEGATIVE
Diagnosis: NEGATIVE
High risk HPV: NEGATIVE

## 2024-04-22 LAB — SURGICAL PATHOLOGY

## 2024-05-29 ENCOUNTER — Encounter (HOSPITAL_BASED_OUTPATIENT_CLINIC_OR_DEPARTMENT_OTHER): Payer: PRIVATE HEALTH INSURANCE | Admitting: Obstetrics & Gynecology
